# Patient Record
Sex: Female | Born: 1955
Health system: Southern US, Community
[De-identification: ages and names within clinical notes are randomized; demographics above are authoritative.]

## PROBLEM LIST (undated history)

## (undated) DIAGNOSIS — E039 Hypothyroidism, unspecified: Secondary | ICD-10-CM

## (undated) DIAGNOSIS — Z923 Personal history of irradiation: Secondary | ICD-10-CM

## (undated) DIAGNOSIS — M1990 Unspecified osteoarthritis, unspecified site: Secondary | ICD-10-CM

## (undated) DIAGNOSIS — G709 Myoneural disorder, unspecified: Secondary | ICD-10-CM

## (undated) DIAGNOSIS — T451X5A Adverse effect of antineoplastic and immunosuppressive drugs, initial encounter: Secondary | ICD-10-CM

## (undated) DIAGNOSIS — E876 Hypokalemia: Secondary | ICD-10-CM

## (undated) DIAGNOSIS — E119 Type 2 diabetes mellitus without complications: Secondary | ICD-10-CM

## (undated) DIAGNOSIS — D6481 Anemia due to antineoplastic chemotherapy: Secondary | ICD-10-CM

## (undated) DIAGNOSIS — G629 Polyneuropathy, unspecified: Secondary | ICD-10-CM

## (undated) DIAGNOSIS — T7840XA Allergy, unspecified, initial encounter: Secondary | ICD-10-CM

## (undated) DIAGNOSIS — F419 Anxiety disorder, unspecified: Secondary | ICD-10-CM

## (undated) DIAGNOSIS — Z800 Family history of malignant neoplasm of digestive organs: Secondary | ICD-10-CM

## (undated) DIAGNOSIS — Z9221 Personal history of antineoplastic chemotherapy: Secondary | ICD-10-CM

## (undated) DIAGNOSIS — C50919 Malignant neoplasm of unspecified site of unspecified female breast: Secondary | ICD-10-CM

## (undated) DIAGNOSIS — Z9289 Personal history of other medical treatment: Secondary | ICD-10-CM

## (undated) HISTORY — PX: DILATION AND CURETTAGE OF UTERUS: SHX78

## (undated) HISTORY — DX: Adverse effect of antineoplastic and immunosuppressive drugs, initial encounter: D64.81

## (undated) HISTORY — DX: Allergy, unspecified, initial encounter: T78.40XA

## (undated) HISTORY — DX: Hypokalemia: E87.6

## (undated) HISTORY — DX: Myoneural disorder, unspecified: G70.9

## (undated) HISTORY — PX: WISDOM TOOTH EXTRACTION: SHX21

## (undated) HISTORY — PX: BREAST LUMPECTOMY: SHX2

## (undated) HISTORY — DX: Anemia due to antineoplastic chemotherapy: T45.1X5A

## (undated) HISTORY — DX: Personal history of irradiation: Z92.3

## (undated) HISTORY — DX: Family history of malignant neoplasm of digestive organs: Z80.0

## (undated) HISTORY — PX: DIAGNOSTIC LAPAROSCOPY: SUR761

---
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
## 1998-03-18 ENCOUNTER — Ambulatory Visit (HOSPITAL_COMMUNITY): Admission: RE | Admit: 1998-03-18 | Discharge: 1998-03-18 | Payer: Self-pay | Admitting: Gynecology

## 1998-03-25 ENCOUNTER — Ambulatory Visit (HOSPITAL_COMMUNITY): Admission: RE | Admit: 1998-03-25 | Discharge: 1998-03-25 | Payer: Self-pay | Admitting: Gynecology

## 1998-05-23 ENCOUNTER — Ambulatory Visit (HOSPITAL_COMMUNITY): Admission: RE | Admit: 1998-05-23 | Discharge: 1998-05-23 | Payer: Self-pay | Admitting: Gynecology

## 1998-07-01 ENCOUNTER — Ambulatory Visit (HOSPITAL_COMMUNITY): Admission: RE | Admit: 1998-07-01 | Discharge: 1998-07-01 | Payer: Self-pay | Admitting: Gynecology

## 1998-11-21 ENCOUNTER — Other Ambulatory Visit: Admission: RE | Admit: 1998-11-21 | Discharge: 1998-11-21 | Payer: Self-pay | Admitting: Obstetrics and Gynecology

## 1999-03-30 ENCOUNTER — Ambulatory Visit (HOSPITAL_COMMUNITY): Admission: RE | Admit: 1999-03-30 | Discharge: 1999-03-30 | Payer: Self-pay | Admitting: Obstetrics and Gynecology

## 1999-03-30 ENCOUNTER — Encounter: Payer: Self-pay | Admitting: Obstetrics and Gynecology

## 1999-04-06 ENCOUNTER — Inpatient Hospital Stay (HOSPITAL_COMMUNITY): Admission: AD | Admit: 1999-04-06 | Discharge: 1999-04-06 | Payer: Self-pay | Admitting: Obstetrics and Gynecology

## 1999-04-07 ENCOUNTER — Ambulatory Visit (HOSPITAL_COMMUNITY): Admission: RE | Admit: 1999-04-07 | Discharge: 1999-04-07 | Payer: Self-pay | Admitting: Obstetrics and Gynecology

## 1999-04-07 ENCOUNTER — Encounter: Payer: Self-pay | Admitting: Obstetrics and Gynecology

## 1999-05-04 ENCOUNTER — Inpatient Hospital Stay (HOSPITAL_COMMUNITY): Admission: AD | Admit: 1999-05-04 | Discharge: 1999-05-08 | Payer: Self-pay | Admitting: *Deleted

## 1999-05-11 ENCOUNTER — Encounter: Admission: RE | Admit: 1999-05-11 | Discharge: 1999-08-09 | Payer: Self-pay | Admitting: Obstetrics and Gynecology

## 1999-06-19 ENCOUNTER — Other Ambulatory Visit: Admission: RE | Admit: 1999-06-19 | Discharge: 1999-06-19 | Payer: Self-pay | Admitting: Obstetrics and Gynecology

## 1999-08-16 ENCOUNTER — Encounter: Admission: RE | Admit: 1999-08-16 | Discharge: 1999-11-14 | Payer: Self-pay | Admitting: Obstetrics and Gynecology

## 2000-01-24 ENCOUNTER — Encounter: Admission: RE | Admit: 2000-01-24 | Discharge: 2000-01-24 | Payer: Self-pay | Admitting: Gynecology

## 2000-01-24 ENCOUNTER — Encounter: Payer: Self-pay | Admitting: Gynecology

## 2001-02-25 ENCOUNTER — Other Ambulatory Visit: Admission: RE | Admit: 2001-02-25 | Discharge: 2001-02-25 | Payer: Self-pay | Admitting: Obstetrics and Gynecology

## 2001-03-09 ENCOUNTER — Other Ambulatory Visit: Admission: RE | Admit: 2001-03-09 | Discharge: 2001-03-09 | Payer: Self-pay | Admitting: Obstetrics and Gynecology

## 2002-04-21 ENCOUNTER — Other Ambulatory Visit: Admission: RE | Admit: 2002-04-21 | Discharge: 2002-04-21 | Payer: Self-pay | Admitting: Obstetrics and Gynecology

## 2005-01-15 ENCOUNTER — Other Ambulatory Visit: Admission: RE | Admit: 2005-01-15 | Discharge: 2005-01-15 | Payer: Self-pay | Admitting: Obstetrics and Gynecology

## 2010-06-17 ENCOUNTER — Encounter: Payer: Self-pay | Admitting: Obstetrics and Gynecology

## 2011-05-28 HISTORY — PX: COLONOSCOPY: SHX174

## 2012-05-27 HISTORY — PX: BREAST BIOPSY: SHX20

## 2013-04-13 DIAGNOSIS — Z124 Encounter for screening for malignant neoplasm of cervix: Secondary | ICD-10-CM | POA: Insufficient documentation

## 2013-04-19 ENCOUNTER — Other Ambulatory Visit: Payer: Self-pay | Admitting: Obstetrics and Gynecology

## 2013-04-19 DIAGNOSIS — R928 Other abnormal and inconclusive findings on diagnostic imaging of breast: Secondary | ICD-10-CM

## 2013-04-26 ENCOUNTER — Ambulatory Visit
Admission: RE | Admit: 2013-04-26 | Discharge: 2013-04-26 | Disposition: A | Discharge status: Home / Self Care | Payer: BC Managed Care – PPO | Source: Ambulatory Visit | Attending: Obstetrics and Gynecology | Admitting: Obstetrics and Gynecology

## 2013-04-26 ENCOUNTER — Other Ambulatory Visit: Payer: Self-pay | Admitting: Obstetrics and Gynecology

## 2013-04-26 DIAGNOSIS — R928 Other abnormal and inconclusive findings on diagnostic imaging of breast: Secondary | ICD-10-CM

## 2013-06-03 ENCOUNTER — Ambulatory Visit
Admission: RE | Admit: 2013-06-03 | Discharge: 2013-06-03 | Disposition: A | Discharge status: Home / Self Care | Payer: BC Managed Care – PPO | Source: Ambulatory Visit | Attending: Obstetrics and Gynecology | Admitting: Obstetrics and Gynecology

## 2013-06-03 ENCOUNTER — Other Ambulatory Visit: Payer: Self-pay | Admitting: Obstetrics and Gynecology

## 2013-06-03 DIAGNOSIS — R928 Other abnormal and inconclusive findings on diagnostic imaging of breast: Secondary | ICD-10-CM

## 2013-06-03 DIAGNOSIS — C50919 Malignant neoplasm of unspecified site of unspecified female breast: Secondary | ICD-10-CM | POA: Insufficient documentation

## 2013-06-03 HISTORY — DX: Malignant neoplasm of unspecified site of unspecified female breast: C50.919

## 2013-06-04 ENCOUNTER — Other Ambulatory Visit: Payer: Self-pay | Admitting: Obstetrics and Gynecology

## 2013-06-04 DIAGNOSIS — C801 Malignant (primary) neoplasm, unspecified: Secondary | ICD-10-CM

## 2013-06-08 ENCOUNTER — Ambulatory Visit
Admission: RE | Admit: 2013-06-08 | Discharge: 2013-06-08 | Disposition: A | Discharge status: Home / Self Care | Payer: BC Managed Care – PPO | Source: Ambulatory Visit | Attending: Obstetrics and Gynecology | Admitting: Obstetrics and Gynecology

## 2013-06-08 DIAGNOSIS — C801 Malignant (primary) neoplasm, unspecified: Secondary | ICD-10-CM

## 2013-06-08 MED ORDER — GADOBENATE DIMEGLUMINE 529 MG/ML IV SOLN
20.0000 mL | Freq: Once | INTRAVENOUS | Status: AC | PRN
  Administered 2013-06-08: 20 mL via INTRAVENOUS

## 2013-06-11 ENCOUNTER — Encounter (INDEPENDENT_AMBULATORY_CARE_PROVIDER_SITE_OTHER): Payer: Self-pay | Admitting: Surgery

## 2013-06-11 ENCOUNTER — Other Ambulatory Visit (INDEPENDENT_AMBULATORY_CARE_PROVIDER_SITE_OTHER): Payer: Self-pay

## 2013-06-11 ENCOUNTER — Ambulatory Visit (INDEPENDENT_AMBULATORY_CARE_PROVIDER_SITE_OTHER): Payer: BC Managed Care – PPO | Admitting: Surgery

## 2013-06-11 ENCOUNTER — Encounter (INDEPENDENT_AMBULATORY_CARE_PROVIDER_SITE_OTHER): Payer: Self-pay

## 2013-06-11 VITALS — BP 142/88 | HR 107 | Temp 98.0°F | Resp 18 | Ht 68.0 in | Wt 218.0 lb

## 2013-06-11 DIAGNOSIS — D0590 Unspecified type of carcinoma in situ of unspecified breast: Secondary | ICD-10-CM

## 2013-06-11 DIAGNOSIS — C50919 Malignant neoplasm of unspecified site of unspecified female breast: Secondary | ICD-10-CM

## 2013-06-11 DIAGNOSIS — C50912 Malignant neoplasm of unspecified site of left female breast: Secondary | ICD-10-CM | POA: Insufficient documentation

## 2013-06-11 NOTE — Progress Notes (Addendum)
Re:   Lori Vincent DOB:   1956/03/12 MRN:   [id number]  ASSESSMENT AND PLAN: 1.  Left breast cancer, 6 o'clock (T2, N0)  Invasive ductal carcinoma on biopsy, ER - 97%, PR - 75%, Ki67 - 44%, Her2Neu - Positive  MRI - 06/08/2013 - 2.9 x 2.1 x1.9 cm  I discussed the options for breast cancer treatment with the patient.  I discussed a multidisciplinary approach to the treatment of breast cancer, which includes medical oncology and radiation oncology.  I discussed the surgical options of lumpectomy vs. mastectomy.  If mastectomy, there is the possibility of reconstruction.   I discussed the options of lymph node biopsy.  The treatment plan depends on the pathologic staging of the tumor and the patient's personal wishes.  The risks of surgery include, but are not limited to, bleeding, infection, the need for further surgery, and nerve injury.  The patient has been given literature on the treatment of breast cancer.  Since she is Her2Neu positive - I want her to see medical oncology first.  The patient has requested Dr. Jana Hakim.  Will also have her see radiation oncology, so she has a complete picture of her planned treatment.  There are a lot of overlying issues/questions since her husband died of metastatic sarcoma.  Plan: 1) Medical oncology consult to confirm planned chemotherapy, 2) Lumpectomy (for wire loc-requested Dr. Joneen Caraway if possible), left axillary sentinel lymph node biopsy, power port placement, 3) Chemotherapy (this could be done as neoadjuvant if everyone thought that is in her best interest), 4) Radiation therapy. [Seen by Dr. Glendora Score. [name] - 06/24/2013  DN] Lori Vincent is going to  [place] with a friend on 2/11 - so we have moved her surgery to 07/26/2013.  Discussed with patient.  DN 07/01/2013]  1A.  Probable chemotherapy - I discussed power port placement at the same time as the lumpectomy.  The risks include bleeding, infection, pneumothorax, and thrombosis.  2.  On thyroid replacement 3.   History of depression Lori Vincent has some moles below her right breast that she wants removed - I can do that at the same time as her breast surgery. DN  06/23/2013]  Chief Complaint  Patient presents with  . New Evaluation    left br ca   REFERRING PHYSICIAN: [name], NP  HISTORY OF PRESENT ILLNESS: Lori Vincent is a 58 y.o. (DOB: [birth year])  white  female whose primary care physician is CURTIS,CAROL G, NP and comes to me today for left breast cancer. She comes by herself.  [name] went for her annual mammogram on 04/26/2013.  She also had an [place] on 04/26/2013.  The report showed "two adjacent suspicious masses in the 6 o'clock position of the left breast."  For financial reasons, the patient waited until 06/04/2013 to have a biopsy of her left breast.  The path report of the left breast biopsy (Accession: [id number]) - 06/03/2013 - Invasive ductal carcinoma with DCIS.  She is not on hormone.  Her last period was about 1 year ago.  She has no family history of breast cancer.  She could not feel this mass, even after it was found on mammogram/US.   MRI - 06/08/2013 - shows Irregular enhancing mass within the left breast compatible with  recently biopsy proven invasive ductal carcinoma. Extending along the posterior medial margin of this mass are 2 adjacent irregular masses when measured in total extending 2.9 cm. As these masses are  immediately adjacent to each other, they  likely can be included in single surgical specimen.   No past medical history on file.   Past Surgical History  Procedure Laterality Date  . Tonsillectomy  1972  . Cesarean section  04/1999     Current Outpatient Prescriptions  Medication Sig Dispense Refill  . citalopram (CELEXA) 10 MG tablet       . etodolac (LODINE) 400 MG tablet Take 400 mg by mouth 2 (two) times daily.      Marland Kitchen SYNTHROID 125 MCG tablet        No current facility-administered medications for this visit.     Allergies  Allergen Reactions  .  Aloe    REVIEW OF SYSTEMS: Skin:  No history of rash.  No history of abnormal moles. Infection:  No history of hepatitis or HIV.  No history of MRSA. Neurologic:  No history of stroke.  No history of seizure.  No history of headaches. Cardiac:  No history of hypertension. No history of heart disease.  No history of prior cardiac catheterization.  No history of seeing a cardiologist. Pulmonary:  Does not smoke cigarettes.  No asthma or bronchitis.  No OSA/CPAP.  Endocrine:  No diabetes. Hypothyroid - has seen Dr. Kristeen Mans recently to adjust her thyroid doses. Gastrointestinal:  No history of stomach disease.  No history of liver disease.  No history of gall bladder disease.  No history of pancreas disease.  No history of colon disease. Urologic:  No history of kidney stones.  No history of bladder infections. Musculoskeletal:  No history of joint or back disease. Hematologic:  No bleeding disorder.  No history of anemia.  Not anticoagulated. Psycho-social:  The patient is oriented.   The patient has no obvious psychologic or social impairment to understanding our conversation and plan.  SOCIAL and FAMILY HISTORY: Husband died July 23, 2009 secondary to leg sarcoma.  He was taken care over at [facility]. She has one daughter age 56.  Has two step children at college. 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    PHYSICAL EXAM: BP 142/88  Pulse 107  Temp(Src) 98 F (36.7 C)  Resp 18  Ht _0  (1.727 m)  Wt 218 lb (98.884 kg)  BMI 33.15 kg/m2  General: WN WF  who is alert and generally healthy appearing.  HEENT: Normal. Pupils equal. Neck: Supple. No mass.  No thyroid mass. Lymph Nodes:  No supraclavicular, cervical, or axillary adenopathy. Breasts:  Right - no mass  Left - bruise at 6 o'clock.  No mass. Lungs: Clear to auscultation and symmetric breath sounds. Heart:  RRR. No murmur or rub. Abdomen: Soft. No mass. No tenderness. No hernia. Normal bowel sounds.  No abdominal  scars. Rectal: Not done. Extremities:  Good strength and ROM  in upper and lower extremities. Neurologic:  Grossly intact to motor and sensory function. Psychiatric: Has normal mood and affect. Behavior is normal.   DATA REVIEWED: Notes in Epic.  [name], MD,  [facility], [address]  [place], [place]    [place] Phone:  [phone] FAX:  [phone]

## 2013-06-14 ENCOUNTER — Telehealth: Payer: Self-pay | Admitting: *Deleted

## 2013-06-14 ENCOUNTER — Encounter: Payer: Self-pay | Admitting: *Deleted

## 2013-06-14 NOTE — Telephone Encounter (Signed)
Pt returned my call and I confirmed 06/15/13 appt w/ her.  Could not schedule Dr. Virgie Dad appt - emailed Audie Clear to enter it.  Emailed Glenda at [facility] to make her aware.  Made chart.

## 2013-06-14 NOTE — Telephone Encounter (Signed)
Received an appt from Dr. Jana Hakim.  Called and left message for pt to return my call so I can get her scheduled.

## 2013-06-14 NOTE — Progress Notes (Signed)
Completed chart, added to spreadsheet and placed in Dr. Virgie Dad box.

## 2013-06-15 ENCOUNTER — Ambulatory Visit: Payer: BC Managed Care – PPO

## 2013-06-15 ENCOUNTER — Ambulatory Visit (HOSPITAL_BASED_OUTPATIENT_CLINIC_OR_DEPARTMENT_OTHER): Payer: BC Managed Care – PPO | Admitting: Oncology

## 2013-06-15 ENCOUNTER — Encounter: Payer: Self-pay | Admitting: Oncology

## 2013-06-15 VITALS — BP 168/89 | HR 97 | Temp 98.4°F | Resp 20 | Ht 68.0 in | Wt 218.2 lb

## 2013-06-15 DIAGNOSIS — Z170 Estrogen receptor positive status [ER+]: Secondary | ICD-10-CM

## 2013-06-15 DIAGNOSIS — C50512 Malignant neoplasm of lower-outer quadrant of left female breast: Secondary | ICD-10-CM

## 2013-06-15 DIAGNOSIS — C50919 Malignant neoplasm of unspecified site of unspecified female breast: Secondary | ICD-10-CM

## 2013-06-15 NOTE — Progress Notes (Signed)
Checked in new patient with no financial issues. She has appt card. °

## 2013-06-18 ENCOUNTER — Encounter: Payer: Self-pay | Admitting: Radiation Oncology

## 2013-06-19 DIAGNOSIS — C50512 Malignant neoplasm of lower-outer quadrant of left female breast: Secondary | ICD-10-CM | POA: Insufficient documentation

## 2013-06-19 DIAGNOSIS — Z170 Estrogen receptor positive status [ER+]: Secondary | ICD-10-CM

## 2013-06-19 NOTE — Progress Notes (Signed)
[place]  Telephone:(336) [phone] Fax:(336) [phone]     ID: Lori Vincent OB: Apr 26, 1956  MR#: [id number]  QJJ#:[id number]  PCP: [name], NP GYN:  [name] SU: Lori Vincent OTHER MD: Arloa Koh  CHIEF COMPLAINT: "They found I had breast cancer".  HISTORY OF PRESENT ILLNESS: L[facility] Vincent [facility] showing a possible mass in the left breast. Diagnostic left mammography and ultrasonography at the breast center 04/26/2013 confirmed an oval mass with microcalcifications in the 6:00 position of the left breast. There was an adjacent partially obscured 1.2 cm mass. On physical exam there was no palpable mass in the left breast. By ultrasonography, there was an oval mass at the 6:00 position measuring 1.4 cm, with a second mass measuring 1.1 cm. There were no abnormal lymph nodes noted in the left axilla.  Biopsy of the 6:00 mass in the left breast January 2015 showed ([id number]) and invasive ductal carcinoma, grade 3, estrogen receptor 97% positive, progesterone receptor 75% positive, with strong staining intensity, and MIB-144%. HER-2 was amplified, with the signals ratio 4.18 and in number per cell of 9.40 .  On 06/08/2013 the patient underwent bilateral breast MRI which showed a dumbbell-shaped area in the central left breast consisting of a 2 cm mass and 2 extensions or an adjacent masses, measuring a total of 2.9 cm. There were no abnormal appearing lymph nodes for any other areas of concern.  The patient's subsequent history is as detailed below.   INTERVAL HISTORY: Lori Vincent was evaluated in the breast clinic 06/15/2013. Her case was also discussed in the multidisciplinary breast cancer conference 06/09/2013  REVIEW OF SYSTEMS: There were no specific symptoms leading to the original mammogram, which was routinely scheduled. The patient denies unusual headaches, visual changes, nausea, vomiting, stiff  neck, dizziness, or gait imbalance. There has been no cough, phlegm production, or pleurisy, no chest pain or pressure, and no change in bowel or bladder habits. The patient denies fever, rash, bleeding, unexplained fatigue or unexplained weight loss. She does complain of mild insomnia, mild hot flashes, and mild arthritis symptoms. No these problems are more intense or frequent than before. A detailed review of systems was otherwise entirely negative.  PAST MEDICAL HISTORY: Past Medical History  Diagnosis Date  . Breast cancer 06/03/13    left    PAST SURGICAL HISTORY: Past Surgical History  Procedure Laterality Date  . Tonsillectomy  1972  . Cesarean section  04/1999    FAMILY HISTORY Family History  Problem Relation Age of Onset  . Stroke Mother   . Diabetes Father   . Cancer Maternal Grandmother     colon   the patient's father died at the age of 26 in a car accident. The patient's mother died at the age of 7 from a stroke. The patient has one adopted brother. She has one biological sister. The patient's mother's mother was diagnosed with colon cancer at the age of 14. There is no history of breast oropharynx cancer in the family to the patient's knowledge.  GYNECOLOGIC HISTORY:  Menarche age 55. First live birth age 61. The patient is GX P1. She took birth control pills for approximately 12 years remotely. She had a period in February of 2014 and then in December of 2014. She had been started on hormone replacement in December. That has since been discontinued.  SOCIAL HISTORY:  Lori Vincent used to do well a. 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 Her husband died at [place] 3 years ago with soft tissue sarcoma. The patient significant other is the past. He works as a Geophysicist/field seismologist. At home it is just the patient and her daughter 77, 46 years old. The patient has 2 stepchildren from her earlier marriage. 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  ADVANCED DIRECTIVES: Not in place   HEALTH MAINTENANCE: History  Substance Use Topics  . Smoking status: Never Smoker   . Smokeless tobacco: Not on file  . Alcohol Use: Not on file     Colonoscopy: July 2014  PAP: November 2014  Bone density: November 2014  Lipid panel:  Allergies  Allergen Reactions  . Aloe   . No Known Drug Allergy     Current Outpatient Prescriptions  Medication Sig Dispense Refill  . citalopram (CELEXA) 10 MG tablet       . etodolac (LODINE) 400 MG tablet Take 400 mg by mouth 2 (two) times daily.      . Multiple Vitamins-Minerals (CENTRUM SILVER PO) Take 1 tablet by mouth daily.      Marland Kitchen SYNTHROID 125 MCG tablet        No current facility-administered medications for this visit.    OBJECTIVE: Middle-aged white woman in no acute distress Filed Vitals:   06/15/13 1650  BP: 168/89  Pulse: 97  Temp: 98.4 F (36.9 C)  Resp: 20     Body mass index is 33.18 kg/(m^2).    ECOG FS:0 - Asymptomatic  Ocular: Sclerae unicteric, pupils equal, round and reactive to light Ear-nose-throat: Oropharynx clear, no thrush or other lesions Lymphatic: No cervical or supraclavicular adenopathy Lungs no rales or rhonchi, good excursion bilaterally Heart regular rate and rhythm, no murmur appreciated Abd soft, nontender, positive bowel sounds MSK no focal spinal tenderness, no joint edema Neuro: non-focal, well-oriented, appropriate affect Breasts: The right breast is unremarkable. The left breast is status post recent biopsy. There is a mild ecchymosis. I do not palpate a well-defined mass. The left axilla is benign.   LAB RESULTS:  CMP  No results found for this basename: na, k, cl, co2, glucose, bun, creatinine, calcium, prot, albumin, ast, alt, alkphos, bilitot, gfrnonaa, gfraa    I No results found for this basename: SPEP, UPEP,  kappa and lambda light chains    No results found for  this basename: WBC, NEUTROABS, HGB, HCT, MCV, PLT      Chemistry   No results found for this basename: NA, K, CL, CO2, BUN, CREATININE, GLU   No results found for this basename: CALCIUM, ALKPHOS, AST, ALT, BILITOT       No results found for this basename: LABCA2    No components found with this basename: LABCA125    No results found for this basename: INR,  in the last 168 hours  Urinalysis No results found for this basename: colorurine, appearanceur, labspec, phurine, glucoseu, hgbur, bilirubinur, ketonesur, proteinur, urobilinogen, nitrite, leukocytesur    STUDIES: Mr Breast Bilateral W Wo Contrast  06/08/2013   CLINICAL DATA:  Patient with recent diagnosis of left breast invasive ductal carcinoma and DCIS.  EXAM: BILATERAL BREAST MRI WITH AND WITHOUT CONTRAST  LABS:  BUN and creatinine were obtained on site at [facility] at  [address]  Results:  BUN 16 mg/dL,  Creatinine 0.9 mg/dL.  TECHNIQUE: Multiplanar, multisequence MR images of both breasts were obtained prior to and  following the intravenous administration of 88m of MultiHance.  THREE-DIMENSIONAL MR IMAGE RENDERING ON INDEPENDENT WORKSTATION:  Three-dimensional MR images were rendered by post-processing of the original MR data on an independent workstation. The three-dimensional MR images were interpreted, and findings are reported in the following complete MRI report for this study. Three dimensional images were evaluated at the independent DynaCad workstation  COMPARISON:  Previous exams  FINDINGS: Breast composition: a. Almost entirely fat  Background parenchymal enhancement: Mild  Right breast: No mass or abnormal enhancement.  Left breast: Within the central left breast there is a 1.2 x 1.2 x 2.0 cm enhancing mass containing a biopsy marking clip compatible with recently biopsied invasive ductal carcinoma/DCIS. Extending along the posterior medial margin of this biopsied mass are 2 adjacent masses. When these  are measured in total, the mass conglomerate measures 2.9 x 2.1 x 1.9 cm. No additional areas of concerning enhancement identified within the left breast.  Lymph nodes: No abnormal appearing lymph nodes.  Ancillary findings:  None.  IMPRESSION: Irregular enhancing mass within the left breast compatible with recently biopsy proven invasive ductal carcinoma. Extending along the posterior medial margin of this mass are 2 adjacent irregular masses when measured in total extending 2.9 cm. As these masses are immediately adjacent to each other, they likely can be included in single surgical specimen.  RECOMMENDATION: Treatment plan for known left breast malignancy.  BI-RADS CATEGORY  6: Known biopsy-proven malignancy - appropriate action should be taken.   Electronically Signed   By: DLovey NewcomerM.D.   On: 06/08/2013 12:19   Mm Digital Diagnostic Unilat L  06/03/2013   CLINICAL DATA:  Status post ultrasound-guided core needle biopsy of a 1.8 cm mass in the 6 o'clock position of the left breast.  EXAM: DIAGNOSTIC LEFT MAMMOGRAM POST ULTRASOUND BIOPSY  COMPARISON:  Previous exams  FINDINGS: Mammographic images were obtained following ultrasound guided biopsy of the 1.8 cm mass seen in the 6 o'clock position of the left breast at recent mammography and ultrasound. These demonstrate a top hat shaped biopsy marker clip within the biopsied mass.  IMPRESSION: Appropriate clip deployment following left breast ultrasound-guided core needle biopsy.  Final Assessment: Post Procedure Mammograms for Marker Placement   Electronically Signed   By: SEnrique SackM.D.   On: 06/03/2013 11:25   UKoreaLt Breast Bx W Loc Dev 1st Lesion Img Bx Spec UKoreaGuide  06/04/2013   ADDENDUM REPORT: 06/04/2013 13:09  ADDENDUM: I spoke with the patient on June 04, 2013 to relay pathology results of recent core needle biopsy. Pathology results demonstrated invasive ductal carcinoma within the biopsied larger left breast mass. Patient reports there is mild  bruising at the biopsy site. I also called the results to the referring physician's office, Dr. HMatthew Saras  Recommendations:  1. Surgical consultation for recently diagnosed left breast carcinoma. Patient has a surgical appointment with Dr. NLucia Gaskinson 06/11/2013 at 1415 hr. 2. Patient is scheduled for breast MRI 06/08/2013 at 0930 hr. 3. Note the adjacent smaller hypoechoic suspicious mass has not been biopsied however is immediately adjacent to the recently biopsied small breast cancer. Recommend attention to diagnostic MRI and this mass may be able to be excised at time of lumpectomy. Otherwise, consider ultrasound-guided core needle biopsy of this mass if treatment plan deems necessary.   Electronically Signed   By: DLovey NewcomerM.D.   On: 06/04/2013 13:09   06/04/2013   CLINICAL DATA:  Two adjacent suspicious masses in the 6 o'clock position  of the left breast at recent mammography and ultrasound.  EXAM: ULTRASOUND GUIDED LEFT BREAST CORE NEEDLE BIOPSY WITH VACUUM ASSIST  COMPARISON:  Previous exams.  PROCEDURE: I met with the patient and we discussed the procedure of ultrasound-guided biopsy, including benefits and alternatives. We discussed the high likelihood of a successful procedure. We discussed the risks of the procedure including infection, bleeding, tissue injury, clip migration, and inadequate sampling. Informed written consent was given. The usual time-out protocol was performed immediately prior to the procedure.  Preliminary ultrasound examination of the left breast demonstrated a bilobed hypoechoic mass with a thin connecting waist in the 6 o'clock position of the left breast, 1-2 cm from the nipple. This mass measured 1.8 cm in maximum diameter. There was a suggestion of a poorly defined hypoechoic mass 4 to 7 mm more inferiorly and posteriorly. This smaller, poorly defined hypoechoic area measured 7 x 6 x 5 mm in maximum dimensions. Together, the 1.8 cm mass and 7 mm mass like area span an area  measuring 2.6 cm in maximum diameter.  Using sterile technique and 2% Lidocaine as local anesthetic, under direct ultrasound visualization, a 12 gauge vacuum-assisteddevice was used to perform biopsy of the 1.8 cm mass in the 6 o'clock position of the left breast using an inferolateral approach. At the conclusion of the procedure, a top hat shaped tissue marker clip was deployed into the biopsy cavity. Follow-up 2-view mammogram was performed and dictated separately.  Following the biopsy of the 1.8 cm mass, the adjacent 7 mm poorly defined mass like area could not be visualized. Therefore, this was not biopsied.  IMPRESSION: Ultrasound-guided biopsy of a 1.8 cm mass in the 6 o'clock position of the left breast. No apparent complications.  Electronically Signed: By: Enrique Sack M.D. On: 06/03/2013 11:24    ASSESSMENT: 58 y.o. [place] woman, status post left breast biopsy 06/03/2013 for a clinical T2 N0, stage IIA invasive ductal carcinoma, grade 3, estrogen and progesterone receptor positive, with an MIB-1 of 44%, and HER-2 amplified.  PLAN: We spent the better part of today's hour-long appointment discussing the biology of breast cancer in general, and the specifics of the patient's tumor in particular. Sharlie understands there is no survival advantage to mastectomy as opposed to lumpectomy with radiation. She appears to be a good breast conservation candidate, and accordingly I see no obvious indication for neoadjuvant chemotherapy.  She is "triple positive" and she understands this is very favorable. It means that in addition to the one third risk reduction from chemotherapy she will halve her residual risk with anti-HER-2 treatment and halve iit again with antiestrogen pills. The Vincent plan, then will be surgery with port placement, to be followed by chemotherapy, to be followed by radiation, to be followed by anti-estrogens. She understands the anti-HER-2 treatment will overlap with chemotherapy  and continue for one year.  I believe Shiree would be a good candidate for our [place] study and I have alerted our research nurses regarding that possibility.   the patient has a good understanding of the Vincent plan, and is in agreement with it. She knows a goal of treatment in her cases cure. She will call with any problems that may develop before her next visit here.  Chauncey Cruel, MD   06/19/2013 3:06 PM

## 2013-06-21 ENCOUNTER — Encounter: Payer: Self-pay | Admitting: Radiation Oncology

## 2013-06-21 ENCOUNTER — Telehealth (INDEPENDENT_AMBULATORY_CARE_PROVIDER_SITE_OTHER): Payer: Self-pay | Admitting: *Deleted

## 2013-06-21 ENCOUNTER — Telehealth: Payer: Self-pay | Admitting: Oncology

## 2013-06-21 NOTE — Telephone Encounter (Signed)
Patient called to report that she has seen Dr. Jana Hakim so wants to know what the plan is moving forward.  She states she will have to work around her work schedule so just wondering what this will look like. I explained that I will send something to Dr. Lucia Gaskins to ask him then we will let her know as soon as we hear back from him.

## 2013-06-21 NOTE — Progress Notes (Addendum)
Location of Breast Cancer: left 6:00 o'clock  Histology per Pathology Report:  06/04/13 Diagnosis Breast, left, needle core biopsy, mass, 6 o'clock - INVASIVE DUCTAL CARCINOMA, SEE COMMENT. - DUCTAL CARCINOMA IN SITU. - CALCIFICATIONS PRESENT.  Receptor Status: ER(97%), PR (75%), Her2-neu (+)  Did patient present with symptoms (if so, please note symptoms) or was this found on screening mammography?: screening mammogram  Past/Anticipated interventions by surgeon, if any: biopsy  Past/Anticipated interventions by medical oncology, if any: Chemotherapy - Dr Jana Hakim- [place] study, The overall plan, then will be surgery with port placement, to be followed by chemotherapy, to be followed by radiation, to be followed by anti-estrogens. She understands the anti-HER-2 treatment will overlap with chemotherapy and continue for one year.  Lymphedema issues, if any:  No  Pain issues, if any:  None  SAFETY ISSUES:  Prior radiation? no  Pacemaker/ICD? no  Possible current pregnancy? no  Is the patient on methotrexate? 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

## 2013-06-21 NOTE — Telephone Encounter (Signed)
lmonvm for pt re appt for 3/6 and mailed schedule.

## 2013-06-22 ENCOUNTER — Telehealth: Payer: Self-pay | Admitting: *Deleted

## 2013-06-22 NOTE — Telephone Encounter (Signed)
Pt called stating that she has been trying to get in touch with Dr. Pollie Friar nurse since last week to get her surgical surgery scheduled.  I see the note from [facility] yesterday in the system.  Told pt that I would give this information to [facility] [employer] - the navigator and have her to f/u with Dr. Lucia Gaskins and his nurse to see if we can get this moved along for her.

## 2013-06-24 ENCOUNTER — Telehealth: Payer: Self-pay | Admitting: Radiation Oncology

## 2013-06-24 ENCOUNTER — Encounter: Payer: Self-pay | Admitting: Radiation Oncology

## 2013-06-24 ENCOUNTER — Ambulatory Visit
Admission: RE | Admit: 2013-06-24 | Discharge: 2013-06-24 | Disposition: A | Discharge status: Home / Self Care | Payer: BC Managed Care – PPO | Source: Ambulatory Visit | Attending: Radiation Oncology | Admitting: Radiation Oncology

## 2013-06-24 ENCOUNTER — Other Ambulatory Visit (INDEPENDENT_AMBULATORY_CARE_PROVIDER_SITE_OTHER): Payer: Self-pay | Admitting: Surgery

## 2013-06-24 ENCOUNTER — Telehealth (INDEPENDENT_AMBULATORY_CARE_PROVIDER_SITE_OTHER): Payer: Self-pay | Admitting: Surgery

## 2013-06-24 VITALS — BP 166/90 | HR 83 | Temp 98.0°F | Resp 16 | Ht 68.0 in | Wt 218.6 lb

## 2013-06-24 DIAGNOSIS — Z124 Encounter for screening for malignant neoplasm of cervix: Secondary | ICD-10-CM

## 2013-06-24 DIAGNOSIS — C50912 Malignant neoplasm of unspecified site of left female breast: Secondary | ICD-10-CM

## 2013-06-24 DIAGNOSIS — E079 Disorder of thyroid, unspecified: Secondary | ICD-10-CM | POA: Insufficient documentation

## 2013-06-24 DIAGNOSIS — C50512 Malignant neoplasm of lower-outer quadrant of left female breast: Secondary | ICD-10-CM

## 2013-06-24 DIAGNOSIS — C50919 Malignant neoplasm of unspecified site of unspecified female breast: Secondary | ICD-10-CM | POA: Insufficient documentation

## 2013-06-24 DIAGNOSIS — Z79899 Other long term (current) drug therapy: Secondary | ICD-10-CM | POA: Insufficient documentation

## 2013-06-24 DIAGNOSIS — Z170 Estrogen receptor positive status [ER+]: Secondary | ICD-10-CM | POA: Insufficient documentation

## 2013-06-24 HISTORY — DX: Malignant neoplasm of unspecified site of unspecified female breast: C50.919

## 2013-06-24 NOTE — Addendum Note (Signed)
Encounter addended by: Heywood Footman, RN on: 06/24/2013  9:21 AM<BR>     Documentation filed: Problem List

## 2013-06-24 NOTE — Telephone Encounter (Signed)
Per nuc med no orders for nuc med injection no orders for needle localization in [facility]  Surgery scheduled for 07/05/13

## 2013-06-24 NOTE — Telephone Encounter (Signed)
Patient has 0730 appointment. Patient did not show to be arrived. Phoned patient and learned she was sitting in the lobby. Retrieved patient and began nurse eval.

## 2013-06-24 NOTE — Progress Notes (Signed)
See progress note under physician encounter. 

## 2013-06-24 NOTE — Progress Notes (Signed)
[facility] NEW PATIENT EVALUATION  Name: Lori Vincent MRN: [id number]  Date:   06/24/2013           DOB: [birth year]  Status: outpatient   CC: [name], NP  Shann Medal, MD    REFERRING PHYSICIAN: Shann Medal, MD   DIAGNOSIS:  Stage IIA (T2 N0 M0) invasive ductal/DCIS of the left breast  HISTORY OF PRESENT ILLNESS:  Lori Vincent is a 58 y.o. female who is seen today through the courtesy of Dr. Lucia Gaskins for evaluation of her T2 N0 invasive ductal/DCIS of the left breast. A screening mammogram on 04/13/2013 was suspicious for a new left breast mass. Spot compression views on 04/26/2013 showed an oval mass with microcalcifications in the 6:00 position. There was also an adjacent 1.2 cm mass. Ultrasound showed a mass with calcifications at 6:00, 1 cm from the left nipple measuring 1.3 x 0.9 x 1.4 cm with an adjacent hypoechoic mass measuring 1.1 x 1.0 x 0.6 cm. There were no abnormal lymph nodes. Biopsy on 06/03/2013 was diagnostic for invasive ductal carcinoma and DCIS. Invasive tumor was felt to be grade 3 and the DCIS was felt to be grade 2. The tumor was strongly ER positive at 97% and PR positive at 75% with an elevated proliferation Ki-67 of 44%. HER-2/neu was amplified.  Breast MR on 06/08/2013 showed an irregular enhancing mass with biopsy changes. One mass measured 1.2 x 1.2 x 2.0 cm and posterior and medial to this mass were 2 adjacent masses altogether measuring 2.9 x 2.1 x 1.9 cm. She was seen in consultation by Dr. [name] and also Dr. Tressa Busman. She remains quite anxious. She lost her husband 3 years ago to metastatic extremity sarcoma, she is concerned about her daughter's well being with respect to her recent diagnosis of breast cancer.  PREVIOUS RADIATION THERAPY: No   PAST MEDICAL HISTORY:  has a past medical history of Breast cancer (06/03/13) and Thyroid disease.     PAST SURGICAL HISTORY:  Past Surgical History   Procedure Laterality Date  . Tonsillectomy  1972  . Cesarean section  04/1999  . Breast biopsy       FAMILY HISTORY: family history includes Cancer (age of onset: 54) in her maternal grandmother; Diabetes in her father; Stroke in her mother. Her father died following a motor vehicle accident at 48. Her mother died following a stroke at 74. No family history of breast cancer.   SOCIAL HISTORY:  reports that she has never smoked. She has never used smokeless tobacco. She reports that she drinks alcohol. She reports that she does not use illicit drugs. Widowed, one daughter age 20 and 2 stepchildren. She works as a Environmental health practitioner.   ALLERGIES: Aloe and No known drug allergy   MEDICATIONS:  Current Outpatient Prescriptions  Medication Sig Dispense Refill  . citalopram (CELEXA) 10 MG tablet       . etodolac (LODINE) 400 MG tablet Take 400 mg by mouth 2 (two) times daily.      . Multiple Vitamins-Minerals (CENTRUM SILVER PO) Take 1 tablet by mouth daily.      Marland Kitchen SYNTHROID 125 MCG tablet        No current facility-administered medications for this encounter.     REVIEW OF SYSTEMS:  Pertinent items are noted in HPI.    PHYSICAL EXAM:  height is _0  (1.727 m) and weight is 218 lb 9.6 oz (99.156 kg). Her oral temperature  is 98 F (36.7 C). Her blood pressure is 166/90 and her pulse is 83. Her respiration is 16 and oxygen saturation is 100%.   Alert and oriented. Head and neck examination: Grossly unremarkable. Nodes: Without palpable cervical, supraclavicular, or axillary lymphadenopathy. Chest: Lungs clear. Back: Without spinal or CVA tenderness. Heart: Regular rate rhythm. Breasts: There is a punctate biopsy wound at 6:00 along left breast with some bruising at 5:00. No dominant masses are appreciated. Right breast without masses or lesions. Abdomen: Without hepatomegaly. Extremities: Without edema.   LABORATORY DATA:  No results found for this basename: WBC, HGB, HCT, MCV, PLT    No results found for this basename: NA, K, CL, CO2   No results found for this basename: ALT, AST, GGT, ALKPHOS, BILITOT      IMPRESSION: Clinical stage IIA (T2 N0 M0) invasive ductal/DCIS of the left breast. I explained to the patient that her local treatment options include mastectomy versus partial mastectomy followed by radiation therapy. She appears to be a candidate for breast preservation. She is scheduled for a left partial mastectomy and sentinel lymph node biopsy by Dr. Lucia Gaskins. She will also benefit from Herceptin based systemic therapy under the direction of Dr. Jana Hakim. She is considering protocol therapy as well. I explained to her that radiation therapy would follow systemic therapy and the treatment fields will depend on the surgical findings. She may benefit from deeper inspiration/breath-hold technology to avoid cardiac irradiation. I discussed the potential acute and late toxicities of radiation therapy. Based on her breast size, she may not be a candidate for hypo-fractionated radiation therapy. We may consider obtaining a preradiation therapy mammogram to confirm removal of all suspicious microcalcifications which almost certainly represents the DCIS component of her disease. I also offered her and her daughter counseling. I also gave her the name of Sigmund Hazel RN, our head breast navigator.   PLAN: As discussed above. I would like to see her for a followup visit near completion of chemotherapy.  I spent 40 minutes minutes face to face with the patient and more than 50% of that time was spent in counseling and/or coordination of care.

## 2013-06-25 ENCOUNTER — Telehealth (INDEPENDENT_AMBULATORY_CARE_PROVIDER_SITE_OTHER): Payer: Self-pay

## 2013-06-25 NOTE — Telephone Encounter (Signed)
Pt calling wanting to know if she will be able to fly to [facility] 2 days after her lumpectomy on 07-05-13. I advised her that we will have to forward this request to Dr Lucia Gaskins for review. Pt states she will not be traveling alone. Pt can be reached at [phone].

## 2013-06-29 ENCOUNTER — Telehealth (INDEPENDENT_AMBULATORY_CARE_PROVIDER_SITE_OTHER): Payer: Self-pay

## 2013-06-29 ENCOUNTER — Other Ambulatory Visit (INDEPENDENT_AMBULATORY_CARE_PROVIDER_SITE_OTHER): Payer: Self-pay | Admitting: Surgery

## 2013-06-29 DIAGNOSIS — C50919 Malignant neoplasm of unspecified site of unspecified female breast: Secondary | ICD-10-CM

## 2013-06-29 NOTE — Telephone Encounter (Signed)
Patient was transferred into triage regarding surgery date change.  Patient unable to keep surgery date on 07/05/13 due to work.  Patient spoke to [facility] in scheduling and was given a surgery date of 07/26/13.  Patient concerned that this date may be too far out and wants to make sure that it's okay to wait until march or if there's any way possible to work her surgery in during the week of February 17th.  Patient advised that I will forward message to Dr. Lucia Gaskins and Holley Raring and we will contact accordingly.  Patient verbalized understanding.  Patient can be contacted at [phone].

## 2013-07-05 ENCOUNTER — Ambulatory Visit (HOSPITAL_COMMUNITY): Payer: BC Managed Care – PPO

## 2013-07-20 ENCOUNTER — Encounter (HOSPITAL_BASED_OUTPATIENT_CLINIC_OR_DEPARTMENT_OTHER): Payer: Self-pay | Admitting: *Deleted

## 2013-07-20 NOTE — Progress Notes (Signed)
No labs needed per anesthesia 

## 2013-07-21 ENCOUNTER — Encounter (INDEPENDENT_AMBULATORY_CARE_PROVIDER_SITE_OTHER): Payer: BC Managed Care – PPO | Admitting: Surgery

## 2013-07-26 ENCOUNTER — Encounter (HOSPITAL_BASED_OUTPATIENT_CLINIC_OR_DEPARTMENT_OTHER): Payer: Self-pay | Admitting: Anesthesiology

## 2013-07-26 ENCOUNTER — Encounter (HOSPITAL_BASED_OUTPATIENT_CLINIC_OR_DEPARTMENT_OTHER): Payer: BC Managed Care – PPO | Admitting: Anesthesiology

## 2013-07-26 ENCOUNTER — Ambulatory Visit (HOSPITAL_COMMUNITY): Payer: BC Managed Care – PPO

## 2013-07-26 ENCOUNTER — Ambulatory Visit
Admission: RE | Admit: 2013-07-26 | Discharge: 2013-07-26 | Disposition: A | Discharge status: Home / Self Care | Payer: BC Managed Care – PPO | Source: Ambulatory Visit | Attending: Surgery | Admitting: Surgery

## 2013-07-26 ENCOUNTER — Other Ambulatory Visit (INDEPENDENT_AMBULATORY_CARE_PROVIDER_SITE_OTHER): Payer: Self-pay | Admitting: Surgery

## 2013-07-26 ENCOUNTER — Ambulatory Visit (HOSPITAL_BASED_OUTPATIENT_CLINIC_OR_DEPARTMENT_OTHER): Payer: BC Managed Care – PPO | Admitting: Anesthesiology

## 2013-07-26 ENCOUNTER — Encounter (HOSPITAL_BASED_OUTPATIENT_CLINIC_OR_DEPARTMENT_OTHER)
Admission: RE | Disposition: A | Discharge status: Home / Self Care | Payer: Self-pay | Source: Ambulatory Visit | Attending: Surgery

## 2013-07-26 ENCOUNTER — Ambulatory Visit (HOSPITAL_BASED_OUTPATIENT_CLINIC_OR_DEPARTMENT_OTHER)
Admission: RE | Admit: 2013-07-26 | Discharge: 2013-07-26 | Disposition: A | Discharge status: Home / Self Care | Payer: BC Managed Care – PPO | Source: Ambulatory Visit | Attending: Surgery | Admitting: Surgery

## 2013-07-26 ENCOUNTER — Ambulatory Visit (HOSPITAL_COMMUNITY)
Admission: RE | Admit: 2013-07-26 | Discharge: 2013-07-26 | Disposition: A | Discharge status: Home / Self Care | Payer: BC Managed Care – PPO | Source: Ambulatory Visit | Attending: Surgery | Admitting: Surgery

## 2013-07-26 DIAGNOSIS — C50919 Malignant neoplasm of unspecified site of unspecified female breast: Secondary | ICD-10-CM

## 2013-07-26 DIAGNOSIS — C50519 Malignant neoplasm of lower-outer quadrant of unspecified female breast: Secondary | ICD-10-CM | POA: Insufficient documentation

## 2013-07-26 DIAGNOSIS — C50912 Malignant neoplasm of unspecified site of left female breast: Secondary | ICD-10-CM

## 2013-07-26 DIAGNOSIS — L988 Other specified disorders of the skin and subcutaneous tissue: Secondary | ICD-10-CM | POA: Insufficient documentation

## 2013-07-26 DIAGNOSIS — D0590 Unspecified type of carcinoma in situ of unspecified breast: Secondary | ICD-10-CM | POA: Insufficient documentation

## 2013-07-26 DIAGNOSIS — D239 Other benign neoplasm of skin, unspecified: Secondary | ICD-10-CM | POA: Insufficient documentation

## 2013-07-26 DIAGNOSIS — C50512 Malignant neoplasm of lower-outer quadrant of left female breast: Secondary | ICD-10-CM

## 2013-07-26 DIAGNOSIS — F3289 Other specified depressive episodes: Secondary | ICD-10-CM | POA: Insufficient documentation

## 2013-07-26 DIAGNOSIS — F329 Major depressive disorder, single episode, unspecified: Secondary | ICD-10-CM | POA: Insufficient documentation

## 2013-07-26 HISTORY — DX: Hypothyroidism, unspecified: E03.9

## 2013-07-26 HISTORY — PX: BREAST LUMPECTOMY WITH NEEDLE LOCALIZATION AND AXILLARY SENTINEL LYMPH NODE BX: SHX5760

## 2013-07-26 HISTORY — PX: MOLE REMOVAL: SHX2046

## 2013-07-26 HISTORY — PX: PORTACATH PLACEMENT: SHX2246

## 2013-07-26 SURGERY — BREAST LUMPECTOMY WITH NEEDLE LOCALIZATION AND AXILLARY SENTINEL LYMPH NODE BX
Anesthesia: Regional | Site: Chest | Laterality: Right

## 2013-07-26 MED ORDER — BUPIVACAINE HCL (PF) 0.25 % IJ SOLN
INTRAMUSCULAR | Status: AC
  Filled 2013-07-26: qty 30

## 2013-07-26 MED ORDER — MIDAZOLAM HCL 2 MG/2ML IJ SOLN
INTRAMUSCULAR | Status: AC
Start: 2013-07-26 — End: 2013-07-26
  Filled 2013-07-26: qty 2

## 2013-07-26 MED ORDER — POVIDONE-IODINE 10 % EX OINT
TOPICAL_OINTMENT | CUTANEOUS | Status: AC
Start: 2013-07-26 — End: 2013-07-26
  Filled 2013-07-26: qty 28.35

## 2013-07-26 MED ORDER — HYDROMORPHONE HCL PF 1 MG/ML IJ SOLN
0.2500 mg | INTRAMUSCULAR | Status: DC | PRN
  Administered 2013-07-26: 0.5 mg via INTRAVENOUS

## 2013-07-26 MED ORDER — CEFAZOLIN SODIUM-DEXTROSE 2-3 GM-% IV SOLR
INTRAVENOUS | Status: AC
  Filled 2013-07-26: qty 50

## 2013-07-26 MED ORDER — PROPOFOL 10 MG/ML IV BOLUS
INTRAVENOUS | Status: DC | PRN
  Administered 2013-07-26: 200 mg via INTRAVENOUS

## 2013-07-26 MED ORDER — OXYCODONE HCL 5 MG PO TABS
5.0000 mg | ORAL_TABLET | Freq: Once | ORAL | Status: AC | PRN
  Administered 2013-07-26: 5 mg via ORAL

## 2013-07-26 MED ORDER — CHLORHEXIDINE GLUCONATE 4 % EX LIQD: 1.0000 "application " | Freq: Once | CUTANEOUS | Status: DC

## 2013-07-26 MED ORDER — MIDAZOLAM HCL 2 MG/2ML IJ SOLN
1.0000 mg | INTRAMUSCULAR | Status: DC | PRN
  Administered 2013-07-26: 2 mg via INTRAVENOUS

## 2013-07-26 MED ORDER — ONDANSETRON HCL 4 MG/2ML IJ SOLN
INTRAMUSCULAR | Status: DC | PRN
  Administered 2013-07-26: 4 mg via INTRAVENOUS

## 2013-07-26 MED ORDER — MIDAZOLAM HCL 5 MG/5ML IJ SOLN
INTRAMUSCULAR | Status: DC | PRN
  Administered 2013-07-26: 2 mg via INTRAVENOUS

## 2013-07-26 MED ORDER — HYDROMORPHONE HCL PF 1 MG/ML IJ SOLN
INTRAMUSCULAR | Status: AC
  Filled 2013-07-26: qty 1

## 2013-07-26 MED ORDER — METHYLENE BLUE 1 % INJ SOLN
INTRAMUSCULAR | Status: AC
  Filled 2013-07-26: qty 10

## 2013-07-26 MED ORDER — TECHNETIUM TC 99M SULFUR COLLOID FILTERED
1.0000 | Freq: Once | INTRAVENOUS | Status: AC | PRN
  Administered 2013-07-26: 1 via INTRADERMAL

## 2013-07-26 MED ORDER — FENTANYL CITRATE 0.05 MG/ML IJ SOLN
INTRAMUSCULAR | Status: AC
  Filled 2013-07-26: qty 2

## 2013-07-26 MED ORDER — LIDOCAINE HCL (CARDIAC) 20 MG/ML IV SOLN
INTRAVENOUS | Status: DC | PRN
  Administered 2013-07-26: 40 mg via INTRAVENOUS

## 2013-07-26 MED ORDER — DEXAMETHASONE SODIUM PHOSPHATE 4 MG/ML IJ SOLN
INTRAMUSCULAR | Status: DC | PRN
  Administered 2013-07-26: 10 mg via INTRAVENOUS

## 2013-07-26 MED ORDER — CEFAZOLIN SODIUM-DEXTROSE 2-3 GM-% IV SOLR
INTRAVENOUS | Status: DC | PRN
  Administered 2013-07-26: 2 g via INTRAVENOUS

## 2013-07-26 MED ORDER — FENTANYL CITRATE 0.05 MG/ML IJ SOLN
INTRAMUSCULAR | Status: AC
  Filled 2013-07-26: qty 6

## 2013-07-26 MED ORDER — HEPARIN SOD (PORK) LOCK FLUSH 100 UNIT/ML IV SOLN
INTRAVENOUS | Status: DC | PRN
  Administered 2013-07-26: 500 [IU] via INTRAVENOUS

## 2013-07-26 MED ORDER — FENTANYL CITRATE 0.05 MG/ML IJ SOLN
50.0000 ug | INTRAMUSCULAR | Status: DC | PRN
  Administered 2013-07-26 (×2): 100 ug via INTRAVENOUS

## 2013-07-26 MED ORDER — EPHEDRINE SULFATE 50 MG/ML IJ SOLN
INTRAMUSCULAR | Status: DC | PRN
  Administered 2013-07-26 (×2): 10 mg via INTRAVENOUS

## 2013-07-26 MED ORDER — KETOROLAC TROMETHAMINE 30 MG/ML IJ SOLN
INTRAMUSCULAR | Status: DC | PRN
  Administered 2013-07-26: 30 mg via INTRAVENOUS

## 2013-07-26 MED ORDER — MIDAZOLAM HCL 2 MG/2ML IJ SOLN
INTRAMUSCULAR | Status: AC
  Filled 2013-07-26: qty 2

## 2013-07-26 MED ORDER — FENTANYL CITRATE 0.05 MG/ML IJ SOLN
INTRAMUSCULAR | Status: DC | PRN
  Administered 2013-07-26 (×4): 25 ug via INTRAVENOUS

## 2013-07-26 MED ORDER — OXYCODONE HCL 5 MG/5ML PO SOLN: 5.0000 mg | Freq: Once | ORAL | Status: AC | PRN

## 2013-07-26 MED ORDER — HYDROCODONE-ACETAMINOPHEN 5-325 MG PO TABS: 1.0000 | ORAL_TABLET | Freq: Four times a day (QID) | ORAL | Status: DC | PRN

## 2013-07-26 MED ORDER — SODIUM CHLORIDE 0.9 % IJ SOLN
INTRAMUSCULAR | Status: AC
  Filled 2013-07-26: qty 10

## 2013-07-26 MED ORDER — LIDOCAINE HCL (PF) 1 % IJ SOLN
INTRAMUSCULAR | Status: AC
  Filled 2013-07-26: qty 30

## 2013-07-26 MED ORDER — HEPARIN (PORCINE) IN NACL 2-0.9 UNIT/ML-% IJ SOLN
INTRAMUSCULAR | Status: AC
  Filled 2013-07-26: qty 500

## 2013-07-26 MED ORDER — HEPARIN SOD (PORK) LOCK FLUSH 100 UNIT/ML IV SOLN
INTRAVENOUS | Status: AC
  Filled 2013-07-26: qty 5

## 2013-07-26 MED ORDER — BUPIVACAINE HCL (PF) 0.25 % IJ SOLN
INTRAMUSCULAR | Status: DC | PRN
  Administered 2013-07-26: 20 mL

## 2013-07-26 MED ORDER — HEPARIN (PORCINE) IN NACL 2-0.9 UNIT/ML-% IJ SOLN
INTRAMUSCULAR | Status: DC | PRN
  Administered 2013-07-26: 500 mL via INTRAVENOUS

## 2013-07-26 MED ORDER — OXYCODONE HCL 5 MG PO TABS
ORAL_TABLET | ORAL | Status: AC
  Filled 2013-07-26: qty 1

## 2013-07-26 MED ORDER — BUPIVACAINE-EPINEPHRINE PF 0.5-1:200000 % IJ SOLN
INTRAMUSCULAR | Status: DC | PRN
  Administered 2013-07-26: 30 mL

## 2013-07-26 MED ORDER — CEFAZOLIN SODIUM-DEXTROSE 2-3 GM-% IV SOLR: 2.0000 g | INTRAVENOUS | Status: DC

## 2013-07-26 MED ORDER — LACTATED RINGERS IV SOLN
INTRAVENOUS | Status: DC
  Administered 2013-07-26 (×3): via INTRAVENOUS

## 2013-07-26 SURGICAL SUPPLY — 78 items
APPLIER CLIP 11 MED OPEN (CLIP)
BAG DECANTER FOR FLEXI CONT (MISCELLANEOUS) ×4 IMPLANT
BANDAGE ELASTIC 6 VELCRO ST LF (GAUZE/BANDAGES/DRESSINGS) IMPLANT
BENZOIN TINCTURE PRP APPL 2/3 (GAUZE/BANDAGES/DRESSINGS) ×4 IMPLANT
BINDER BREAST LRG (GAUZE/BANDAGES/DRESSINGS) IMPLANT
BINDER BREAST MEDIUM (GAUZE/BANDAGES/DRESSINGS) IMPLANT
BINDER BREAST XLRG (GAUZE/BANDAGES/DRESSINGS) IMPLANT
BINDER BREAST XXLRG (GAUZE/BANDAGES/DRESSINGS) IMPLANT
BLADE SURG 10 STRL SS (BLADE) ×4 IMPLANT
BLADE SURG 15 STRL LF DISP TIS (BLADE) ×9 IMPLANT
BLADE SURG 15 STRL SS (BLADE) ×3
CANISTER SUCT 1200ML W/VALVE (MISCELLANEOUS) ×4 IMPLANT
CHLORAPREP W/TINT 26ML (MISCELLANEOUS) ×8 IMPLANT
CLEANER CAUTERY TIP 5X5 PAD (MISCELLANEOUS) ×3 IMPLANT
CLIP APPLIE 11 MED OPEN (CLIP) IMPLANT
CLIP TI WIDE RED SMALL 6 (CLIP) IMPLANT
COVER MAYO STAND STRL (DRAPES) ×4 IMPLANT
COVER PROBE 5X48 (MISCELLANEOUS) ×1
COVER PROBE W GEL 5X96 (DRAPES) ×4 IMPLANT
COVER TABLE BACK 60X90 (DRAPES) ×4 IMPLANT
DECANTER SPIKE VIAL GLASS SM (MISCELLANEOUS) IMPLANT
DERMABOND ADVANCED (GAUZE/BANDAGES/DRESSINGS) ×1
DERMABOND ADVANCED .7 DNX12 (GAUZE/BANDAGES/DRESSINGS) ×3 IMPLANT
DEVICE DUBIN W/COMP PLATE 8390 (MISCELLANEOUS) ×4 IMPLANT
DRAIN CHANNEL 19F RND (DRAIN) IMPLANT
DRAIN HEMOVAC 1/8 X 5 (WOUND CARE) IMPLANT
DRAPE C-ARM 42X72 X-RAY (DRAPES) ×4 IMPLANT
DRAPE LAPAROSCOPIC ABDOMINAL (DRAPES) ×4 IMPLANT
DRAPE LAPAROTOMY T 102X78X121 (DRAPES) ×4 IMPLANT
DRAPE UTILITY XL STRL (DRAPES) ×8 IMPLANT
ELECT COATED BLADE 2.86 ST (ELECTRODE) ×4 IMPLANT
ELECT REM PT RETURN 9FT ADLT (ELECTROSURGICAL) ×4
ELECTRODE REM PT RTRN 9FT ADLT (ELECTROSURGICAL) ×3 IMPLANT
EVACUATOR SILICONE 100CC (DRAIN) IMPLANT
GAUZE SPONGE 4X4 16PLY XRAY LF (GAUZE/BANDAGES/DRESSINGS) IMPLANT
GLOVE SURG SIGNA 7.5 PF LTX (GLOVE) ×12 IMPLANT
GLOVE SURG SS PI 7.0 STRL IVOR (GLOVE) ×8 IMPLANT
GOWN STRL REUS W/ TWL LRG LVL3 (GOWN DISPOSABLE) ×6 IMPLANT
GOWN STRL REUS W/ TWL XL LVL3 (GOWN DISPOSABLE) ×6 IMPLANT
GOWN STRL REUS W/TWL LRG LVL3 (GOWN DISPOSABLE) ×2
GOWN STRL REUS W/TWL XL LVL3 (GOWN DISPOSABLE) ×2
IV CATH AUTO 14GX1.75 SAFE ORG (IV SOLUTION) IMPLANT
IV CATH PLACEMENT UNIT 16 GA (IV SOLUTION) IMPLANT
IV KIT MINILOC 20X1 SAFETY (NEEDLE) IMPLANT
KIT CVR 48X5XPRB PLUP LF (MISCELLANEOUS) ×3 IMPLANT
KIT MARKER MARGIN INK (KITS) ×4 IMPLANT
KIT PORT POWER 8FR ISP CVUE (Catheter) ×4 IMPLANT
NDL SAFETY ECLIPSE 18X1.5 (NEEDLE) ×3 IMPLANT
NEEDLE HYPO 18GX1.5 SHARP (NEEDLE) ×1
NEEDLE HYPO 22GX1.5 SAFETY (NEEDLE) ×4 IMPLANT
NEEDLE HYPO 25X1 1.5 SAFETY (NEEDLE) ×8 IMPLANT
NS IRRIG 1000ML POUR BTL (IV SOLUTION) ×4 IMPLANT
PACK BASIN DAY SURGERY FS (CUSTOM PROCEDURE TRAY) ×8 IMPLANT
PAD ALCOHOL SWAB (MISCELLANEOUS) IMPLANT
PAD CLEANER CAUTERY TIP 5X5 (MISCELLANEOUS) ×1
PENCIL BUTTON HOLSTER BLD 10FT (ELECTRODE) ×8 IMPLANT
PIN SAFETY STERILE (MISCELLANEOUS) IMPLANT
SET SHEATH INTRODUCER 10FR (MISCELLANEOUS) IMPLANT
SHEATH COOK PEEL AWAY SET 9F (SHEATH) IMPLANT
SHEET MEDIUM DRAPE 40X70 STRL (DRAPES) ×4 IMPLANT
SLEEVE SCD COMPRESS KNEE MED (MISCELLANEOUS) ×4 IMPLANT
SPONGE GAUZE 4X4 12PLY (GAUZE/BANDAGES/DRESSINGS) ×4 IMPLANT
SPONGE GAUZE 4X4 12PLY STER LF (GAUZE/BANDAGES/DRESSINGS) ×8 IMPLANT
SPONGE LAP 18X18 X RAY DECT (DISPOSABLE) ×4 IMPLANT
STRIP CLOSURE SKIN 1/4X4 (GAUZE/BANDAGES/DRESSINGS) ×4 IMPLANT
SUT ETHILON 2 0 FS 18 (SUTURE) IMPLANT
SUT ETHILON 3 0 PS 1 (SUTURE) IMPLANT
SUT MON AB 5-0 PS2 18 (SUTURE) ×8 IMPLANT
SUT SILK 3 0 TIES 17X18 (SUTURE)
SUT SILK 3-0 18XBRD TIE BLK (SUTURE) IMPLANT
SUT VIC AB 5-0 PS2 18 (SUTURE) IMPLANT
SUT VICRYL 3-0 CR8 SH (SUTURE) ×12 IMPLANT
SYR 5ML LUER SLIP (SYRINGE) ×4 IMPLANT
SYR CONTROL 10ML LL (SYRINGE) ×8 IMPLANT
TOWEL OR 17X24 6PK STRL BLUE (TOWEL DISPOSABLE) ×8 IMPLANT
TOWEL OR NON WOVEN STRL DISP B (DISPOSABLE) ×4 IMPLANT
TUBE CONNECTING 20X1/4 (TUBING) ×4 IMPLANT
YANKAUER SUCT BULB TIP NO VENT (SUCTIONS) ×4 IMPLANT

## 2013-07-26 NOTE — Transfer of Care (Signed)
Immediate Anesthesia Transfer of Care Note  Patient: Lori Vincent  Procedure(s) Performed: Procedure(s): BREAST LUMPECTOMY WITH NEEDLE LOCALIZATION AND AXILLARY SENTINEL LYMPH NODE BX (Left) MOLE REMOVAL UNDER RIGHT BREAST (Right) INSERTION PORT-A-CATH (Right)  Patient Location: PACU  Anesthesia Type:General and GA combined with regional for post-op pain  Level of Consciousness: awake and alert   Airway & Oxygen Therapy: Patient Spontanous Breathing and Patient connected to face mask oxygen  Post-op Assessment: Report given to PACU RN and Post -op Vital signs reviewed and stable  Post vital signs: Reviewed and stable  Complications: No apparent anesthesia complications

## 2013-07-26 NOTE — Anesthesia Preprocedure Evaluation (Addendum)
Anesthesia Evaluation  Patient identified by MRN, date of birth, ID band Patient awake    Reviewed: Allergy & Precautions, H&P , NPO status , Patient's Chart, lab work & pertinent test results  Airway Mallampati: I TM Distance: >3 FB Neck ROM: Full    Dental no notable dental hx. (+) Teeth Intact, Dental Advisory Given   Pulmonary neg pulmonary ROS,  breath sounds clear to auscultation  Pulmonary exam normal       Cardiovascular negative cardio ROS  Rhythm:Regular Rate:Normal     Neuro/Psych Depression negative neurological ROS     GI/Hepatic negative GI ROS, Neg liver ROS,   Endo/Other  negative endocrine ROSHypothyroidism   Renal/GU negative Renal ROS  negative genitourinary   Musculoskeletal   Abdominal   Peds  Hematology negative hematology ROS (+)   Anesthesia Other Findings   Reproductive/Obstetrics negative OB ROS                          Anesthesia Physical Anesthesia Plan  ASA: II  Anesthesia Plan: General and Regional   Post-op Pain Management:    Induction: Intravenous  Airway Management Planned: LMA  Additional Equipment:   Intra-op Plan:   Post-operative Plan: Extubation in OR  Informed Consent: I have reviewed the patients History and Physical, chart, labs and discussed the procedure including the risks, benefits and alternatives for the proposed anesthesia with the patient or authorized representative who has indicated his/her understanding and acceptance.   Dental advisory given  Plan Discussed with: CRNA  Anesthesia Plan Comments:         Anesthesia Quick Evaluation

## 2013-07-26 NOTE — Op Note (Addendum)
07/26/2013  2:22 PM  PATIENT:  Lori Vincent DOB: [birth year] MRN: [id number]  PREOP DIAGNOSIS:  LEFT BREAST CANCER   POSTOP DIAGNOSIS:   Left breast cancer, 6 o'clock position (T2, N0), anticipate chemotherapy  PROCEDURE:   Procedure(s):   Left BREAST LUMPECTOMY WITH NEEDLE LOCALIZATION (x2) AND Left AXILLARY SENTINEL LYMPH NODE BX, 3 MOLEs (papillomas) REMOVAL UNDER RIGHT BREAST, INSERTION PORT-A-CATH using fluroscopy  SURGEON:   [name], M.D.  ANESTHESIA:   general  Anesthesiologist: Arabella Merles, MD CRNA: April W Carter, CRNA; Toula Moos, CRNA  General  Dr Williemae Area did a left pectoral block at the beginning of the case.  EBL:  minimal  ml  DRAINS: none   LOCAL MEDICATIONS USED:   20 cc 1/4% marcaine (Dr. Ola Spurr used about 20 cc in the pectoral block  SPECIMEN:   Left breast lumpectomy, left axillary sentinel lymph node biopsy, moles under right breast x 3  COUNTS CORRECT:  YES  INDICATIONS FOR PROCEDURE:  Lori Vincent is a 58 y.o. (DOB: 1955/08/26) white  female whose primary care physician is CURTIS,CAROL G, NP and comes for left breast lumpectomy and left axillary sentinel lymph node biopsy.   Options for breast cancer treatment were discussed with the patient. She elected to proceed with lumpectomy and axillary sentinel lymph node.     The indications and potential complications of surgery were explained to the patient. Potential complications include, but are not limited to, bleeding, infection, the need for further surgery, and nerve injury.     The patient had 2 needle loc wires placed at the 4 o'clock position - aiming towards the 6 o'clock area of the left breast at [facility] by Dr. Bedelia Person.  In the holding area, her left areola was injected with 1 millicurie of Technitium Sulfur Colloid.  OPERATIVE NOTE:   The patient was taken to room # 5 at [facility] where she underwent a general anesthesia  supervised by Anesthesiologist:  Arabella Merles, MD CRNA: April W Carter, CRNA; Toula Moos, CRNA. Her left breast and axilla were prepped with  ChloraPrep and sterilely draped.    A time-out and the surgical check list was reviewed.    Because of good localization with the Technetium sulfa colloid, I did not inject methylene blue.   I started with the left axillary sentinel lymph node biopsy.  I made an incision in the left axilla.  I found a hot area at the junction of the breast and the pectoralis major muscle. I cut down and  identified a hot node that had counts of 1600 and the background has 30 counts.  I checked her internal mammary nodes and supraclavicular nodes with the neoprobe and found no other hot area. The axillary node was then sent to pathology.    I turned attention to the cancer which was about at the 6 o'clock position of the left breast. The 2 needle loc wires were exiting the breast at the 4 o'clock position.   I cut down around the wire and tried to take an ellipse of breast tissue around the tumor by at least 1 cm.   I excised this block of breast tissue approximately 6 cm by 7 cm  in diameter. I did take the dissection down to the pectoralis major. I painted the lumpectomy specimen with the 6 color paint kit and did a specimen mammogram which confirmed the mass, clip and the wire were all in the right position. The  specimen was sent for gross examination and Dr. Donato Heinz said that I had 1 cm fatty tissue on the cranial margin.    I then irrigated the wound with saline. I infiltrated approximately 30 mL of 1% local between the  Incisions.  I placed 6 clips to mark biopsy cavity, at 12, 3, 6, and 9 o'clock. Two clips were placed on the pectoralis major.   I then closed all the wounds in layers using 3-0 Vicryl sutures for the deep layer. At the skin, I closed the incisions with a 5-0 Monocryl suture. The incisions were then painted with Dermabond.  She had gauze place over the wounds and placed in a breast  binder.   The patient was re-prepped and draped for the power port insertion.  Another time out was held and the surgery checklist reviewed.   She had three 3-4 mm papillomas under her right breast on the right chest wall.  These were removed with scissors and sent to pathology.     The patient was placed in Trendelenburg position.  The right subclavian vein was accessed with a 16 gauge needle and a guide wire threaded through the needle into the vein.  The position of the wire was checked with fluoroscopy.   I then developed a pocket in the upper inner aspect of the right chest for the port reservoir.  I used the [employer] for venous access.  The reservoir was sewn in place with a 3-0 Vicryl suture.  The reservoir had been flushed with dilute (10 units/cc) heparin.   I then passed the silastic tubing from the reservoir incision to the subclavian stick site and used the 8 French introducer to pass it into the vein.  The tip of the silastic catheter was position at the junction of the SVC and the right atrium under fluoroscopy.  The silastic catheter was then attached to the port with the bayonet device.     The entire port and tubing were checked with fluoroscopy and then the port was flushed with concentrated heparin (100 units/cc).   The wounds were then closed with 3-0 vicryl subcutaneous sutures and the skin closed with a 5-0 Vicryl suture.  The skin was painted with tincture of benzoin and steri-stripped.   The patient was transferred to the recovery room in good condition.  The sponge and needle count were correct at the end of the case.  A CXR is ordered for port placement and pending at the time of this note.  [name], MD, [facility] Pager: [phone] Office phone:  [phone]

## 2013-07-26 NOTE — Anesthesia Procedure Notes (Addendum)
Anesthesia Regional Block:  Pectoralis block  Pre-Anesthetic Checklist: ,, timeout performed, Correct Patient, Correct Site, Correct Laterality, Correct Procedure, Correct Position, site marked, Risks and benefits discussed, pre-op evaluation, post-op pain management  Laterality: Left  Prep: Maximum Sterile Barrier Precautions used and chloraprep       Needles:  Injection technique: Single-shot  Needle Type: Echogenic Stimulator Needle     Needle Length: 9cm 9 cm Needle Gauge: 21 and 21 G    Additional Needles:  Procedures: ultrasound guided (picture in chart) Pectoralis block Narrative:  Start time: 07/26/2013 11:15 AM End time: 07/26/2013 11:25 AM Injection made incrementally with aspirations every 5 mL. Anesthesiologist: Fitzgerald,MD  Additional Notes: 2% Lidocaine skin wheel.   Procedure Name: LMA Insertion Date/Time: 07/26/2013 11:57 AM Performed by: Toula Moos Pre-anesthesia Checklist: Patient identified, Emergency Drugs available, Suction available, Patient being monitored and Timeout performed Patient Re-evaluated:Patient Re-evaluated prior to inductionOxygen Delivery Method: Circle System Utilized Preoxygenation: Pre-oxygenation with 100% oxygen Intubation Type: IV induction Ventilation: Mask ventilation without difficulty LMA: LMA inserted LMA Size: 3.0 Number of attempts: 1 Airway Equipment and Method: bite block Placement Confirmation: positive ETCO2 and breath sounds checked- equal and bilateral Tube secured with: Tape Dental Injury: Teeth and Oropharynx as per pre-operative assessment

## 2013-07-26 NOTE — Progress Notes (Signed)
Assisted Dr. Fitzgerald with left, ultrasound guided, pectoralis block. Side rails up, monitors on throughout procedure. See vital signs in flow sheet. Tolerated Procedure well. 

## 2013-07-26 NOTE — H&P (Signed)
Re: Lori Vincent  DOB: Dec 04, 1955  MRN: [id number]   ASSESSMENT AND PLAN:  1. Left breast cancer, 6 o'clock (T2, N0)   Invasive ductal carcinoma on biopsy, ER - 97%, PR - 75%, Ki67 - 44%, Her2Neu - Positive  MRI - 06/08/2013 - 2.9 x 2.1 x1.9 cm   I discussed the options for breast cancer treatment with the patient. I discussed a multidisciplinary approach to the treatment of breast cancer, which includes medical oncology and radiation oncology. I discussed the surgical options of lumpectomy vs. mastectomy. If mastectomy, there is the possibility of reconstruction. I discussed the options of lymph node biopsy. The treatment plan depends on the pathologic staging of the tumor and the patient's personal wishes.  The risks of surgery include, but are not limited to, bleeding, infection, the need for further surgery, and nerve injury.   The patient has been given literature on the treatment of breast cancer.   Since she is Her2Neu positive - Seen by Dr. Glendora Vincent. Murray  There are a lot of overlying issues/questions since her husband died of metastatic sarcoma.    Plan: 1) Medical oncology consult to confirm planned chemotherapy, 2) Lumpectomy (for wire loc-requested Dr. Joneen Vincent if possible), left axillary sentinel lymph node biopsy, power port placement, 3) Chemotherapy (this could be done as neoadjuvant if everyone thought that is in her best interest), 4) Radiation therapy.  Lori Vincent is going to [place] with a friend on 2/11 - so we have moved her surgery to 07/26/2013. Discussed with patient. DN 07/01/2013]   Lori Vincent, with patient today.  1A. Probable chemotherapy - I discussed power port placement at the same time as the lumpectomy.  The risks include bleeding, infection, pneumothorax, and thrombosis.  2. On thyroid replacement  3. History of depression  4.  she has some moles below her right breast that she wants removed  3 skin papillomas below right breast  Chief Complaint   Patient  presents with   .  New Evaluation     left br ca    REFERRING PHYSICIAN: [name], NP   HISTORY OF PRESENT ILLNESS:  Lori Vincent is a 58 y.o. (DOB: 01/09/1956) white female whose primary care physician is Lori G, NP and comes to me today for left breast cancer.  She comes by herself.  [name] on 04/26/2013. She also had an [place] on 04/26/2013. The report showed "two adjacent suspicious masses in the 6 o'clock position of the left breast." For financial reasons, the patient waited until 06/04/2013 to have a biopsy of her left breast. The path report of the left breast biopsy (Accession: [id number]) - 06/03/2013 - Invasive ductal carcinoma with DCIS.  She is not on hormone. Her last period was about 1 year ago. She has no family history of breast cancer. She could not feel this mass, even after it was found on mammogram/US.   MRI - 06/08/2013 - shows Irregular enhancing mass within the left breast compatible with recently biopsy proven invasive ductal carcinoma. Extending along the posterior medial margin of this mass are 2 adjacent irregular masses when measured in total extending 2.9 cm. As these masses are immediately adjacent to each other, they likely can be included in single surgical specimen.  No past medical history on file.   Past Surgical History   Procedure  Laterality  Date   .  Tonsillectomy   1972   .  Cesarean section   04/1999  Current Outpatient Prescriptions   Medication  Sig  Dispense  Refill   .  citalopram (CELEXA) 10 MG tablet      .  etodolac (LODINE) 400 MG tablet  Take 400 mg by mouth 2 (two) times daily.     Marland Kitchen  SYNTHROID 125 MCG tablet       No current facility-administered medications for this visit.    Allergies   Allergen  Reactions   .  Aloe     REVIEW OF SYSTEMS:  Skin: No history of rash. No history of abnormal moles.  Infection: No history of hepatitis or HIV. No history of MRSA.  Neurologic: No history of  stroke. No history of seizure. No history of headaches.  Cardiac: No history of hypertension. No history of heart disease. No history of prior cardiac catheterization. No history of seeing a cardiologist.  Pulmonary: Does not smoke cigarettes. No asthma or bronchitis. No OSA/CPAP.  Endocrine: No diabetes. Hypothyroid - has seen Dr. Kristeen Vincent recently to adjust her thyroid doses.  Gastrointestinal: No history of stomach disease. No history of liver disease. No history of gall bladder disease. No history of pancreas disease. No history of colon disease.  Urologic: No history of kidney stones. No history of bladder infections.  Musculoskeletal: No history of joint or back disease.  Hematologic: No bleeding disorder. No history of anemia. Not anticoagulated.  Psycho-social: The patient is oriented. The patient has no obvious psychologic or social impairment to understanding our conversation and plan.   SOCIAL and FAMILY HISTORY:  Husband died Aug 17, 2009 secondary to leg sarcoma. He was taken care over at [facility].  She has one daughter age 73. Has two step children at college.  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   PHYSICAL EXAM:  BP 171/82  Pulse 102  Temp(Src) 97.9 F (36.6 C) (Oral)  Resp 18  Ht '5\' 8"'  (1.727 m)  Wt 220 lb (99.791 kg)  BMI 33.46 kg/m2  SpO2 98%  General: WN WF who is alert and generally healthy appearing.  HEENT: Normal. Pupils equal.  Neck: Supple. No mass. No thyroid mass.  Lymph Nodes: No supraclavicular, cervical, or axillary adenopathy.  Breasts: Right - no mass  Left - bruise at 6 o'clock. No mass.  Lungs: Clear to auscultation and symmetric breath sounds.  Heart: RRR. No murmur or rub.  Abdomen: Soft. No mass. No tenderness. No hernia. Normal bowel sounds. No abdominal scars.  Rectal: Not done.  Extremities: Good strength and ROM in upper and lower extremities.  Neurologic: Grossly intact to motor and sensory function.  Psychiatric: Has normal  mood and affect. Behavior is normal.   DATA REVIEWED:  Notes in Epic.  Lori Overall, MD, [facility], [address] [place], [place]  Phone: [phone] FAX: [phone]

## 2013-07-26 NOTE — Anesthesia Postprocedure Evaluation (Signed)
  Anesthesia Post-op Note  Patient: Lori Vincent  Procedure(s) Performed: Procedure(s): BREAST LUMPECTOMY WITH NEEDLE LOCALIZATION AND AXILLARY SENTINEL LYMPH NODE BX (Left) MOLE REMOVAL UNDER RIGHT BREAST (Right) INSERTION PORT-A-CATH (Right)  Patient Location: PACU  Anesthesia Type:General and PECS block  Level of Consciousness: awake and alert   Airway and Oxygen Therapy: Patient Spontanous Breathing  Post-op Pain: mild   Post-op Assessment: Post-op Vital signs reviewed, Patient's Cardiovascular Status Stable and Respiratory Function Stable  Post-op Vital Signs: Reviewed  Filed Vitals:   07/26/13 1500  BP: 134/67  Pulse: 84  Temp:   Resp: 16    Complications: No apparent anesthesia complications

## 2013-07-26 NOTE — Discharge Instructions (Signed)
[facility] - DISCHARGE INSTRUCTIONS TO PATIENT  Activity:  Driving - May drive in one or two days, depending on how you are doing.   Lifting - No lifting greater than 15 pounds for 1 week, then no limit  Wound Care:   Leave dressings for 2 days, then may remove and shower.  Diet:  As tolerated  Follow up appointment:  Call Dr. Pollie Friar office [facility]) at [phone] for an appointment in 2 to 3 weeks.  Medications and dosages:  Resume your home medications.  You have a prescription for:  Vicodin.  Call Dr. Lucia Gaskins or his office  [phone]) if you have:  Temperature greater than 100.4,  Persistent nausea and vomiting,  Severe uncontrolled pain,  Redness, tenderness, or signs of infection (pain, swelling, redness, odor or green/yellow discharge around the site),  Difficulty breathing, headache or visual disturbances,  Any other questions or concerns you may have after discharge.  In an emergency, call 911 or go to an Emergency Department at a nearby hospital.   Post Anesthesia Home Care Instructions  Activity: Get plenty of rest for the remainder of the day. A responsible adult should stay with you for 24 hours following the procedure.  For the next 24 hours, DO NOT: -Drive a car -Paediatric nurse -Drink alcoholic beverages -Take any medication unless instructed by your physician -Make any legal decisions or sign important papers.  Meals: Start with liquid foods such as gelatin or soup. Progress to regular foods as tolerated. Avoid greasy, spicy, heavy foods. If nausea and/or vomiting occur, drink only clear liquids until the nausea and/or vomiting subsides. Call your physician if vomiting continues.  Special Instructions/Symptoms: Your throat may feel dry or sore from the anesthesia or the breathing tube placed in your throat during surgery. If this causes discomfort, gargle with warm salt water. The discomfort should disappear within 24  hours.

## 2013-07-28 NOTE — Addendum Note (Signed)
Addendum created 07/28/13 0802 by Tawni Millers, CRNA   Modules edited: Charges VN

## 2013-07-29 ENCOUNTER — Encounter (HOSPITAL_BASED_OUTPATIENT_CLINIC_OR_DEPARTMENT_OTHER): Payer: Self-pay | Admitting: Surgery

## 2013-07-30 ENCOUNTER — Telehealth: Payer: Self-pay | Admitting: *Deleted

## 2013-07-30 ENCOUNTER — Telehealth: Payer: Self-pay | Admitting: Oncology

## 2013-07-30 ENCOUNTER — Ambulatory Visit (HOSPITAL_BASED_OUTPATIENT_CLINIC_OR_DEPARTMENT_OTHER): Payer: BC Managed Care – PPO | Admitting: Oncology

## 2013-07-30 ENCOUNTER — Telehealth (INDEPENDENT_AMBULATORY_CARE_PROVIDER_SITE_OTHER): Payer: Self-pay | Admitting: General Surgery

## 2013-07-30 VITALS — BP 166/96 | HR 75 | Temp 97.9°F | Resp 18 | Ht 68.0 in | Wt 222.8 lb

## 2013-07-30 DIAGNOSIS — Z170 Estrogen receptor positive status [ER+]: Secondary | ICD-10-CM

## 2013-07-30 DIAGNOSIS — C50919 Malignant neoplasm of unspecified site of unspecified female breast: Secondary | ICD-10-CM

## 2013-07-30 NOTE — Telephone Encounter (Signed)
Pt called to ask about return to gym and pain control.  Recommended she wait a minimum of 2 full weeks from surgery to return to the gym, regardless of her activity there.  Then she should cautiously approach her work out and back off if she encounters pain at the site.  She understands this.  Regarding her pain, she is still taking 1-2 Norco for lumpectomy 4 days ago.  Advised her to augment her narcotics with ibuprofen 600 mg Q6H or 800 mg Q8H, saving the narcotic for HS.  She will try this to stretch out the use of the narcotics.

## 2013-07-30 NOTE — Telephone Encounter (Signed)
, °

## 2013-07-30 NOTE — Telephone Encounter (Signed)
I have adjusted appts 

## 2013-07-30 NOTE — Telephone Encounter (Signed)
Per staff message and POF I have scheduled appts.  JMW  

## 2013-07-30 NOTE — Progress Notes (Signed)
Lori Vincent  [phone] Fax:(336) [phone]     ID: Lori Vincent OB: 1956-01-29  MR#: [id number]  QQP#:[id number]  PCP: Lori Fusi, NP GYN:  Lori Vincent SU: Lori Vincent OTHER MD: Lori Vincent  CHIEF COMPLAINT: "I have my final surgery"  HISTORY OF PRESENT ILLNESS: Lori Vincent [facility] showing a possible mass in the left breast. Diagnostic left mammography and ultrasonography at the breast center 04/26/2013 confirmed an oval mass with microcalcifications in the 6:00 position of the left breast. There was an adjacent partially obscured 1.2 cm mass. On physical exam there was no palpable mass in the left breast. By ultrasonography, there was an oval mass at the 6:00 position measuring 1.4 cm, with a second mass measuring 1.1 cm. There were no abnormal lymph nodes noted in the left axilla.  Biopsy of the 6:00 mass in the left breast January 2015 showed ([id number]) and invasive ductal carcinoma, grade 3, estrogen receptor 97% positive, progesterone receptor 75% positive, with strong staining intensity, and MIB-144%. HER-2 was amplified, with the signals ratio 4.18 and in number per cell of 9.40 .  On 06/08/2013 the patient underwent bilateral breast MRI which showed a dumbbell-shaped area in the central left breast consisting of a 2 cm mass and 2 extensions or an adjacent masses, measuring a total of 2.9 cm. There were no abnormal appearing lymph nodes for any other areas of concern.  The patient's subsequent history is as detailed below.   INTERVAL HISTORY: Lori Vincent returns today for followup of her breast cancer. Since her last visit here she underwent left lumpectomy and sentinel lymph node sampling (07/26/2013, [id number]) showing a 3 cm invasive ductal carcinoma, grade 3, with both sentinel lymph nodes negative. Margins were ample. The prognostic panel which was triple positive was not repeated. She  is here today to discuss adjuvant treatment.  REVIEW OF SYSTEMS: She did well with the surgery, without unusual pain, bleeding, fever, or other acute complications. She does have some soreness at the port site more than in the breast itself. She is concerned because the left nipple is now inverted and" it looks terrible". She is interested in considering plastic surgery for this. Otherwise a detailed review of systems today is noncontributory  PAST MEDICAL HISTORY: Past Medical History  Diagnosis Date  . Breast cancer 06/03/13    left, 6:00 o'clock  . Thyroid disease     hypothyroidism  . Hypothyroidism   . Depression     PAST SURGICAL HISTORY: Past Surgical History  Procedure Laterality Date  . Tonsillectomy  1972  . Cesarean section  04/1999  . Breast biopsy  1/14    lt  . Dilation and curettage of uterus    . Colonoscopy  2013  . Breast lumpectomy with needle localization and axillary sentinel lymph node bx Left 07/26/2013    Procedure: BREAST LUMPECTOMY WITH NEEDLE LOCALIZATION AND AXILLARY SENTINEL LYMPH NODE BX;  Surgeon: Lori Medal, MD;  Location: [place];  Service: General;  Laterality: Left;  Marland Kitchen Mole removal Right 07/26/2013    Procedure: MOLE REMOVAL UNDER RIGHT BREAST;  Surgeon: Lori Medal, MD;  Location: [place];  Service: General;  Laterality: Right;  . Portacath placement Right 07/26/2013    Procedure: INSERTION PORT-A-CATH;  Surgeon: Lori Medal, MD;  Location: [place];  Service: General;  Laterality: Right;    FAMILY HISTORY Family History  Problem Relation Age  of Onset  . Stroke Mother   . Diabetes Father   . Cancer Maternal Grandmother 31    colon   the patient's father died at the age of 47 in a car accident. The patient's mother died at the age of 72 from a stroke. The patient has one adopted brother. She has one biological sister. The patient's mother's mother was diagnosed with colon cancer at the  age of 29. There is no history of breast oropharynx cancer in the family to the patient's knowledge.  GYNECOLOGIC HISTORY:  Menarche age 59. First live birth age 54. The patient is GX P1. She took birth control pills for approximately 12 years remotely. She had a period in February of 2014 and then in December of 2014. She had been started on hormone replacement in December. That has since been discontinued.  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 Her husband died at [place] 3 years ago with soft tissue sarcoma. The patient's significant other also works as a Geophysicist/field seismologist. At home it is just the patient and her daughter 45, 65 years old. The patient has 2 stepchildren from her earlier marriage. 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  ADVANCED DIRECTIVES: Not in place   HEALTH MAINTENANCE: History  Substance Use Topics  . Smoking status: Never Smoker   . Smokeless tobacco: Never Used  . Alcohol Use: Yes     Comment: 5-7 glasses per week of red wine     Colonoscopy: July 2014  PAP: November 2014  Bone density: November 2014  Lipid panel:  Allergies  Allergen Reactions  . No Known Drug Allergy     Current Outpatient Prescriptions  Medication Sig Dispense Refill  . citalopram (CELEXA) 10 MG tablet Take 10 mg by mouth daily.       Marland Kitchen etodolac (LODINE) 400 MG tablet Take 400 mg by mouth 2 (two) times daily.      Marland Kitchen HYDROcodone-acetaminophen (NORCO/VICODIN) 5-325 MG per tablet Take 1-2 tablets by mouth every 6 (six) hours as needed.  30 tablet  0  . levothyroxine (SYNTHROID, LEVOTHROID) 112 MCG tablet Take 112 mcg by mouth daily before breakfast. Take sat and sunday      . Multiple Vitamins-Minerals (CENTRUM SILVER PO) Take 1 tablet by mouth daily.      Marland Kitchen SYNTHROID 125 MCG tablet Take 125 mcg by mouth every morning. Monday-Friday-5 days a week       No current  facility-administered medications for this visit.    OBJECTIVE: Middle-aged white woman who appears younger than stated age 28 Vitals:   07/30/13 1016  BP: 166/96  Pulse: 75  Temp:   Resp:      Body mass index is 33.88 kg/(m^2).    ECOG FS:1 - Symptomatic but completely ambulatory  Sclerae unicteric, pupils equal and reactive Oropharynx clear and moist No cervical or supraclavicular adenopathy Lungs no rales or rhonchi Heart regular rate and rhythm Abd soft, nontender, positive bowel sounds MSK no focal spinal tenderness, no upper extremity lymphedema Neuro: nonfocal, well oriented, appropriate affect Breasts: The right breast is unremarkable. The left breast is status post recent biopsy. The incisions are healing nicely, without evidence of dehiscence, inflammation, or erythema. The left nipple is now inverted. The left axilla is benign   LAB RESULTS:  CMP  No results found for this basename: na,  k,  cl,  co2,  glucose,  bun,  creatinine,  calcium,  prot,  albumin,  ast,  alt,  alkphos,  bilitot,  gfrnonaa,  gfraa    I No results found for this basename: SPEP,  UPEP,   kappa and lambda light chains    No results found for this basename: WBC,  NEUTROABS,  HGB,  HCT,  MCV,  PLT      Chemistry   No results found for this basename: NA,  K,  CL,  CO2,  BUN,  CREATININE,  GLU   No results found for this basename: CALCIUM,  ALKPHOS,  AST,  ALT,  BILITOT       No results found for this basename: LABCA2    No components found with this basename: [id number]    No results found for this basename: INR,  in the last 168 hours  Urinalysis No results found for this basename: colorurine,  appearanceur,  labspec,  phurine,  glucoseu,  hgbur,  bilirubinur,  ketonesur,  proteinur,  urobilinogen,  nitrite,  leukocytesur    STUDIES: Nm Sentinel Node Inj-no Rpt (breast)  07/26/2013   CLINICAL DATA: left breast cancer   Sulfur colloid was injected intradermally by the nuclear  medicine  technologist for breast cancer sentinel node localization.    Dg Chest Port 1 View  07/26/2013   CLINICAL DATA:  Port-A-Cath placement.  EXAM: PORTABLE CHEST - 1 VIEW  COMPARISON:  None.  FINDINGS: Right subclavian Port-A-Cath is present. Catheter tip in the lower SVC region. Evidence for subcutaneous gas along the left hemithorax. There is no evidence for a pneumothorax. Low lung volumes. Prominent lung markings are probably related to the low lung volumes rather than edema. Heart size is within normal limits.  IMPRESSION: Port-A-Cath tip in the lower SVC.  No evidence for a pneumothorax.  Prominent lung markings probably related to technique and low lung volumes. Mild edema cannot be excluded.   Electronically Signed   By: Markus Daft M.D.   On: 07/26/2013 15:36   Dg Fluoro Guide Cv Line-no Report  07/26/2013   CLINICAL DATA: PORT PLACEMENT   FLOURO GUIDE CV LINE  Fluoroscopy was utilized by the requesting physician.  No radiographic  interpretation.    [place] Lt Plc Breast Loc Dev   1st Lesion  Inc US Guide  07/26/2013   CLINICAL DATA:  Left breast invasive ductal carcinoma and ductal carcinoma in situ.  EXAM: ULTRASOUND NEEDLE LOCALIZATION OF THE LEFT BREAST  COMPARISON:  Previous exams.  FINDINGS: Patient presents for needle localization prior to lumpectomy. I met with the patient and we discussed the procedure of needle localization including benefits and alternatives. We discussed the high likelihood of a successful procedure. We discussed the risks of the procedure, including infection, bleeding, tissue injury, and further surgery. Informed, written consent was given.  The usual time-out protocol was performed immediately prior to the procedure.  Using ultrasound guidance, sterile technique, 2% lidocaine, and two 7 cm Inrad Ultrawire needles, the biopsied mass and adjacent satellite nodule werelocalized using a lateromedial approach. Films were labeled and sent with the patient to surgery. She  tolerated the procedure well.  Specimen radiograph is performed at [facility] and confirms the clip, and 2 wires to be present in the tissue sample. The specimen is marked for pathology.  IMPRESSION: Needle localization left breast.  No apparent complications.   Electronically Signed   By: Ulyess Blossom M.D.   On: 07/26/2013 13:03   ASSESSMENT: 58 y.o. [place] woman, status  post left breast biopsy 06/03/2013 for a clinical T2 N0, stage IIA invasive ductal carcinoma, grade 3, estrogen and progesterone receptor positive, with an MIB-1 of 44%, and HER-2 amplified.  (1) status post left lumpectomy and sentinel lymph node sampling 07-2013 for a pT2 pN0, stage IIA invasive ductal carcinoma, grade 3, with negative margins.  (2) to start carboplatin (AUC 5), docetaxel, trastuzumab and pertuzumab 09/02/2013, to be repeated every 21 days x6 with Neulasta support on day 2  (3) trastuzumab and pertuzumab to continue to total one year  (4) adjuvant radiation to follow chemotherapy  (5) adjuvant anti-estrogens to follow radiation   PLAN: Jordayn will not qualify for the Kaitlin  Trial. Today we spent approximately 1 hour discussing first of all her pathology, and she understands it is very favorable better lymph nodes are negative. The adjuvant! Program would quote her a risk of relapse of 44%, which could decrease by 29% with optimal antiestrogen therapy and chemotherapy. That would bring it down to 15%. However anti-HER-2 treatment should cut that down by more than half, so her final risk of recurrence will be in the 7% range. Her risk of mortality will be in the 4% range.  Since she does not qualify for this study and she wants to continue to work through chemotherapy, it would be better to treat her with the carboplatin, docetaxel, trastuzumab/pertuzumab protocol and the plan would be for 6 cycles given every 3 weeks with Neulasta on day 2. Her daughter has a graduation the week of  June 5, so we are going to start her treatments on April 9, which will best fit that planned visit.  Today we discussed side effects, toxicities and complications of chemotherapy including concerns regarding the immune system, weakening of the heart muscle, and peripheral neuropathy. She will have an echocardiogram before we start. I have also set her up for "chemotherapy school". She will meet with my 14 assistant 08/25/2013 to receive her prescriptions and instructions regarding supportive meds and to schedule her followup appointments.  Araina will be interested in nipple revision on the left I am referring her to Dr. Harlow Mares to start that discussion.  Lyzette has a good understanding of the Vincent plan. She agrees with it. She knows the goal of treatment is cure. She will call with any problems that may develop before the next visit here.  Chauncey Cruel, MD   07/30/2013 10:31 AM

## 2013-08-02 ENCOUNTER — Other Ambulatory Visit (INDEPENDENT_AMBULATORY_CARE_PROVIDER_SITE_OTHER): Payer: Self-pay

## 2013-08-02 ENCOUNTER — Other Ambulatory Visit (INDEPENDENT_AMBULATORY_CARE_PROVIDER_SITE_OTHER): Payer: Self-pay | Admitting: Surgery

## 2013-08-02 ENCOUNTER — Other Ambulatory Visit: Payer: Self-pay | Admitting: Oncology

## 2013-08-02 ENCOUNTER — Telehealth: Payer: Self-pay | Admitting: *Deleted

## 2013-08-02 ENCOUNTER — Encounter: Payer: Self-pay | Admitting: Oncology

## 2013-08-02 ENCOUNTER — Telehealth (INDEPENDENT_AMBULATORY_CARE_PROVIDER_SITE_OTHER): Payer: Self-pay

## 2013-08-02 DIAGNOSIS — C50919 Malignant neoplasm of unspecified site of unspecified female breast: Secondary | ICD-10-CM

## 2013-08-02 MED ORDER — HYDROCODONE-ACETAMINOPHEN 5-325 MG PO TABS: 1.0000 | ORAL_TABLET | Freq: Four times a day (QID) | ORAL | Status: DC | PRN

## 2013-08-02 NOTE — Telephone Encounter (Signed)
Per staff message I have adjusted 4/9 appt

## 2013-08-02 NOTE — Telephone Encounter (Signed)
Norco 5-325mg  1-2 po Q6hrs prn Pain # 30 reordered per DR. Newman/DR Dalbert Batman sign RX Patient will P/U 08-03-13 at front desk

## 2013-08-03 ENCOUNTER — Telehealth: Payer: Self-pay | Admitting: Oncology

## 2013-08-03 NOTE — Telephone Encounter (Signed)
, °

## 2013-08-11 ENCOUNTER — Encounter (HOSPITAL_COMMUNITY): Payer: Self-pay

## 2013-08-11 ENCOUNTER — Ambulatory Visit (HOSPITAL_COMMUNITY)
Admission: RE | Admit: 2013-08-11 | Discharge: 2013-08-11 | Disposition: A | Discharge status: Home / Self Care | Payer: BC Managed Care – PPO | Source: Ambulatory Visit | Attending: Oncology | Admitting: Oncology

## 2013-08-11 DIAGNOSIS — C50919 Malignant neoplasm of unspecified site of unspecified female breast: Secondary | ICD-10-CM | POA: Insufficient documentation

## 2013-08-11 DIAGNOSIS — Z8249 Family history of ischemic heart disease and other diseases of the circulatory system: Secondary | ICD-10-CM | POA: Insufficient documentation

## 2013-08-11 DIAGNOSIS — Z9010 Acquired absence of unspecified breast and nipple: Secondary | ICD-10-CM | POA: Insufficient documentation

## 2013-08-11 MED ORDER — SODIUM CHLORIDE 0.9 % IV SOLN
INTRAVENOUS | Status: DC
  Administered 2013-08-11: 250 mL via INTRAVENOUS
  Filled 2013-08-11: qty 1000

## 2013-08-11 MED ORDER — PERFLUTREN LIPID MICROSPHERE
1.0000 mL | INTRAVENOUS | Status: AC | PRN
  Administered 2013-08-11: 2 mL via INTRAVENOUS
  Filled 2013-08-11: qty 10

## 2013-08-11 NOTE — Progress Notes (Signed)
  Echocardiogram 2D Echocardiogram has been performed.  Carla Rashad 08/11/2013, 1:04 PM

## 2013-08-13 ENCOUNTER — Encounter (INDEPENDENT_AMBULATORY_CARE_PROVIDER_SITE_OTHER): Payer: Self-pay | Admitting: Surgery

## 2013-08-13 ENCOUNTER — Encounter (INDEPENDENT_AMBULATORY_CARE_PROVIDER_SITE_OTHER): Payer: BC Managed Care – PPO | Admitting: Surgery

## 2013-08-18 ENCOUNTER — Encounter (INDEPENDENT_AMBULATORY_CARE_PROVIDER_SITE_OTHER): Payer: Self-pay | Admitting: Surgery

## 2013-08-18 ENCOUNTER — Telehealth: Payer: Self-pay | Admitting: Physician Assistant

## 2013-08-18 ENCOUNTER — Ambulatory Visit (INDEPENDENT_AMBULATORY_CARE_PROVIDER_SITE_OTHER): Payer: BC Managed Care – PPO | Admitting: Surgery

## 2013-08-18 VITALS — BP 134/84 | HR 72 | Temp 98.6°F | Resp 18 | Ht 68.0 in | Wt 218.2 lb

## 2013-08-18 DIAGNOSIS — C50512 Malignant neoplasm of lower-outer quadrant of left female breast: Secondary | ICD-10-CM

## 2013-08-18 DIAGNOSIS — C50519 Malignant neoplasm of lower-outer quadrant of unspecified female breast: Secondary | ICD-10-CM

## 2013-08-18 NOTE — Progress Notes (Signed)
Re:   Lori Vincent DOB:   Jun 04, 1955 MRN:   [id number]  ASSESSMENT AND PLAN: 1.  Left breast cancer, 6 o'clock (T2, N0)  Invasive ductal carcinoma on biopsy, ER - 97%, PR - 75%, Ki67 - 44%, Her2Neu - Positive  MRI - 06/08/2013 - 2.9 x 2.1 x1.9 cm  Oncology - Dr. Glendora Score. Murray   Left breast lumpecotmy and left axillary SNLBx - 07/26/2013  Final path Invasive ductal ca, Grade 3, 3.0 cm, 0/2 nodes.  She has done well with lumpectomy.  Her chemotherapy starts 08/31/2013. She will see me back in 6 month.s  1A.  Power port placed, right subclavian - 07/26/2013 - D. Beryle Bagsby  2.  On thyroid replacement 3.  History of depression 4.  Benign moles x 3 removed from below right breast - 07/26/2013  Chief Complaint  Patient presents with  . Routine Post Op    lumpectomy   REFERRING PHYSICIAN: [name], NP  HISTORY OF PRESENT ILLNESS: Lori Vincent is a 58 y.o. (DOB: 1955/10/02)  white  female whose primary care physician is CURTIS,CAROL G, NP and comes to me today for left breast cancer. She comes by herself. She is doing well.  She has some numbness of her nipple and some burning of her left axilla.  But she said that she is tolerating it well.  I think she is to start chemo therapy on 09/04/2013.  She will be on the Herceptin for one year.  History of breast cancer (06/2013): Lori Vincent went for her annual mammogram on 04/26/2013.  She also had an [place] on 04/26/2013.  The report showed "two adjacent suspicious masses in the 6 o'clock position of the left breast."  For financial reasons, the patient waited until 06/04/2013 to have a biopsy of her left breast.  The path report of the left breast biopsy (Accession: [id number]) - 06/03/2013 - Invasive ductal carcinoma with DCIS.  She is not on hormone.  Her last period was about 1 year ago.  She has no family history of breast cancer.  She could not feel this mass, even after it was found on mammogram/US.   MRI - 06/08/2013 - shows Irregular enhancing  mass within the left breast compatible with  recently biopsy proven invasive ductal carcinoma. Extending along the posterior medial margin of this mass are 2 adjacent irregular masses when measured in total extending 2.9 cm. As these masses are  immediately adjacent to each other, they likely can be included in single surgical specimen.    Past Medical History  Diagnosis Date  . Breast cancer 06/03/13    left, 6:00 o'clock  . Thyroid disease     hypothyroidism  . Hypothyroidism   . Depression      Past Surgical History  Procedure Laterality Date  . Tonsillectomy  1972  . Cesarean section  04/1999  . Breast biopsy  1/14    lt  . Dilation and curettage of uterus    . Colonoscopy  2013  . Breast lumpectomy with needle localization and axillary sentinel lymph node bx Left 07/26/2013    Procedure: BREAST LUMPECTOMY WITH NEEDLE LOCALIZATION AND AXILLARY SENTINEL LYMPH NODE BX;  Surgeon: Shann Medal, MD;  Location: [facility];  Service: General;  Laterality: Left;  Marland Kitchen Mole removal Right 07/26/2013    Procedure: MOLE REMOVAL UNDER RIGHT BREAST;  Surgeon: Shann Medal, MD;  Location: [facility];  Service: General;  Laterality: Right;  . Portacath placement  Right 07/26/2013    Procedure: INSERTION PORT-A-CATH;  Surgeon: Shann Medal, MD;  Location: [facility];  Service: General;  Laterality: Right;     Current Outpatient Prescriptions  Medication Sig Dispense Refill  . citalopram (CELEXA) 10 MG tablet Take 10 mg by mouth daily.       Marland Kitchen HYDROcodone-acetaminophen (NORCO/VICODIN) 5-325 MG per tablet Take 1-2 tablets by mouth every 6 (six) hours as needed.  30 tablet  0  . levothyroxine (SYNTHROID, LEVOTHROID) 112 MCG tablet Take 112 mcg by mouth daily before breakfast. Take sat and sunday      . Multiple Vitamins-Minerals (CENTRUM SILVER PO) Take 1 tablet by mouth daily.      Marland Kitchen SYNTHROID 125 MCG tablet Take 125 mcg by mouth every morning.  Monday-Friday-5 days a week      . etodolac (LODINE) 400 MG tablet Take 400 mg by mouth 2 (two) times daily.       No current facility-administered medications for this visit.     Allergies  Allergen Reactions  . No Known Drug Allergy    REVIEW OF SYSTEMS: Endocrine:  No diabetes. Hypothyroid - has seen Dr. Kristeen Mans recently to adjust her thyroid doses.  SOCIAL and FAMILY HISTORY: Husband died 2009-07-05 secondary to leg sarcoma.  He was taken care over at [facility]. She has one daughter age 31.  Has two step children at college. 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   She has a friend that she went down to [facility] in 2012/07/05, right before her breast surgery.  She even delayed her surgery briefly for this.  PHYSICAL EXAM: BP 134/84  Pulse 72  Temp(Src) 98.6 F (37 C)  Resp 18  Ht _0  (1.727 m)  Wt 218 lb 3.2 oz (98.975 kg)  BMI 33.18 kg/m2  General: WN WF  who is alert and generally healthy appearing.  HEENT: Normal. Pupils equal. Neck: Supple. No mass.  No thyroid mass. Lymph Nodes:  No supraclavicular, cervical, or axillary adenopathy.  Left axillary incision okay. Breasts:  Right - no mass.  Port in upper right breast.  Incision from moles taken off look good.  Left - Scar at 6 o'clock and left axillary incsion, both look good. She as some mild redness in the left axilla, but I think this is okay.   DATA REVIEWED: Notes in Epic.  [name], MD,  [facility], [address]  [place], [place]    [place] Phone:  [phone] FAX:  [phone]

## 2013-08-18 NOTE — Telephone Encounter (Signed)
, °

## 2013-08-20 ENCOUNTER — Other Ambulatory Visit: Payer: BC Managed Care – PPO

## 2013-08-23 ENCOUNTER — Encounter: Payer: Self-pay | Admitting: Physician Assistant

## 2013-08-23 ENCOUNTER — Other Ambulatory Visit (HOSPITAL_BASED_OUTPATIENT_CLINIC_OR_DEPARTMENT_OTHER): Payer: BC Managed Care – PPO

## 2013-08-23 ENCOUNTER — Encounter (INDEPENDENT_AMBULATORY_CARE_PROVIDER_SITE_OTHER): Payer: Self-pay | Admitting: Surgery

## 2013-08-23 ENCOUNTER — Ambulatory Visit (INDEPENDENT_AMBULATORY_CARE_PROVIDER_SITE_OTHER): Payer: BC Managed Care – PPO | Admitting: Surgery

## 2013-08-23 ENCOUNTER — Encounter (INDEPENDENT_AMBULATORY_CARE_PROVIDER_SITE_OTHER): Payer: BC Managed Care – PPO | Admitting: Surgery

## 2013-08-23 ENCOUNTER — Ambulatory Visit (HOSPITAL_BASED_OUTPATIENT_CLINIC_OR_DEPARTMENT_OTHER): Payer: BC Managed Care – PPO | Admitting: Physician Assistant

## 2013-08-23 ENCOUNTER — Encounter: Payer: Self-pay | Admitting: Oncology

## 2013-08-23 ENCOUNTER — Telehealth: Payer: Self-pay | Admitting: Physician Assistant

## 2013-08-23 VITALS — BP 158/88 | HR 75 | Temp 98.3°F | Resp 18 | Ht 68.0 in | Wt 218.7 lb

## 2013-08-23 VITALS — BP 142/90 | Temp 98.2°F | Wt 218.0 lb

## 2013-08-23 DIAGNOSIS — F329 Major depressive disorder, single episode, unspecified: Secondary | ICD-10-CM

## 2013-08-23 DIAGNOSIS — C50512 Malignant neoplasm of lower-outer quadrant of left female breast: Secondary | ICD-10-CM

## 2013-08-23 DIAGNOSIS — E039 Hypothyroidism, unspecified: Secondary | ICD-10-CM

## 2013-08-23 DIAGNOSIS — F419 Anxiety disorder, unspecified: Secondary | ICD-10-CM

## 2013-08-23 DIAGNOSIS — C50919 Malignant neoplasm of unspecified site of unspecified female breast: Secondary | ICD-10-CM

## 2013-08-23 DIAGNOSIS — N61 Mastitis without abscess: Secondary | ICD-10-CM

## 2013-08-23 DIAGNOSIS — C50519 Malignant neoplasm of lower-outer quadrant of unspecified female breast: Secondary | ICD-10-CM

## 2013-08-23 DIAGNOSIS — F411 Generalized anxiety disorder: Secondary | ICD-10-CM

## 2013-08-23 DIAGNOSIS — F32A Depression, unspecified: Secondary | ICD-10-CM

## 2013-08-23 DIAGNOSIS — L0390 Cellulitis, unspecified: Secondary | ICD-10-CM

## 2013-08-23 DIAGNOSIS — K219 Gastro-esophageal reflux disease without esophagitis: Secondary | ICD-10-CM

## 2013-08-23 DIAGNOSIS — F3289 Other specified depressive episodes: Secondary | ICD-10-CM

## 2013-08-23 LAB — COMPREHENSIVE METABOLIC PANEL (CC13)
ALT: 14 U/L (ref 0–55)
AST: 15 U/L (ref 5–34)
Albumin: 3.8 g/dL (ref 3.5–5.0)
Alkaline Phosphatase: 84 U/L (ref 40–150)
Anion Gap: 10 mEq/L (ref 3–11)
BUN: 13.8 mg/dL (ref 7.0–26.0)
CALCIUM: 9.5 mg/dL (ref 8.4–10.4)
CHLORIDE: 106 meq/L (ref 98–109)
CO2: 26 meq/L (ref 22–29)
Creatinine: 0.8 mg/dL (ref 0.6–1.1)
Glucose: 127 mg/dl (ref 70–140)
Potassium: 3.7 mEq/L (ref 3.5–5.1)
Sodium: 142 mEq/L (ref 136–145)
Total Bilirubin: 0.47 mg/dL (ref 0.20–1.20)
Total Protein: 7 g/dL (ref 6.4–8.3)

## 2013-08-23 LAB — CBC WITH DIFFERENTIAL/PLATELET
BASO%: 1 % (ref 0.0–2.0)
BASOS ABS: 0.1 10*3/uL (ref 0.0–0.1)
EOS%: 4.9 % (ref 0.0–7.0)
Eosinophils Absolute: 0.3 10*3/uL (ref 0.0–0.5)
HEMATOCRIT: 37.3 % (ref 34.8–46.6)
HEMOGLOBIN: 12.2 g/dL (ref 11.6–15.9)
LYMPH#: 2 10*3/uL (ref 0.9–3.3)
LYMPH%: 30.7 % (ref 14.0–49.7)
MCH: 27.7 pg (ref 25.1–34.0)
MCHC: 32.7 g/dL (ref 31.5–36.0)
MCV: 84.8 fL (ref 79.5–101.0)
MONO#: 0.5 10*3/uL (ref 0.1–0.9)
MONO%: 8.3 % (ref 0.0–14.0)
NEUT#: 3.6 10*3/uL (ref 1.5–6.5)
NEUT%: 55.1 % (ref 38.4–76.8)
Platelets: 252 10*3/uL (ref 145–400)
RBC: 4.4 10*6/uL (ref 3.70–5.45)
RDW: 13.8 % (ref 11.2–14.5)
WBC: 6.5 10*3/uL (ref 3.9–10.3)

## 2013-08-23 MED ORDER — PROCHLORPERAZINE MALEATE 10 MG PO TABS: ORAL_TABLET | ORAL | Status: DC

## 2013-08-23 MED ORDER — DEXAMETHASONE 4 MG PO TABS: ORAL_TABLET | ORAL | Status: DC

## 2013-08-23 MED ORDER — CEPHALEXIN 500 MG PO CAPS: 500.0000 mg | ORAL_CAPSULE | Freq: Three times a day (TID) | ORAL | Status: DC

## 2013-08-23 MED ORDER — CITALOPRAM HYDROBROMIDE 20 MG PO TABS: 20.0000 mg | ORAL_TABLET | Freq: Every day | ORAL | Status: DC

## 2013-08-23 MED ORDER — LORAZEPAM 0.5 MG PO TABS: 0.5000 mg | ORAL_TABLET | Freq: Every evening | ORAL | Status: DC | PRN

## 2013-08-23 MED ORDER — OMEPRAZOLE 40 MG PO CPDR: 40.0000 mg | DELAYED_RELEASE_CAPSULE | Freq: Every day | ORAL | Status: DC

## 2013-08-23 MED ORDER — ONDANSETRON HCL 8 MG PO TABS: ORAL_TABLET | ORAL | Status: DC

## 2013-08-23 MED ORDER — LIDOCAINE-PRILOCAINE 2.5-2.5 % EX CREA
1.0000 | TOPICAL_CREAM | CUTANEOUS | Status: DC | PRN
Start: 2013-08-23 — End: 2014-11-16

## 2013-08-23 NOTE — Progress Notes (Signed)
Re:   Lori Vincent DOB:   September 12, 1955 MRN:   [id number]  ASSESSMENT AND PLAN: 1.  Left breast cancer, 6 o'clock (T2, N0)  Invasive ductal carcinoma on biopsy, ER - 97%, PR - 75%, Ki67 - 44%, Her2Neu - Positive  MRI - 06/08/2013 - 2.9 x 2.1 x1.9 cm  Oncology - Dr. Glendora Score. Vincent   Left breast lumpecotmy and left axillary SNLBx - 07/26/2013  Final path Invasive ductal ca, Grade 3, 3.0 cm, 0/2 nodes.  She is supposed to start chemotherapy on 08/31/2013.   1A.  Some redness around left axilla, but more pain at 3 o'clock on left breast.  Question cellulitis.  Lori Vincent saw her today and put her on Keflex.  i saw no evidence of seroma on [place].  I agree with plan.  I'll see her back in 7 to 10 days.  1B.  Power port placed, right subclavian - 07/26/2013 - Lori Vincent 2.  On thyroid replacement 3.  History of depression 4.  Benign moles x 3 removed from below right breast - 07/26/2013  Chief Complaint  Patient presents with  . Routine Post Op    left breast red swollen   REFERRING PHYSICIAN: [name], NP  HISTORY OF PRESENT ILLNESS: Lori Vincent is a 58 y.o. (DOB: 05/14/1956)  white  female whose primary care physician is Lori G, NP and comes to me today for left breast cancer. She comes by herself. She has some increasing tenderness of her left breast at the 3 o'clock position.  She saw Lori Vincent at Dr. Virgie Vincent office today who was worried about cellulitis and put her on Keflex x 10 days. She's had no fever and no drainage.  History of breast cancer (06/2013): [name] went for her annual mammogram on 04/26/2013.  She also had an [place] on 04/26/2013.  The report showed "two adjacent suspicious masses in the 6 o'clock position of the left breast."  For financial reasons, the patient waited until 06/04/2013 to have a biopsy of her left breast.  The path report of the left breast biopsy (Accession: [id number]) - 06/03/2013 - Invasive ductal carcinoma with DCIS.  She is not on hormone.  Her last  period was about 1 year ago.  She has no family history of breast cancer.  She could not feel this mass, even after it was found on mammogram/US.   MRI - 06/08/2013 - shows Irregular enhancing mass within the left breast compatible with  recently biopsy proven invasive ductal carcinoma. Extending along the posterior medial margin of this mass are 2 adjacent irregular masses when measured in total extending 2.9 cm. As these masses are  immediately adjacent to each other, they likely can be included in single surgical specimen.    Past Medical History  Diagnosis Date  . Breast cancer 06/03/13    left, 6:00 o'clock  . Thyroid disease     hypothyroidism  . Hypothyroidism   . Depression      Past Surgical History  Procedure Laterality Date  . Tonsillectomy  1972  . Cesarean section  04/1999  . Breast biopsy  1/14    lt  . Dilation and curettage of uterus    . Colonoscopy  2013  . Breast lumpectomy with needle localization and axillary sentinel lymph node bx Left 07/26/2013    Procedure: BREAST LUMPECTOMY WITH NEEDLE LOCALIZATION AND AXILLARY SENTINEL LYMPH NODE BX;  Surgeon: Lori Medal, MD;  Location: [facility];  Service: General;  Laterality: Left;  Marland Kitchen Mole removal Right 07/26/2013    Procedure: MOLE REMOVAL UNDER RIGHT BREAST;  Surgeon: Lori Medal, MD;  Location: [facility];  Service: General;  Laterality: Right;  . Portacath placement Right 07/26/2013    Procedure: INSERTION PORT-A-CATH;  Surgeon: Lori Medal, MD;  Location: [facility];  Service: General;  Laterality: Right;     Current Outpatient Prescriptions  Medication Sig Dispense Refill  . citalopram (CELEXA) 20 MG tablet Take 1 tablet (20 mg total) by mouth daily.  30 tablet  5  . dexamethasone (DECADRON) 4 MG tablet 2 tabs by mouth with food twice daily on day before and 3 days after each chemo dose  30 tablet  2  . etodolac (LODINE) 400 MG tablet Take 400 mg by mouth 2 (two)  times daily.      Marland Kitchen HYDROcodone-acetaminophen (NORCO/VICODIN) 5-325 MG per tablet Take 1-2 tablets by mouth every 6 (six) hours as needed.  30 tablet  0  . levothyroxine (SYNTHROID, LEVOTHROID) 112 MCG tablet Take 112 mcg by mouth daily before breakfast. Take sat and sunday      . lidocaine-prilocaine (EMLA) cream Apply 1 application topically as needed. 1-2 hr before each port access  30 Vincent  5  . LORazepam (ATIVAN) 0.5 MG tablet Take 1 tablet (0.5 mg total) by mouth at bedtime as needed for anxiety or sleep ((or nausea)).  30 tablet  0  . Multiple Vitamins-Minerals (CENTRUM SILVER PO) Take 1 tablet by mouth daily.      Marland Kitchen omeprazole (PRILOSEC) 40 MG capsule Take 1 capsule (40 mg total) by mouth daily.  30 capsule  4  . ondansetron (ZOFRAN) 8 MG tablet 1 tab by mouth twice daily x 3 days after chemo, then 1 tab by mouth every 12 hrs if needed for nausea  30 tablet  2  . prochlorperazine (COMPAZINE) 10 MG tablet 1 tab by mouth with meals and bedtime x 3 days after chemo, then 1 tab by mouth every 6 hrs if needed for nausea  30 tablet  2  . SYNTHROID 125 MCG tablet Take 125 mcg by mouth every morning. Monday-Friday-5 days a week      . cephALEXin (KEFLEX) 500 MG capsule Take 1 capsule (500 mg total) by mouth 3 (three) times daily.  30 capsule  0   No current facility-administered medications for this visit.     Allergies  Allergen Reactions  . No Known Drug Allergy    REVIEW OF SYSTEMS: Endocrine:  No diabetes. Hypothyroid - has seen Lori Vincent recently to adjust her thyroid doses.  SOCIAL and FAMILY HISTORY: Husband died July 15, 2009 secondary to leg sarcoma.  He was taken care over at [facility]. She has one daughter age 37.  Has two step children at college. 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   She has a friend that she went down to [facility] in 07/15/2012, right before her breast surgery.  She even delayed her surgery briefly for this.  PHYSICAL EXAM: BP 142/90  Temp(Src) 98.2 F  (36.8 C) (Oral)  Wt 218 lb (98.884 kg)  General: WN WF  who is alert and generally healthy appearing.  Breasts:  Left - Scar at 6 o'clock and left axillary incsion.  She has some redness around the axillary incision.  But is more tender to palpation at the 3 o'clock position of the left breast, slightly lateral to the scar.  [place] of left  breast:  I did an ultrasound in the office.  She has no obvious fluid or seroma, so there is nothing to aspirate.  DATA REVIEWED: Notes in Epic.  [name], MD,  [facility], [address]  [place], [place]    [place] Phone:  [phone] FAX:  [phone]

## 2013-08-23 NOTE — Progress Notes (Signed)
Lori Vincent  [phone] Fax:([phone]) [phone]     ID: Lori Vincent OB: Jun 04, 1955  MR#: [id number]  GGE#:[id number]  PCP: Lori Fusi, NP GYN:  Lori Vincent SU: Lori Vincent OTHER MD: Lori Vincent  CHIEF COMPLAINT: Left breast cancer/adjuvant chemotherapy   HISTORY OF PRESENT ILLNESS: Lori Vincent [facility] showing a possible mass in the left breast. Diagnostic left mammography and ultrasonography at the breast center 04/26/2013 confirmed an oval mass with microcalcifications in the 6:00 position of the left breast. There was an adjacent partially obscured 1.2 cm mass. On physical exam there was no palpable mass in the left breast. By ultrasonography, there was an oval mass at the 6:00 position measuring 1.4 cm, with a second mass measuring 1.1 cm. There were no abnormal lymph nodes noted in the left axilla.  Biopsy of the 6:00 mass in the left breast January 2015 showed ([id number]) and invasive ductal carcinoma, grade 3, estrogen receptor 97% positive, progesterone receptor 75% positive, with strong staining intensity, and MIB-144%. HER-2 was amplified, with the signals ratio 4.18 and in number per cell of 9.40 .  On 06/08/2013 the patient underwent bilateral breast MRI which showed a dumbbell-shaped area in the central left breast consisting of a 2 cm mass and 2 extensions or an adjacent masses, measuring a total of 2.9 cm. There were no abnormal appearing lymph nodes for any other areas of concern.  The patient's subsequent history is as detailed below.   INTERVAL HISTORY: Lori Vincent returns alone today for followup of her left breast cancer. She is here today to discuss her upcoming adjuvant chemotherapy which will consist of 6 q. three-week doses of docetaxel/carboplatin given along with trastuzumab/pertuzumab, and she is hoping to initiate therapy next week on April 9.  She is extremely anxious about  today's visit, and about her upcoming therapy.  Since her last visit here, Lori Vincent has been doing well. She has attended chemotherapy class. She had an echocardiogram which showed a good ejection fraction of 55-60%. She also had her port placed in early March with no complications.   Physically, her biggest complaints today are continued joint pain and arthritis which is chronic and stable, and pain around the axillary incision on the left which has actually worsened over the past couple of days. She also believes the area is more red and warm her to touch, but she denies any drainage. She's had no fevers or chills.  REVIEW OF SYSTEMS: Lori Vincent denies any rashes or skin changes other than the redness around the incision as noted above. She's had no abnormal bruising or bleeding. Her energy level is fair. She's eating and drinking well no nausea or change in bowel or bladder habits. She's had no cough, increased shortness of breath, orthopnea, peripheral swelling, chest pain, palpitations. She denies any recent headaches or dizziness and has had no change in vision. She does feel anxious and depressed but denies suicidal ideation. She's currently on Celexa at a low dose of 10 mg daily. She has joint pain for which she takes Advil regularly and affectively. She denies any new or unusual myalgias, arthralgias, or bony pain. At baseline, she also denies any problems with peripheral neuropathy in either the upper or lower extremities.  A detailed review of systems is otherwise stable and noncontributory.    PAST MEDICAL HISTORY: Past Medical History  Diagnosis Date  . Breast cancer 06/03/13    left, 6:00 o'clock  . Thyroid disease  hypothyroidism  . Hypothyroidism   . Depression     PAST SURGICAL HISTORY: Past Surgical History  Procedure Laterality Date  . Tonsillectomy  1972  . Cesarean section  04/1999  . Breast biopsy  1/14    lt  . Dilation and curettage of uterus    . Colonoscopy  2013   . Breast lumpectomy with needle localization and axillary sentinel lymph node bx Left 07/26/2013    Procedure: BREAST LUMPECTOMY WITH NEEDLE LOCALIZATION AND AXILLARY SENTINEL LYMPH NODE BX;  Surgeon: Lori Medal, MD;  Location: [facility];  Service: General;  Laterality: Left;  Marland Kitchen Mole removal Right 07/26/2013    Procedure: MOLE REMOVAL UNDER RIGHT BREAST;  Surgeon: Lori Medal, MD;  Location: [facility];  Service: General;  Laterality: Right;  . Portacath placement Right 07/26/2013    Procedure: INSERTION PORT-A-CATH;  Surgeon: Lori Medal, MD;  Location: [facility];  Service: General;  Laterality: Right;    FAMILY HISTORY Family History  Problem Relation Age of Onset  . Stroke Mother   . Diabetes Father   . Cancer Maternal Grandmother 4    colon   the patient's father died at the age of 63 in a car accident. The patient's mother died at the age of 14 from a stroke. The patient has one adopted brother. She has one biological sister. The patient's mother's mother was diagnosed with colon cancer at the age of 40. There is no history of breast oropharynx cancer in the family to the patient's knowledge.  GYNECOLOGIC HISTORY:  Menarche age 89. First live birth age 35. The patient is GX P1. She took birth control pills for approximately 12 years remotely. She had a period in February of 2014 and then in December of 2014. She had been started on hormone replacement in December. That has since been discontinued.  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 Her husband died at [place] 3 years ago with soft tissue sarcoma. The patient's significant other also works as a Geophysicist/field seismologist. At home it is just the patient and her daughter 61, 46 years old. The patient has 2 stepchildren from her earlier marriage. 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  ADVANCED DIRECTIVES: Not in place   HEALTH MAINTENANCE:  (Updated 08/23/2013) History  Substance Use Topics  . Smoking status: Never Smoker   . Smokeless tobacco: Never Used  . Alcohol Use: Yes     Comment: 5-7 glasses per week of red wine     Colonoscopy: July 2014  PAP: November 2014  Bone density: November 2014  Lipid panel: Not on file  Allergies  Allergen Reactions  . No Known Drug Allergy     Current Outpatient Prescriptions  Medication Sig Dispense Refill  . HYDROcodone-acetaminophen (NORCO/VICODIN) 5-325 MG per tablet Take 1-2 tablets by mouth every 6 (six) hours as needed.  30 tablet  0  . levothyroxine (SYNTHROID, LEVOTHROID) 112 MCG tablet Take 112 mcg by mouth daily before breakfast. Take sat and sunday      . Multiple Vitamins-Minerals (CENTRUM SILVER PO) Take 1 tablet by mouth daily.      Marland Kitchen SYNTHROID 125 MCG tablet Take 125 mcg by mouth every morning. Monday-Friday-5 days a week      . cephALEXin (KEFLEX) 500 MG capsule Take 1  capsule (500 mg total) by mouth 3 (three) times daily.  30 capsule  0  . citalopram (CELEXA) 20 MG tablet Take 1 tablet (20 mg total) by mouth daily.  30 tablet  5  . dexamethasone (DECADRON) 4 MG tablet 2 tabs by mouth with food twice daily on day before and 3 days after each chemo dose  30 tablet  2  . etodolac (LODINE) 400 MG tablet Take 400 mg by mouth 2 (two) times daily.      Marland Kitchen lidocaine-prilocaine (EMLA) cream Apply 1 application topically as needed. 1-2 hr before each port access  30 g  5  . LORazepam (ATIVAN) 0.5 MG tablet Take 1 tablet (0.5 mg total) by mouth at bedtime as needed for anxiety or sleep ((or nausea)).  30 tablet  0  . omeprazole (PRILOSEC) 40 MG capsule Take 1 capsule (40 mg total) by mouth daily.  30 capsule  4  . ondansetron (ZOFRAN) 8 MG tablet 1 tab by mouth twice daily x 3 days after chemo, then 1 tab by mouth every 12 hrs if needed for nausea  30  tablet  2  . prochlorperazine (COMPAZINE) 10 MG tablet 1 tab by mouth with meals and bedtime x 3 days after chemo, then 1 tab by mouth every 6 hrs if needed for nausea  30 tablet  2   No current facility-administered medications for this visit.    OBJECTIVE: Middle-aged white woman who appears younger than stated age VITALS: BP: 158/88 Pulse: 75 Temp: 98.3 Resp: 18  Body mass index is 33.26 kg/(m^2).    ECOG FS:1 - Symptomatic but completely ambulatory Filed Weights   08/23/13 0946  Weight: 218 lb 11.2 oz (99.202 kg)   Physical Exam: HEENT:  Sclerae anicteric.  Oropharynx clear and moist. Neck supple, trachea midline.  NODES:  No cervical or supraclavicular lymphadenopathy palpated.  BREAST EXAM: Right breast is unremarkable. Left breast is status post lumpectomy with well-healed incision, the incision showing no excessive sensitivity to palpation, no erythema, no drainage. The lateral portion of the left breast, however, is mildly erythematous, and this erythema worsens approaching the axillary incision. The erythema is the greatest in the lower portion of the axillary incision, is also extremely tender to touch, and warm to touch. There is no evidence of drainage, no bleeding, and no swelling. Right axilla is unremarkable. LUNGS:  Clear to auscultation bilaterally.  No wheezes or rhonchi HEART:  Regular rate and rhythm. No murmur appreciated. ABDOMEN:  Soft, nontender.  Positive bowel sounds.  MSK:  No focal spinal tenderness to palpation. Good range of motion bilaterally in the upper extremities. EXTREMITIES:  No peripheral edema.   SKIN:  Benign with no visible rashes or skin lesions other than the erythema noted in the left lateral breast and left axilla as noted above. No excessive ecchymoses. No petechiae. No pallor. Port is intact in the right upper chest wall with no erythema, edema, or evidence of infection/cellulitis. NEURO:  Nonfocal. Well oriented.  Appropriate  affect.    LAB RESULTS: CBC    Component Value Date/Time   WBC 6.5 08/23/2013 0928   RBC 4.40 08/23/2013 0928   HGB 12.2 08/23/2013 0928   HCT 37.3 08/23/2013 0928   PLT 252 08/23/2013 0928   MCV 84.8 08/23/2013 0928   MCH 27.7 08/23/2013 0928   MCHC 32.7 08/23/2013 0928   RDW 13.8 08/23/2013 0928   LYMPHSABS 2.0 08/23/2013 0928   MONOABS 0.5 08/23/2013 0928   EOSABS 0.3 08/23/2013  0928   BASOSABS 0.1 08/23/2013 0928    CMP     Component Value Date/Time   NA 142 08/23/2013 0929   K 3.7 08/23/2013 0929   CO2 26 08/23/2013 0929   GLUCOSE 127 08/23/2013 0929   BUN 13.8 08/23/2013 0929   CREATININE 0.8 08/23/2013 0929   CALCIUM 9.5 08/23/2013 0929   PROT 7.0 08/23/2013 0929   ALBUMIN 3.8 08/23/2013 0929   AST 15 08/23/2013 0929   ALT 14 08/23/2013 0929   ALKPHOS 84 08/23/2013 0929   BILITOT 0.47 08/23/2013 0929      Lab Results  Component Value Date   WBC 6.5 08/23/2013      Chemistry      Component Value Date/Time   NA 142 08/23/2013 0929      Component Value Date/Time   CALCIUM 9.5 08/23/2013 0929       STUDIES:  Dg Chest Port 1 View 07/26/2013   CLINICAL DATA:  Port-A-Cath placement.  EXAM: PORTABLE CHEST - 1 VIEW  COMPARISON:  None.  FINDINGS: Right subclavian Port-A-Cath is present. Catheter tip in the lower SVC region. Evidence for subcutaneous gas along the left hemithorax. There is no evidence for a pneumothorax. Low lung volumes. Prominent lung markings are probably related to the low lung volumes rather than edema. Heart size is within normal limits.  IMPRESSION: Port-A-Cath tip in the lower SVC.  No evidence for a pneumothorax.  Prominent lung markings probably related to technique and low lung volumes. Mild edema cannot be excluded.   Electronically Signed   By: Markus Daft M.D.   On: 07/26/2013 15:36    ASSESSMENT: 58 y.o. [place] woman, status post left breast biopsy 06/03/2013 for a clinical T2 N0, stage IIA invasive ductal carcinoma, grade 3, estrogen and progesterone  receptor positive, with an MIB-1 of 44%, and HER-2 amplified.  (1) status post left lumpectomy and sentinel lymph node sampling 07-2013 for a pT2 pN0, stage IIA invasive ductal carcinoma, grade 3, with negative margins.  (2) being treated in the adjuvant setting, the plan being to treat with 6 q. three-week doses of docetaxel/carboplatin (AUC 5) given along with trastuzumab/pertuzumab, first dose on 09/02/2013. Neulasta on day 2 for granulocyte support.  (3) trastuzumab and pertuzumab to continue to total one year  (4) adjuvant radiation to follow chemotherapy  (5) adjuvant anti-estrogens to follow radiation  (6)  possible early cellulitis in the left breast/left axilla  (7)  Depression/anxiety   PLAN: The majority of our one-hour appointment today was spent answering the patient's questions, reviewing our treatment plan now that she has not been treated according to protocol, reviewing possible side effects, reviewing her current medications, prescribing necessary antinausea medications, and coordinating care.  The plan is to initiate her first dose of adjuvant chemotherapy next week on April 9. We reviewed all of her antinausea medications which will include the following: Dexamethasone (8 mg by mouth with food twice daily on the day before and 3 days after each chemotherapy cycle); prochlorperazine (10 mg by mouth with meals and bedtime x3 days after chemotherapy, then one tablet by mouth every 6 hours as needed for nausea); lorazepam (0.5 mg at bedtime as needed for anxiety/insomnia/nausea); ondansetron (8 mg by mouth twice a day x3 days after chemotherapy, then up to twice daily as needed for nausea).   we also reviewed the use of Claritin, 10 mg daily, for 5-7 days following the injection of Neulasta on day 2. She can also utilize Tylenol or ibuprofen as  needed.   Since she already utilizes ibuprofen over regular basis, and since she will also be on oral steroid, I have suggested going  ahead and starting on omeprazole prophylactically, 40 mg daily to prevent reflux.    Ebony Hail is already on Celexa at 10 mg daily, and has been on this dose now for approximately 3 months. We're going to increase slightly to 20 mg which should be safe and should not cause any interaction between the Celexa and the omeprazole.  She was given all the above information in writing today and voiced her understanding of how to utilize all of these medications appropriately.  Finally, I am starting Ebony Hail on Keflex, 500 mg by mouth 3 times a day for the next 10 days for what appears to be an early cellulitis in the left breast and left axillary incision. I have recommended that she see Dr. Lucia Gaskins again later this week for reevaluation to make sure the area is healing appropriately with no sign of infection. Certainly she should call us sooner if area worsens, or if she has any fevers or chills.  Otherwise, she will return for treatment on April 9, Neulasta on April 10, and I will see her the following week on April 17 for assessment of chemotoxicity. We'll continue to follow Ebony Hail very closely, and she knows to call with any changes or problems. She does voice understanding and agreement with our plan, and understands that the goal treatment is cure.   Micah Flesher, PA-C   08/23/2013 5:34 PM

## 2013-08-23 NOTE — Telephone Encounter (Signed)
, °

## 2013-08-25 ENCOUNTER — Other Ambulatory Visit: Payer: BC Managed Care – PPO

## 2013-08-25 ENCOUNTER — Other Ambulatory Visit: Payer: Self-pay | Admitting: Oncology

## 2013-08-25 ENCOUNTER — Ambulatory Visit: Payer: BC Managed Care – PPO | Admitting: Physician Assistant

## 2013-08-26 ENCOUNTER — Telehealth: Payer: Self-pay | Admitting: Oncology

## 2013-08-26 ENCOUNTER — Telehealth: Payer: Self-pay | Admitting: *Deleted

## 2013-08-26 NOTE — Telephone Encounter (Signed)
, °

## 2013-08-26 NOTE — Telephone Encounter (Signed)
[facility] staff message I have adjusted 4/9 appt

## 2013-08-27 ENCOUNTER — Telehealth: Payer: Self-pay | Admitting: *Deleted

## 2013-08-27 NOTE — Telephone Encounter (Signed)
Mailed after appt letter to pt. 

## 2013-08-30 ENCOUNTER — Encounter (INDEPENDENT_AMBULATORY_CARE_PROVIDER_SITE_OTHER): Payer: BC Managed Care – PPO | Admitting: Surgery

## 2013-09-02 ENCOUNTER — Other Ambulatory Visit: Payer: BC Managed Care – PPO

## 2013-09-02 ENCOUNTER — Other Ambulatory Visit: Payer: Self-pay | Admitting: Physician Assistant

## 2013-09-02 ENCOUNTER — Other Ambulatory Visit (HOSPITAL_BASED_OUTPATIENT_CLINIC_OR_DEPARTMENT_OTHER): Payer: BC Managed Care – PPO

## 2013-09-02 ENCOUNTER — Ambulatory Visit (HOSPITAL_BASED_OUTPATIENT_CLINIC_OR_DEPARTMENT_OTHER): Payer: BC Managed Care – PPO

## 2013-09-02 ENCOUNTER — Encounter: Payer: Self-pay | Admitting: Physician Assistant

## 2013-09-02 ENCOUNTER — Ambulatory Visit (HOSPITAL_BASED_OUTPATIENT_CLINIC_OR_DEPARTMENT_OTHER): Payer: BC Managed Care – PPO | Admitting: Physician Assistant

## 2013-09-02 ENCOUNTER — Ambulatory Visit: Payer: BC Managed Care – PPO

## 2013-09-02 VITALS — BP 142/70 | HR 69 | Temp 98.0°F | Resp 16

## 2013-09-02 VITALS — BP 157/79 | HR 84 | Temp 98.4°F | Resp 18 | Ht 68.0 in | Wt 218.3 lb

## 2013-09-02 DIAGNOSIS — E039 Hypothyroidism, unspecified: Secondary | ICD-10-CM

## 2013-09-02 DIAGNOSIS — K219 Gastro-esophageal reflux disease without esophagitis: Secondary | ICD-10-CM

## 2013-09-02 DIAGNOSIS — C50512 Malignant neoplasm of lower-outer quadrant of left female breast: Secondary | ICD-10-CM

## 2013-09-02 DIAGNOSIS — F419 Anxiety disorder, unspecified: Secondary | ICD-10-CM

## 2013-09-02 DIAGNOSIS — F411 Generalized anxiety disorder: Secondary | ICD-10-CM

## 2013-09-02 DIAGNOSIS — Z5111 Encounter for antineoplastic chemotherapy: Secondary | ICD-10-CM

## 2013-09-02 DIAGNOSIS — C50919 Malignant neoplasm of unspecified site of unspecified female breast: Secondary | ICD-10-CM

## 2013-09-02 DIAGNOSIS — Z170 Estrogen receptor positive status [ER+]: Secondary | ICD-10-CM

## 2013-09-02 DIAGNOSIS — Z5112 Encounter for antineoplastic immunotherapy: Secondary | ICD-10-CM

## 2013-09-02 DIAGNOSIS — F341 Dysthymic disorder: Secondary | ICD-10-CM

## 2013-09-02 LAB — COMPREHENSIVE METABOLIC PANEL (CC13)
ALBUMIN: 4.2 g/dL (ref 3.5–5.0)
ALK PHOS: 97 U/L (ref 40–150)
ALT: 22 U/L (ref 0–55)
AST: 20 U/L (ref 5–34)
Anion Gap: 13 mEq/L — ABNORMAL HIGH (ref 3–11)
BUN: 16.1 mg/dL (ref 7.0–26.0)
CHLORIDE: 106 meq/L (ref 98–109)
CO2: 22 mEq/L (ref 22–29)
Calcium: 9.8 mg/dL (ref 8.4–10.4)
Creatinine: 0.9 mg/dL (ref 0.6–1.1)
Glucose: 209 mg/dl — ABNORMAL HIGH (ref 70–140)
Potassium: 3.7 mEq/L (ref 3.5–5.1)
SODIUM: 141 meq/L (ref 136–145)
TOTAL PROTEIN: 7.6 g/dL (ref 6.4–8.3)
Total Bilirubin: 0.45 mg/dL (ref 0.20–1.20)

## 2013-09-02 LAB — CBC WITH DIFFERENTIAL/PLATELET
BASO%: 0 % (ref 0.0–2.0)
Basophils Absolute: 0 10*3/uL (ref 0.0–0.1)
EOS%: 0 % (ref 0.0–7.0)
Eosinophils Absolute: 0 10*3/uL (ref 0.0–0.5)
HCT: 38.2 % (ref 34.8–46.6)
HGB: 12.6 g/dL (ref 11.6–15.9)
LYMPH%: 9.1 % — ABNORMAL LOW (ref 14.0–49.7)
MCH: 27.8 pg (ref 25.1–34.0)
MCHC: 33 g/dL (ref 31.5–36.0)
MCV: 84.1 fL (ref 79.5–101.0)
MONO#: 0.2 10*3/uL (ref 0.1–0.9)
MONO%: 1.8 % (ref 0.0–14.0)
NEUT#: 8 10*3/uL — ABNORMAL HIGH (ref 1.5–6.5)
NEUT%: 89.1 % — ABNORMAL HIGH (ref 38.4–76.8)
Platelets: 247 10*3/uL (ref 145–400)
RBC: 4.54 10*6/uL (ref 3.70–5.45)
RDW: 14.1 % (ref 11.2–14.5)
WBC: 9 10*3/uL (ref 3.9–10.3)
lymph#: 0.8 10*3/uL — ABNORMAL LOW (ref 0.9–3.3)

## 2013-09-02 LAB — MAGNESIUM (CC13): Magnesium: 2.3 mg/dl (ref 1.5–2.5)

## 2013-09-02 MED ORDER — ACETAMINOPHEN 325 MG PO TABS
650.0000 mg | ORAL_TABLET | Freq: Once | ORAL | Status: AC
  Administered 2013-09-02: 650 mg via ORAL

## 2013-09-02 MED ORDER — ONDANSETRON 16 MG/50ML IVPB (CHCC)
INTRAVENOUS | Status: AC
  Filled 2013-09-02: qty 16

## 2013-09-02 MED ORDER — DIPHENHYDRAMINE HCL 25 MG PO CAPS
ORAL_CAPSULE | ORAL | Status: AC
  Filled 2013-09-02: qty 2

## 2013-09-02 MED ORDER — ACETAMINOPHEN 325 MG PO TABS
ORAL_TABLET | ORAL | Status: AC
Start: 2013-09-02 — End: 2013-09-02
  Filled 2013-09-02: qty 2

## 2013-09-02 MED ORDER — ONDANSETRON 16 MG/50ML IVPB (CHCC)
16.0000 mg | Freq: Once | INTRAVENOUS | Status: AC
  Administered 2013-09-02: 16 mg via INTRAVENOUS

## 2013-09-02 MED ORDER — DIPHENHYDRAMINE HCL 25 MG PO CAPS
50.0000 mg | ORAL_CAPSULE | Freq: Once | ORAL | Status: AC
  Administered 2013-09-02: 50 mg via ORAL

## 2013-09-02 MED ORDER — SODIUM CHLORIDE 0.9 % IV SOLN
750.0000 mg | Freq: Once | INTRAVENOUS | Status: AC
  Administered 2013-09-02: 750 mg via INTRAVENOUS
  Filled 2013-09-02: qty 75

## 2013-09-02 MED ORDER — DEXAMETHASONE SODIUM PHOSPHATE 20 MG/5ML IJ SOLN
20.0000 mg | Freq: Once | INTRAMUSCULAR | Status: AC
  Administered 2013-09-02: 20 mg via INTRAVENOUS

## 2013-09-02 MED ORDER — DOCETAXEL CHEMO INJECTION 160 MG/16ML
75.0000 mg/m2 | Freq: Once | INTRAVENOUS | Status: AC
  Administered 2013-09-02: 170 mg via INTRAVENOUS
  Filled 2013-09-02: qty 17

## 2013-09-02 MED ORDER — SODIUM CHLORIDE 0.9 % IV SOLN
840.0000 mg | Freq: Once | INTRAVENOUS | Status: AC
  Administered 2013-09-02: 840 mg via INTRAVENOUS
  Filled 2013-09-02: qty 28

## 2013-09-02 MED ORDER — DEXAMETHASONE SODIUM PHOSPHATE 20 MG/5ML IJ SOLN
INTRAMUSCULAR | Status: AC
  Filled 2013-09-02: qty 5

## 2013-09-02 MED ORDER — SODIUM CHLORIDE 0.9 % IV SOLN
Freq: Once | INTRAVENOUS | Status: AC
  Administered 2013-09-02: 11:00:00 via INTRAVENOUS

## 2013-09-02 MED ORDER — SODIUM CHLORIDE 0.9 % IJ SOLN
10.0000 mL | INTRAMUSCULAR | Status: DC | PRN
  Administered 2013-09-02: 10 mL
  Filled 2013-09-02: qty 10

## 2013-09-02 MED ORDER — LORAZEPAM 2 MG/ML IJ SOLN
INTRAMUSCULAR | Status: AC
  Filled 2013-09-02: qty 1

## 2013-09-02 MED ORDER — LORAZEPAM 2 MG/ML IJ SOLN
0.5000 mg | Freq: Once | INTRAMUSCULAR | Status: AC | PRN
  Administered 2013-09-02: 0.5 mg via INTRAVENOUS

## 2013-09-02 MED ORDER — TRASTUZUMAB CHEMO INJECTION 440 MG
8.0000 mg/kg | Freq: Once | INTRAVENOUS | Status: AC
  Administered 2013-09-02: 798 mg via INTRAVENOUS
  Filled 2013-09-02: qty 38

## 2013-09-02 MED ORDER — HEPARIN SOD (PORK) LOCK FLUSH 100 UNIT/ML IV SOLN
500.0000 [IU] | Freq: Once | INTRAVENOUS | Status: AC | PRN
  Administered 2013-09-02: 500 [IU]
  Filled 2013-09-02: qty 5

## 2013-09-02 MED ORDER — LORAZEPAM 2 MG/ML IJ SOLN
0.5000 mg | Freq: Once | INTRAMUSCULAR | Status: AC
Start: 2013-09-02 — End: 2013-09-02
  Administered 2013-09-02: 0.5 mg via INTRAVENOUS

## 2013-09-02 NOTE — Progress Notes (Signed)
Patient completed day one of treatment without any complications. Patient discharged home. Cindi Carbon, RN

## 2013-09-02 NOTE — Progress Notes (Signed)
[facility]  [phone] [phone]     ID: Lori Vincent OB: [birth year]  MR#: [id number]  KJZ#:[id number]  PCP: [name], NP GYN:  [name], MD SU: [name], MD OTHER MD: Arloa Koh, MD  CHIEF COMPLAINT: Left breast cancer/adjuvant chemotherapy   HISTORY OF PRESENT ILLNESS: Lori Vincent [facility] showing a possible mass in the left breast. Diagnostic left mammography and ultrasonography at the breast center 04/26/2013 confirmed an oval mass with microcalcifications in the 6:00 position of the left breast. There was an adjacent partially obscured 1.2 cm mass. On physical exam there was no palpable mass in the left breast. By ultrasonography, there was an oval mass at the 6:00 position measuring 1.4 cm, with a second mass measuring 1.1 cm. There were no abnormal lymph nodes noted in the left axilla.  Biopsy of the 6:00 mass in the left breast January 2015 showed ([id number]) and invasive ductal carcinoma, grade 3, estrogen receptor 97% positive, progesterone receptor 75% positive, with strong staining intensity, and MIB-144%. HER-2 was amplified, with the signals ratio 4.18 and in number per cell of 9.40 .  On 06/08/2013 the patient underwent bilateral breast MRI which showed a dumbbell-shaped area in the central left breast consisting of a 2 cm mass and 2 extensions or an adjacent masses, measuring a total of 2.9 cm. There were no abnormal appearing lymph nodes for any other areas of concern.  The patient's subsequent history is as detailed below.   INTERVAL HISTORY: Lori Vincent returns alone today for followup of her left breast cancer. She is here today in anticipation of receiving her first dose of adjuvant chemotherapy. She is due for day 1 cycle 1 of 6 planned q. three-week doses of docetaxel/carboplatin given along with trastuzumab/pertuzumab given in the adjuvant setting. She  receives Neulasta on day 2 for granulocyte support.  When I saw Lori Vincent here on March 30, she had evidence of an early cellulitis in the surgical area of the left breast. She was started on Keflex, 500 mg by mouth 3 times daily for 10 days, and that area has improved significantly. The redness has resolved, as has the soreness. She's noted no additional skin changes, no additional signs of infection, and specifically has had no fevers or chills.  At her last appointment, we also started her on omeprazole 40 mg daily which has started to help with her reflux symptoms. She also increased her Celexa from 10 mg to 20 mg daily and feels perhaps a little less anxious. She is, however, extremely nervous about today's infusion, and we discussed a small dose of lorazepam IV at the beginning of her infusion today.  REVIEW OF SYSTEMS: Lori Vincent denies any additional rashes or skin changes, and has had no abnormal bruising or bleeding. Her energy level is fairly good today. She does have some difficulty sleeping at times. She continues to have chronic joint pain, especially in the feet, attributed to arthritis. This is stable, and she denies any new or unusual myalgias, arthralgias, or bony pain otherwise. At baseline, she denies any signs of peripheral neuropathy. Her appetite is good, and she denies any nausea or emesis. Her bowels tend to be slightly loose, but she denies actual diarrhea. She's had no recent change in urinary habits, specifically no dysuria or hematuria. She denies any abnormal headaches or dizziness and has had no change in vision. She also denies any increased cough, shortness of breath, orthopnea, peripheral swelling,  chest pain, or palpitations. Although she feels anxious, she denies any depression, and also denies suicidal ideation.  A detailed review of systems is otherwise stable and noncontributory.    PAST MEDICAL HISTORY: Past Medical History  Diagnosis Date  . Breast cancer 06/03/13     left, 6:00 o'clock  . Thyroid disease     hypothyroidism  . Hypothyroidism   . Depression     PAST SURGICAL HISTORY: Past Surgical History  Procedure Laterality Date  . Tonsillectomy  1972  . Cesarean section  04/1999  . Breast biopsy  1/14    lt  . Dilation and curettage of uterus    . Colonoscopy  2013  . Breast lumpectomy with needle localization and axillary sentinel lymph node bx Left 07/26/2013    Procedure: BREAST LUMPECTOMY WITH NEEDLE LOCALIZATION AND AXILLARY SENTINEL LYMPH NODE BX;  Surgeon: Shann Medal, MD;  Location: [facility];  Service: General;  Laterality: Left;  Marland Kitchen Mole removal Right 07/26/2013    Procedure: MOLE REMOVAL UNDER RIGHT BREAST;  Surgeon: Shann Medal, MD;  Location: [facility];  Service: General;  Laterality: Right;  . Portacath placement Right 07/26/2013    Procedure: INSERTION PORT-A-CATH;  Surgeon: Shann Medal, MD;  Location: [facility];  Service: General;  Laterality: Right;    FAMILY HISTORY Family History  Problem Relation Age of Onset  . Stroke Mother   . Diabetes Father   . Cancer Maternal Grandmother 50    colon   the patient's father died at the age of 37 in a car accident. The patient's mother died at the age of 67 from a stroke. The patient has one adopted brother. She has one biological sister. The patient's mother's mother was diagnosed with colon cancer at the age of 83. There is no history of breast oropharynx cancer in the family to the patient's knowledge.  GYNECOLOGIC HISTORY:   (Updated 09/02/2013) Menarche age 60. First live birth age 33. The patient is GX P1. She took birth control pills for approximately 12 years remotely. She had a period in February of 2014 and then in December of 2014. She had been started on hormone replacement in December. That has since been discontinued.  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 Her husband died at [place] 3 years ago with soft tissue sarcoma. The patient's significant other also works as a Geophysicist/field seismologist. At home it is just the patient and her daughter 17, 82 years old. The patient has 2 stepchildren from her earlier marriage. 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  ADVANCED DIRECTIVES: Not in place   HEALTH MAINTENANCE:  (Updated 09/02/2013) History  Substance Use Topics  . Smoking status: Never Smoker   . Smokeless tobacco: Never Used  . Alcohol Use: Yes     Comment: 5-7 glasses per week of red wine     Colonoscopy: July 2014  PAP: November 2014  Bone density: November 2014  Lipid panel: Not on file  Allergies  Allergen Reactions  . No Known Drug Allergy     Current Outpatient Prescriptions  Medication Sig Dispense Refill  . citalopram (CELEXA) 20 MG tablet Take 1 tablet (20 mg total) by mouth daily.  30 tablet  5  . dexamethasone (DECADRON) 4 MG tablet 2 tabs by mouth with  food twice daily on day before and 3 days after each chemo dose  30 tablet  2  . levothyroxine (SYNTHROID, LEVOTHROID) 112 MCG tablet Take 112 mcg by mouth daily before breakfast. Take sat and sunday      . lidocaine-prilocaine (EMLA) cream Apply 1 application topically as needed. 1-2 hr before each port access  30 g  5  . omeprazole (PRILOSEC) 40 MG capsule Take 1 capsule (40 mg total) by mouth daily.  30 capsule  4  . SYNTHROID 125 MCG tablet Take 125 mcg by mouth every morning. Monday-Friday-5 days a week      . HYDROcodone-acetaminophen (NORCO/VICODIN) 5-325 MG per tablet Take 1-2 tablets by mouth every 6 (six) hours as needed.  30 tablet  0  . LORazepam (ATIVAN) 0.5 MG tablet Take 1 tablet (0.5 mg total) by mouth at bedtime as needed for anxiety or sleep ((or nausea)).  30 tablet  0  . Multiple Vitamins-Minerals (CENTRUM SILVER PO) Take 1 tablet by mouth daily.       . ondansetron (ZOFRAN) 8 MG tablet 1 tab by mouth twice daily x 3 days after chemo, then 1 tab by mouth every 12 hrs if needed for nausea  30 tablet  2  . prochlorperazine (COMPAZINE) 10 MG tablet 1 tab by mouth with meals and bedtime x 3 days after chemo, then 1 tab by mouth every 6 hrs if needed for nausea  30 tablet  2   No current facility-administered medications for this visit.   Facility-Administered Medications Ordered in Other Visits  Medication Dose Route Frequency Provider Last Rate Last Dose  . CARBOplatin (PARAPLATIN) 750 mg in sodium chloride 0.9 % 250 mL chemo infusion  750 mg Intravenous Once Isauro Skelley G Kennedy Bohanon, PA-C      . heparin lock flush 100 unit/mL  500 Units Intracatheter Once PRN Shaquia Berkley Milda Smart, PA-C      . sodium chloride 0.9 % injection 10 mL  10 mL Intracatheter PRN Cambell Rickenbach Milda Smart, PA-C        OBJECTIVE: Middle-aged white woman who appears anxious but is in no acute distress Filed Vitals:   09/02/13 0943  BP: 157/79  Pulse: 84  Temp: 98.4 F (36.9 C)  Resp: 18   Body mass index is 33.2 kg/(m^2).    ECOG FS: 0 Filed Weights   09/02/13 0943  Weight: 218 lb 4.8 oz (99.02 kg)   Physical Exam: HEENT:  Sclerae anicteric.  Oropharynx clear and moist. Neck supple, trachea midline.  NODES:  No cervical or supraclavicular lymphadenopathy palpated.  BREAST EXAM:  Left breast is status post lumpectomy with well-healed incision. The lateral portion of the left breast and left axilla are no longer erythematous, no longer warm to touch, and is not tender to touch. There is currently no evidence of infection/cellulitis. Axillae are benign bilaterally, with no palpable lymphadenopathy.  LUNGS:  Clear to auscultation bilaterally with good excursion.  No wheezes or rhonchi HEART:  Regular rate and rhythm. No murmur appreciated. ABDOMEN:  Soft, nontender.  Positive bowel sounds. No organomegaly. MSK:  No focal spinal tenderness to palpation. Good range of motion bilaterally in the upper  extremities. EXTREMITIES:  No peripheral edema.  No lymphedema noted in the left upper extremity. SKIN:  Benign with no visible rashes or skin lesions. No excessive ecchymoses. No petechiae. No pallor. Port is intact in the right upper chest wall with no erythema, edema, or evidence of infection/cellulitis. NEURO:  Nonfocal. Well oriented. Anxious affect.  LAB RESULTS: CBC    Component Value Date/Time   WBC 9.0 09/02/2013 0914   RBC 4.54 09/02/2013 0914   HGB 12.6 09/02/2013 0914   HCT 38.2 09/02/2013 0914   PLT 247 09/02/2013 0914   MCV 84.1 09/02/2013 0914   MCH 27.8 09/02/2013 0914   MCHC 33.0 09/02/2013 0914   RDW 14.1 09/02/2013 0914   LYMPHSABS 0.8* 09/02/2013 0914   MONOABS 0.2 09/02/2013 0914   EOSABS 0.0 09/02/2013 0914   BASOSABS 0.0 09/02/2013 0914    CMP     Component Value Date/Time   NA 141 09/02/2013 0915   K 3.7 09/02/2013 0915   CO2 22 09/02/2013 0915   GLUCOSE 209* 09/02/2013 0915   BUN 16.1 09/02/2013 0915   CREATININE 0.9 09/02/2013 0915   CALCIUM 9.8 09/02/2013 0915   PROT 7.6 09/02/2013 0915   ALBUMIN 4.2 09/02/2013 0915   AST 20 09/02/2013 0915   ALT 22 09/02/2013 0915   ALKPHOS 97 09/02/2013 0915   BILITOT 0.45 09/02/2013 0915      STUDIES:  Dg Chest Port 1 View 07/26/2013   CLINICAL DATA:  Port-A-Cath placement.  EXAM: PORTABLE CHEST - 1 VIEW  COMPARISON:  None.  FINDINGS: Right subclavian Port-A-Cath is present. Catheter tip in the lower SVC region. Evidence for subcutaneous gas along the left hemithorax. There is no evidence for a pneumothorax. Low lung volumes. Prominent lung markings are probably related to the low lung volumes rather than edema. Heart size is within normal limits.  IMPRESSION: Port-A-Cath tip in the lower SVC.  No evidence for a pneumothorax.  Prominent lung markings probably related to technique and low lung volumes. Mild edema cannot be excluded.   Electronically Signed   By: Markus Daft M.D.   On: 07/26/2013 15:36    ASSESSMENT: 58 y.o. [place] woman, status post  left breast biopsy 06/03/2013 for a clinical T2 N0, stage IIA invasive ductal carcinoma, grade 3, estrogen and progesterone receptor positive, with an MIB-1 of 44%, and HER-2 amplified.  (1) status post left lumpectomy and sentinel lymph node sampling 07-2013 for a pT2 pN0, stage IIA invasive ductal carcinoma, grade 3, with negative margins.  (2) being treated in the adjuvant setting, the plan being to treat with 6 q. three-week doses of docetaxel/carboplatin (AUC 5) given along with trastuzumab/pertuzumab, first dose on 09/02/2013. Neulasta on day 2 for granulocyte support.  (3) trastuzumab and pertuzumab to continue to total one year  (4) adjuvant radiation to follow chemotherapy  (5) adjuvant anti-estrogens to follow radiation  (6)  possible early cellulitis in the left breast/left axilla, resolved  (7)  Depression/anxiety - on Celexa, 20 mg   PLAN: Lori Vincent will proceed to treatment today as scheduled for her first dose of adjuvant chemotherapy. I have ordered a low dose of lorazepam, 0.5 mg IV, to be given at the beginning of her treatment today, and this can be repeated x1 if necessary. She has all of her antinausea medications on hand at home, and we again reviewed how to utilize all of them appropriately. She'll continue on the omeprazole as well.  Lori Vincent will return tomorrow for her Neulasta injection on day 2, and I will see her next week on April 17 for assessment of chemotoxicity. We reviewed the above plan together, and she voices both her understanding and agreement . She knows to call with any changes or problems prior to her appointment with me next week.  Theotis Burrow, PA-C   09/02/2013 4:51 PM

## 2013-09-02 NOTE — Patient Instructions (Signed)
[facility] Discharge Instructions for Patients Receiving Chemotherapy  Today you received the following chemotherapy agents Herceptin, Perjeta, Taxotere, Carboplatin.  To help prevent nausea and vomiting after your treatment, we encourage you to take your nausea medication as prescribed.   If you develop nausea and vomiting that is not controlled by your nausea medication, call the clinic.   BELOW ARE SYMPTOMS THAT SHOULD BE REPORTED IMMEDIATELY:  *FEVER GREATER THAN 100.5 F  *CHILLS WITH OR WITHOUT FEVER  NAUSEA AND VOMITING THAT IS NOT CONTROLLED WITH YOUR NAUSEA MEDICATION  *UNUSUAL SHORTNESS OF BREATH  *UNUSUAL BRUISING OR BLEEDING  TENDERNESS IN MOUTH AND THROAT WITH OR WITHOUT PRESENCE OF ULCERS  *URINARY PROBLEMS  *BOWEL PROBLEMS  UNUSUAL RASH Items with * indicate a potential emergency and should be followed up as soon as possible.  Feel free to call the clinic you have any questions or concerns. The clinic phone number is [phone].    

## 2013-09-03 ENCOUNTER — Ambulatory Visit (HOSPITAL_BASED_OUTPATIENT_CLINIC_OR_DEPARTMENT_OTHER): Payer: BC Managed Care – PPO

## 2013-09-03 ENCOUNTER — Telehealth: Payer: Self-pay | Admitting: *Deleted

## 2013-09-03 VITALS — BP 147/77 | HR 69 | Temp 97.9°F | Resp 20

## 2013-09-03 DIAGNOSIS — Z5189 Encounter for other specified aftercare: Secondary | ICD-10-CM

## 2013-09-03 DIAGNOSIS — C50919 Malignant neoplasm of unspecified site of unspecified female breast: Secondary | ICD-10-CM

## 2013-09-03 DIAGNOSIS — C50512 Malignant neoplasm of lower-outer quadrant of left female breast: Secondary | ICD-10-CM

## 2013-09-03 MED ORDER — PEGFILGRASTIM INJECTION 6 MG/0.6ML
6.0000 mg | Freq: Once | SUBCUTANEOUS | Status: AC
  Administered 2013-09-03: 6 mg via SUBCUTANEOUS
  Filled 2013-09-03: qty 0.6

## 2013-09-03 NOTE — Telephone Encounter (Signed)
PT. WAS SEEN BY THE INJECTION NURSE, TAMMI HOLLAND,RN. NO PROBLEMS OR QUESTIONS AT THIS TIME. PT. WILL CALL THIS OFFICE IF THE NEED ARISES. 

## 2013-09-03 NOTE — Patient Instructions (Signed)

## 2013-09-09 ENCOUNTER — Encounter: Payer: Self-pay | Admitting: Oncology

## 2013-09-09 NOTE — Progress Notes (Signed)
Per [employer] the patient's insurance will pay 100%.

## 2013-09-10 ENCOUNTER — Ambulatory Visit (HOSPITAL_BASED_OUTPATIENT_CLINIC_OR_DEPARTMENT_OTHER): Payer: BC Managed Care – PPO | Admitting: Physician Assistant

## 2013-09-10 ENCOUNTER — Telehealth: Payer: Self-pay | Admitting: Oncology

## 2013-09-10 ENCOUNTER — Encounter: Payer: Self-pay | Admitting: Physician Assistant

## 2013-09-10 ENCOUNTER — Other Ambulatory Visit (HOSPITAL_BASED_OUTPATIENT_CLINIC_OR_DEPARTMENT_OTHER): Payer: BC Managed Care – PPO

## 2013-09-10 VITALS — BP 151/97 | HR 82 | Temp 98.5°F | Resp 18 | Ht 68.0 in | Wt 216.8 lb

## 2013-09-10 DIAGNOSIS — R5383 Other fatigue: Secondary | ICD-10-CM

## 2013-09-10 DIAGNOSIS — C50919 Malignant neoplasm of unspecified site of unspecified female breast: Secondary | ICD-10-CM

## 2013-09-10 DIAGNOSIS — F411 Generalized anxiety disorder: Secondary | ICD-10-CM

## 2013-09-10 DIAGNOSIS — F329 Major depressive disorder, single episode, unspecified: Secondary | ICD-10-CM

## 2013-09-10 DIAGNOSIS — C50512 Malignant neoplasm of lower-outer quadrant of left female breast: Secondary | ICD-10-CM

## 2013-09-10 DIAGNOSIS — F419 Anxiety disorder, unspecified: Secondary | ICD-10-CM

## 2013-09-10 DIAGNOSIS — F3289 Other specified depressive episodes: Secondary | ICD-10-CM

## 2013-09-10 DIAGNOSIS — Z170 Estrogen receptor positive status [ER+]: Secondary | ICD-10-CM

## 2013-09-10 LAB — COMPREHENSIVE METABOLIC PANEL (CC13)
ALBUMIN: 3.7 g/dL (ref 3.5–5.0)
ALT: 20 U/L (ref 0–55)
AST: 18 U/L (ref 5–34)
Alkaline Phosphatase: 113 U/L (ref 40–150)
Anion Gap: 9 mEq/L (ref 3–11)
BILIRUBIN TOTAL: 0.29 mg/dL (ref 0.20–1.20)
BUN: 14.9 mg/dL (ref 7.0–26.0)
CALCIUM: 9.1 mg/dL (ref 8.4–10.4)
CHLORIDE: 103 meq/L (ref 98–109)
CO2: 25 meq/L (ref 22–29)
Creatinine: 0.8 mg/dL (ref 0.6–1.1)
GLUCOSE: 122 mg/dL (ref 70–140)
POTASSIUM: 3.5 meq/L (ref 3.5–5.1)
SODIUM: 138 meq/L (ref 136–145)
Total Protein: 6.2 g/dL — ABNORMAL LOW (ref 6.4–8.3)

## 2013-09-10 LAB — CBC WITH DIFFERENTIAL/PLATELET
BASO%: 0.3 % (ref 0.0–2.0)
Basophils Absolute: 0 10*3/uL (ref 0.0–0.1)
EOS ABS: 0 10*3/uL (ref 0.0–0.5)
EOS%: 0 % (ref 0.0–7.0)
HCT: 37.1 % (ref 34.8–46.6)
HGB: 12.1 g/dL (ref 11.6–15.9)
LYMPH#: 3.2 10*3/uL (ref 0.9–3.3)
LYMPH%: 23.3 % (ref 14.0–49.7)
MCH: 27.4 pg (ref 25.1–34.0)
MCHC: 32.6 g/dL (ref 31.5–36.0)
MCV: 83.9 fL (ref 79.5–101.0)
MONO#: 1.5 10*3/uL — AB (ref 0.1–0.9)
MONO%: 10.8 % (ref 0.0–14.0)
NEUT%: 65.6 % (ref 38.4–76.8)
NEUTROS ABS: 9 10*3/uL — AB (ref 1.5–6.5)
PLATELETS: 137 10*3/uL — AB (ref 145–400)
RBC: 4.42 10*6/uL (ref 3.70–5.45)
RDW: 14.2 % (ref 11.2–14.5)
WBC: 13.7 10*3/uL — AB (ref 3.9–10.3)

## 2013-09-10 LAB — MAGNESIUM (CC13): MAGNESIUM: 1.8 mg/dL (ref 1.5–2.5)

## 2013-09-10 MED ORDER — METOCLOPRAMIDE HCL 10 MG PO TABS: ORAL_TABLET | ORAL | Status: DC

## 2013-09-10 NOTE — Telephone Encounter (Signed)
, °

## 2013-09-10 NOTE — Progress Notes (Signed)
[place]  [phone] [phone]     ID: Lori Vincent OB: Mar 24, 1956  MR#: [id number]  VCB#:[id number]  PCP: [name], NP GYN:  [name], MD SU: [name], MD OTHER MD: Arloa Koh, MD  CHIEF COMPLAINT: Left breast cancer/adjuvant chemotherapy   HISTORY OF PRESENT ILLNESS: Lori Vincent [facility] showing a possible mass in the left breast. Diagnostic left mammography and ultrasonography at the breast center 04/26/2013 confirmed an oval mass with microcalcifications in the 6:00 position of the left breast. There was an adjacent partially obscured 1.2 cm mass. On physical exam there was no palpable mass in the left breast. By ultrasonography, there was an oval mass at the 6:00 position measuring 1.4 cm, with a second mass measuring 1.1 cm. There were no abnormal lymph nodes noted in the left axilla.  Biopsy of the 6:00 mass in the left breast January 2015 showed ([id number]) and invasive ductal carcinoma, grade 3, estrogen receptor 97% positive, progesterone receptor 75% positive, with strong staining intensity, and MIB-144%. HER-2 was amplified, with the signals ratio 4.18 and in number per cell of 9.40 .  On 06/08/2013 the patient underwent bilateral breast MRI which showed a dumbbell-shaped area in the central left breast consisting of a 2 cm mass and 2 extensions or an adjacent masses, measuring a total of 2.9 cm. There were no abnormal appearing lymph nodes for any other areas of concern.  The patient's subsequent history is as detailed below.   INTERVAL HISTORY: Lori Vincent returns alone today for followup of her left breast cancer. She is currently day 8 cycle 1 of 6 planned q. three-week doses of docetaxel/carboplatin given along with trastuzumab/pertuzumab given in the adjuvant setting. She receives Neulasta on day 2 for granulocyte support.  Lori Vincent is feeling reasonably well  today, but has had a difficult week. She's had extremely difficult time sleeping and has felt restless. She's had severe restless legs and describes it as feeling like she was "swimming in bed" because she had to move her legs constantly. She felt like her body was "vibrating inside", felt restless, but at the same time was extremely fatigued. I will mention that we had already lowered her dexamethasone dose slightly with this first cycle due to immediate elevation in her blood glucose after taking 8 mg twice daily the day before chemotherapy. (Her glucose has returned to normal, 122 today nonfasting.) She was taking all of her antinausea medications as directed, including the prochlorperazine which she is taking 4 times daily. She actually thinks this may have made the restlessness worse.  She tried taking 0.5 mg of lorazepam at night with only minimal relief.  Otherwise, Lori Vincent tolerated the chemotherapy itself reasonably well. There were no problems during the infusion itself. She's only had a couple of brief episodes of diarrhea, treated effectively with only one dose of Imodium. Her mouth has felt a little sensitive, but she's had no actual ulcerations. She denies any problems with numbness or tingling in the upper or lower extremities.  REVIEW OF SYSTEMS: Lori Vincent denies any fevers or chills, and has had no skin changes or rashes. She denies any signs of abnormal bleeding. Her appetite is fair, and she's had no problems with nausea or emesis. She denies any increased cough, shortness of breath, peripheral swelling, orthopnea, chest pain, or palpitations. She's had no abnormal headaches or dizziness and denies change in vision. Currently, she also denies any unusual myalgias, arthralgias, or bony  pain.  A detailed review of systems is otherwise stable and noncontributory.    PAST MEDICAL HISTORY: Past Medical History  Diagnosis Date  . Breast cancer 06/03/13    left, 6:00 o'clock  . Thyroid disease      hypothyroidism  . Hypothyroidism   . Depression     PAST SURGICAL HISTORY: Past Surgical History  Procedure Laterality Date  . Tonsillectomy  1972  . Cesarean section  04/1999  . Breast biopsy  1/14    lt  . Dilation and curettage of uterus    . Colonoscopy  2013  . Breast lumpectomy with needle localization and axillary sentinel lymph node bx Left 07/26/2013    Procedure: BREAST LUMPECTOMY WITH NEEDLE LOCALIZATION AND AXILLARY SENTINEL LYMPH NODE BX;  Surgeon: Shann Medal, MD;  Location: [facility];  Service: General;  Laterality: Left;  Marland Kitchen Mole removal Right 07/26/2013    Procedure: MOLE REMOVAL UNDER RIGHT BREAST;  Surgeon: Shann Medal, MD;  Location: [facility];  Service: General;  Laterality: Right;  . Portacath placement Right 07/26/2013    Procedure: INSERTION PORT-A-CATH;  Surgeon: Shann Medal, MD;  Location: [facility];  Service: General;  Laterality: Right;    FAMILY HISTORY Family History  Problem Relation Age of Onset  . Stroke Mother   . Diabetes Father   . Cancer Maternal Grandmother 25    colon   the patient's father died at the age of 55 in a car accident. The patient's mother died at the age of 69 from a stroke. The patient has one adopted brother. She has one biological sister. The patient's mother's mother was diagnosed with colon cancer at the age of 52. There is no history of breast oropharynx cancer in the family to the patient's knowledge.  GYNECOLOGIC HISTORY:   (Updated 09/10/2013) Menarche age 62. First live birth age 40. The patient is GX P1. She took birth control pills for approximately 12 years remotely. She had a period in February of 2014 and then in December of 2014. She had been started on hormone replacement in December. That has since been discontinued.  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 Her husband died at [place] 3 years ago with soft tissue sarcoma. The patient's significant other also works as a Geophysicist/field seismologist. At home it is just the patient and her daughter 34, 68 years old. The patient has 2 stepchildren from her earlier marriage. 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  ADVANCED DIRECTIVES: Not in place   HEALTH MAINTENANCE:  (Updated 09/10/2013) History  Substance Use Topics  . Smoking status: Never Smoker   . Smokeless tobacco: Never Used  . Alcohol Use: Yes     Comment: 5-7 glasses per week of red wine     Colonoscopy: July 2014  PAP: November 2014  Bone density: November 2014  Lipid panel: Not on file  Allergies  Allergen Reactions  . No Known Drug Allergy     Current Outpatient Prescriptions  Medication Sig Dispense Refill  . citalopram (CELEXA) 20 MG tablet Take 1 tablet (20 mg total) by mouth daily.  30 tablet  5  . levothyroxine (SYNTHROID, LEVOTHROID) 112 MCG tablet Take 112 mcg by mouth daily before breakfast. Take sat and sunday      . lidocaine-prilocaine (EMLA)  cream Apply 1 application topically as needed. 1-2 hr before each port access  30 g  5  . Multiple Vitamins-Minerals (CENTRUM SILVER PO) Take 1 tablet by mouth daily.      Marland Kitchen omeprazole (PRILOSEC) 40 MG capsule Take 1 capsule (40 mg total) by mouth daily.  30 capsule  4  . SYNTHROID 125 MCG tablet Take 125 mcg by mouth every morning. Monday-Friday-5 days a week      . dexamethasone (DECADRON) 4 MG tablet 2 tabs by mouth with food twice daily on day before and 3 days after each chemo dose  30 tablet  2  . HYDROcodone-acetaminophen (NORCO/VICODIN) 5-325 MG per tablet Take 1-2 tablets by mouth every 6 (six) hours as needed.  30 tablet  0  . LORazepam (ATIVAN) 0.5 MG tablet Take 1 tablet (0.5 mg total) by mouth at bedtime as needed for anxiety or sleep ((or nausea)).  30 tablet  0  . ondansetron (ZOFRAN) 8 MG tablet  1 tab by mouth twice daily x 3 days after chemo, then 1 tab by mouth every 12 hrs if needed for nausea  30 tablet  2  . prochlorperazine (COMPAZINE) 10 MG tablet 1 tab by mouth with meals and bedtime x 3 days after chemo, then 1 tab by mouth every 6 hrs if needed for nausea  30 tablet  2   No current facility-administered medications for this visit.    OBJECTIVE: Middle-aged white woman who appears anxious but is in no acute distress Filed Vitals:   09/10/13 0913  BP: 151/97  Pulse: 82  Temp: 98.5 F (36.9 C)  Resp: 18   Body mass index is 32.97 kg/(m^2).    ECOG FS: 0 Filed Weights   09/10/13 0913  Weight: 216 lb 12.8 oz (98.34 kg)   Physical Exam: HEENT:  Sclerae anicteric. Oropharynx clear and moist. No visible ulcerations or mucositis. No evidence of oropharyngeal candidiasis. No pharyngeal erythema. Neck supple, trachea midline.  NODES:  No cervical or supraclavicular lymphadenopathy palpated.  BREAST EXAM: Deferred. Axillae are benign bilaterally, with no palpable lymphadenopathy.  LUNGS:  Clear to auscultation bilaterally with good excursion.  No wheezes or rhonchi HEART:  Regular rate and rhythm. No murmur appreciated. ABDOMEN:  Soft, nontender.  Positive bowel sounds. No organomegaly or masses. MSK:  No focal spinal tenderness to palpation. Good range of motion bilaterally in the upper extremities. EXTREMITIES:  No peripheral edema.  No lymphedema noted in the left upper extremity. SKIN:  Benign with no visible rashes or skin lesions. No nail dyscrasia. No excessive ecchymoses. No petechiae. No pallor. Port is intact in the right upper chest wall with no erythema, edema, or evidence of infection/cellulitis. NEURO:  Nonfocal. Well oriented. Anxious affect.    LAB RESULTS: CBC    Component Value Date/Time   WBC 13.7* 09/10/2013 0903   RBC 4.42 09/10/2013 0903   HGB 12.1 09/10/2013 0903   HCT 37.1 09/10/2013 0903   PLT 137* 09/10/2013 0903   MCV 83.9 09/10/2013 0903   MCH  27.4 09/10/2013 0903   MCHC 32.6 09/10/2013 0903   RDW 14.2 09/10/2013 0903   LYMPHSABS 3.2 09/10/2013 0903   MONOABS 1.5* 09/10/2013 0903   EOSABS 0.0 09/10/2013 0903   BASOSABS 0.0 09/10/2013 0903    CMP     Component Value Date/Time   NA 138 09/10/2013 0904   K 3.5 09/10/2013 0904   CO2 25 09/10/2013 0904   GLUCOSE 122 09/10/2013 0904   BUN 14.9 09/10/2013  0904   CREATININE 0.8 09/10/2013 0904   CALCIUM 9.1 09/10/2013 0904   PROT 6.2* 09/10/2013 0904   ALBUMIN 3.7 09/10/2013 0904   AST 18 09/10/2013 0904   ALT 20 09/10/2013 0904   ALKPHOS 113 09/10/2013 0904   BILITOT 0.29 09/10/2013 0904      STUDIES:  Dg Chest Port 1 View 07/26/2013   CLINICAL DATA:  Port-A-Cath placement.  EXAM: PORTABLE CHEST - 1 VIEW  COMPARISON:  None.  FINDINGS: Right subclavian Port-A-Cath is present. Catheter tip in the lower SVC region. Evidence for subcutaneous gas along the left hemithorax. There is no evidence for a pneumothorax. Low lung volumes. Prominent lung markings are probably related to the low lung volumes rather than edema. Heart size is within normal limits.  IMPRESSION: Port-A-Cath tip in the lower SVC.  No evidence for a pneumothorax.  Prominent lung markings probably related to technique and low lung volumes. Mild edema cannot be excluded.   Electronically Signed   By: Markus Daft M.D.   On: 07/26/2013 15:36    ASSESSMENT: 58 y.o. [place] woman, status post left breast biopsy 06/03/2013 for a clinical T2 N0, stage IIA invasive ductal carcinoma, grade 3, estrogen and progesterone receptor positive, with an MIB-1 of 44%, and HER-2 amplified.  (1) status post left lumpectomy and sentinel lymph node sampling 07-2013 for a pT2 pN0, stage IIA invasive ductal carcinoma, grade 3, with negative margins.  (2) being treated in the adjuvant setting, the plan being to treat with 6 q. three-week doses of docetaxel/carboplatin (AUC 5) given along with trastuzumab/pertuzumab, first dose on 09/02/2013. Neulasta on day  2 for granulocyte support.  (3) trastuzumab and pertuzumab to continue to total one year  (4) adjuvant radiation to follow chemotherapy  (5) adjuvant anti-estrogens to follow radiation  (6)  possible early cellulitis in the left breast/left axilla, resolved  (7)  Depression/anxiety - on Celexa, 20 mg   PLAN: We are going to make some changes in [place] antinausea regimen beginning with cycle 2. She'll keep taking her reduced dose of dexamethasone as previously discussed. I am also going to give her a prescription for metoclopramide, and I have asked her to substitute the metoclopramide in place of the prochlorperazine. It is likely that that was the culprit in her restlessness.  If she does not feel like the metoclopramide is taking care for nausea, she has the option of trying the prochlorperazine again, but I have suggested that she decrease the dose to 5 mg at that point. I've also recommended that she try taking 1 mg of the lorazepam was first few days following treatment, since she is not taking at on a regular basis otherwise.  Lori Vincent was given all of these instructions in writing today.   Her labs looked great today, and we will repeat them again next week. Otherwise, I will see her in 2 weeks on April 30 in anticipation of her second dose of adjuvant chemotherapy. She voices understanding and agreement with our plan as detailed above. She does know to call if she has any changes or problems prior to her next scheduled appointment.   Theotis Burrow, PA-C   09/10/2013 12:09 PM

## 2013-09-16 ENCOUNTER — Telehealth: Payer: Self-pay | Admitting: *Deleted

## 2013-09-16 ENCOUNTER — Other Ambulatory Visit (HOSPITAL_BASED_OUTPATIENT_CLINIC_OR_DEPARTMENT_OTHER): Payer: BC Managed Care – PPO

## 2013-09-16 DIAGNOSIS — C50919 Malignant neoplasm of unspecified site of unspecified female breast: Secondary | ICD-10-CM

## 2013-09-16 DIAGNOSIS — C50512 Malignant neoplasm of lower-outer quadrant of left female breast: Secondary | ICD-10-CM

## 2013-09-16 LAB — COMPREHENSIVE METABOLIC PANEL (CC13)
ALBUMIN: 3.7 g/dL (ref 3.5–5.0)
ALK PHOS: 104 U/L (ref 40–150)
ALT: 21 U/L (ref 0–55)
AST: 19 U/L (ref 5–34)
Anion Gap: 13 mEq/L — ABNORMAL HIGH (ref 3–11)
BUN: 12.6 mg/dL (ref 7.0–26.0)
CHLORIDE: 106 meq/L (ref 98–109)
CO2: 24 mEq/L (ref 22–29)
Calcium: 9.2 mg/dL (ref 8.4–10.4)
Creatinine: 0.8 mg/dL (ref 0.6–1.1)
GLUCOSE: 132 mg/dL (ref 70–140)
POTASSIUM: 3.3 meq/L — AB (ref 3.5–5.1)
Sodium: 143 mEq/L (ref 136–145)
TOTAL PROTEIN: 6.5 g/dL (ref 6.4–8.3)
Total Bilirubin: 0.36 mg/dL (ref 0.20–1.20)

## 2013-09-16 LAB — CBC WITH DIFFERENTIAL/PLATELET
BASO%: 0.8 % (ref 0.0–2.0)
BASOS ABS: 0 10*3/uL (ref 0.0–0.1)
EOS%: 0.1 % (ref 0.0–7.0)
Eosinophils Absolute: 0 10*3/uL (ref 0.0–0.5)
HEMATOCRIT: 36.2 % (ref 34.8–46.6)
HEMOGLOBIN: 11.9 g/dL (ref 11.6–15.9)
LYMPH#: 1.6 10*3/uL (ref 0.9–3.3)
LYMPH%: 26.4 % (ref 14.0–49.7)
MCH: 27.4 pg (ref 25.1–34.0)
MCHC: 32.9 g/dL (ref 31.5–36.0)
MCV: 83.4 fL (ref 79.5–101.0)
MONO#: 0.4 10*3/uL (ref 0.1–0.9)
MONO%: 6 % (ref 0.0–14.0)
NEUT#: 3.9 10*3/uL (ref 1.5–6.5)
NEUT%: 66.7 % (ref 38.4–76.8)
Platelets: 157 10*3/uL (ref 145–400)
RBC: 4.34 10*6/uL (ref 3.70–5.45)
RDW: 13.9 % (ref 11.2–14.5)
WBC: 5.9 10*3/uL (ref 3.9–10.3)

## 2013-09-16 LAB — MAGNESIUM (CC13): Magnesium: 1.9 mg/dl (ref 1.5–2.5)

## 2013-09-16 MED ORDER — POTASSIUM CHLORIDE CRYS ER 20 MEQ PO TBCR: 20.0000 meq | EXTENDED_RELEASE_TABLET | Freq: Every day | ORAL | Status: DC

## 2013-09-16 NOTE — Telephone Encounter (Signed)
Called pt to inform her of Potassium Level(3.3). No answer, but left a detailed message concerning potassium level and told her I would be calling a Rx for Potassium to her pharmacy per Micah Flesher, PA-C. Instructed pt to take 1- 20 mEq tablet once a day. If pt has any questions told her to call me back at [phone]. Message to be forwarded to [employer], PA-C.

## 2013-09-23 ENCOUNTER — Ambulatory Visit (HOSPITAL_BASED_OUTPATIENT_CLINIC_OR_DEPARTMENT_OTHER): Payer: BC Managed Care – PPO

## 2013-09-23 ENCOUNTER — Other Ambulatory Visit: Payer: BC Managed Care – PPO

## 2013-09-23 ENCOUNTER — Other Ambulatory Visit (HOSPITAL_BASED_OUTPATIENT_CLINIC_OR_DEPARTMENT_OTHER): Payer: BC Managed Care – PPO

## 2013-09-23 ENCOUNTER — Telehealth: Payer: Self-pay | Admitting: *Deleted

## 2013-09-23 ENCOUNTER — Telehealth: Payer: Self-pay | Admitting: Physician Assistant

## 2013-09-23 ENCOUNTER — Ambulatory Visit (HOSPITAL_BASED_OUTPATIENT_CLINIC_OR_DEPARTMENT_OTHER): Payer: BC Managed Care – PPO | Admitting: Physician Assistant

## 2013-09-23 ENCOUNTER — Encounter: Payer: Self-pay | Admitting: Physician Assistant

## 2013-09-23 VITALS — BP 157/73 | HR 77 | Temp 98.6°F | Resp 20 | Ht 68.0 in | Wt 221.3 lb

## 2013-09-23 DIAGNOSIS — R197 Diarrhea, unspecified: Secondary | ICD-10-CM

## 2013-09-23 DIAGNOSIS — E039 Hypothyroidism, unspecified: Secondary | ICD-10-CM

## 2013-09-23 DIAGNOSIS — R350 Frequency of micturition: Secondary | ICD-10-CM

## 2013-09-23 DIAGNOSIS — F411 Generalized anxiety disorder: Secondary | ICD-10-CM

## 2013-09-23 DIAGNOSIS — C50512 Malignant neoplasm of lower-outer quadrant of left female breast: Secondary | ICD-10-CM

## 2013-09-23 DIAGNOSIS — Z5112 Encounter for antineoplastic immunotherapy: Secondary | ICD-10-CM

## 2013-09-23 DIAGNOSIS — C50919 Malignant neoplasm of unspecified site of unspecified female breast: Secondary | ICD-10-CM

## 2013-09-23 DIAGNOSIS — N390 Urinary tract infection, site not specified: Secondary | ICD-10-CM

## 2013-09-23 DIAGNOSIS — Z5111 Encounter for antineoplastic chemotherapy: Secondary | ICD-10-CM

## 2013-09-23 LAB — URINALYSIS, MICROSCOPIC - CHCC
BILIRUBIN (URINE): NEGATIVE
GLUCOSE UR CHCC: NEGATIVE mg/dL
Ketones: NEGATIVE mg/dL
Leukocyte Esterase: NEGATIVE
NITRITE: NEGATIVE
PH: 7 (ref 4.6–8.0)
Protein: NEGATIVE mg/dL
RBC / HPF: NEGATIVE (ref 0–2)
SPECIFIC GRAVITY, URINE: 1.015 (ref 1.003–1.035)
UROBILINOGEN UR: 0.2 mg/dL (ref 0.2–1)
WBC, UA: NEGATIVE (ref 0–2)

## 2013-09-23 LAB — CBC WITH DIFFERENTIAL/PLATELET
BASO%: 0.1 % (ref 0.0–2.0)
Basophils Absolute: 0 10*3/uL (ref 0.0–0.1)
EOS%: 0.1 % (ref 0.0–7.0)
Eosinophils Absolute: 0 10*3/uL (ref 0.0–0.5)
HCT: 33.8 % — ABNORMAL LOW (ref 34.8–46.6)
HGB: 11.2 g/dL — ABNORMAL LOW (ref 11.6–15.9)
LYMPH%: 10.1 % — ABNORMAL LOW (ref 14.0–49.7)
MCH: 27.7 pg (ref 25.1–34.0)
MCHC: 33.2 g/dL (ref 31.5–36.0)
MCV: 83.6 fL (ref 79.5–101.0)
MONO#: 0.4 10*3/uL (ref 0.1–0.9)
MONO%: 5.1 % (ref 0.0–14.0)
NEUT#: 7.3 10*3/uL — ABNORMAL HIGH (ref 1.5–6.5)
NEUT%: 84.6 % — ABNORMAL HIGH (ref 38.4–76.8)
Platelets: 247 10*3/uL (ref 145–400)
RBC: 4.05 10*6/uL (ref 3.70–5.45)
RDW: 14.5 % (ref 11.2–14.5)
WBC: 8.6 10*3/uL (ref 3.9–10.3)
lymph#: 0.9 10*3/uL (ref 0.9–3.3)

## 2013-09-23 LAB — COMPREHENSIVE METABOLIC PANEL (CC13)
ALBUMIN: 3.9 g/dL (ref 3.5–5.0)
ALK PHOS: 99 U/L (ref 40–150)
ALT: 23 U/L (ref 0–55)
AST: 17 U/L (ref 5–34)
Anion Gap: 11 mEq/L (ref 3–11)
BUN: 12.7 mg/dL (ref 7.0–26.0)
CO2: 21 mEq/L — ABNORMAL LOW (ref 22–29)
Calcium: 9.4 mg/dL (ref 8.4–10.4)
Chloride: 108 mEq/L (ref 98–109)
Creatinine: 0.8 mg/dL (ref 0.6–1.1)
Glucose: 176 mg/dl — ABNORMAL HIGH (ref 70–140)
Potassium: 3.5 mEq/L (ref 3.5–5.1)
Sodium: 139 mEq/L (ref 136–145)
Total Bilirubin: 0.46 mg/dL (ref 0.20–1.20)
Total Protein: 6.7 g/dL (ref 6.4–8.3)

## 2013-09-23 LAB — TSH CHCC: TSH: 0.489 m(IU)/L (ref 0.308–3.960)

## 2013-09-23 LAB — MAGNESIUM (CC13): Magnesium: 2.2 mg/dl (ref 1.5–2.5)

## 2013-09-23 MED ORDER — DEXAMETHASONE SODIUM PHOSPHATE 20 MG/5ML IJ SOLN
INTRAMUSCULAR | Status: AC
  Filled 2013-09-23: qty 5

## 2013-09-23 MED ORDER — DIPHENHYDRAMINE HCL 25 MG PO CAPS
ORAL_CAPSULE | ORAL | Status: AC
  Filled 2013-09-23: qty 2

## 2013-09-23 MED ORDER — CHOLESTYRAMINE LIGHT 4 G PO PACK: 4.0000 g | PACK | Freq: Two times a day (BID) | ORAL | Status: DC | PRN

## 2013-09-23 MED ORDER — ONDANSETRON 16 MG/50ML IVPB (CHCC)
INTRAVENOUS | Status: AC
  Filled 2013-09-23: qty 16

## 2013-09-23 MED ORDER — SODIUM CHLORIDE 0.9 % IV SOLN
420.0000 mg | Freq: Once | INTRAVENOUS | Status: AC
  Administered 2013-09-23: 420 mg via INTRAVENOUS
  Filled 2013-09-23: qty 14

## 2013-09-23 MED ORDER — SODIUM CHLORIDE 0.9 % IV SOLN
Freq: Once | INTRAVENOUS | Status: AC
  Administered 2013-09-23: 11:00:00 via INTRAVENOUS

## 2013-09-23 MED ORDER — HEPARIN SOD (PORK) LOCK FLUSH 100 UNIT/ML IV SOLN
500.0000 [IU] | Freq: Once | INTRAVENOUS | Status: AC | PRN
  Administered 2013-09-23: 500 [IU]
  Filled 2013-09-23: qty 5

## 2013-09-23 MED ORDER — ACETAMINOPHEN 325 MG PO TABS
650.0000 mg | ORAL_TABLET | Freq: Once | ORAL | Status: AC
  Administered 2013-09-23: 650 mg via ORAL

## 2013-09-23 MED ORDER — TRASTUZUMAB CHEMO INJECTION 440 MG
6.0000 mg/kg | Freq: Once | INTRAVENOUS | Status: AC
  Administered 2013-09-23: 588 mg via INTRAVENOUS
  Filled 2013-09-23: qty 28

## 2013-09-23 MED ORDER — ACETAMINOPHEN 325 MG PO TABS
ORAL_TABLET | ORAL | Status: AC
  Filled 2013-09-23: qty 2

## 2013-09-23 MED ORDER — PROMETHAZINE HCL 25 MG PO TABS: 25.0000 mg | ORAL_TABLET | Freq: Four times a day (QID) | ORAL | Status: DC | PRN

## 2013-09-23 MED ORDER — ONDANSETRON 16 MG/50ML IVPB (CHCC)
16.0000 mg | Freq: Once | INTRAVENOUS | Status: AC
  Administered 2013-09-23: 16 mg via INTRAVENOUS

## 2013-09-23 MED ORDER — DIPHENHYDRAMINE HCL 25 MG PO CAPS
50.0000 mg | ORAL_CAPSULE | Freq: Once | ORAL | Status: AC
  Administered 2013-09-23: 50 mg via ORAL

## 2013-09-23 MED ORDER — DOCETAXEL CHEMO INJECTION 160 MG/16ML
75.0000 mg/m2 | Freq: Once | INTRAVENOUS | Status: AC
  Administered 2013-09-23: 170 mg via INTRAVENOUS
  Filled 2013-09-23: qty 17

## 2013-09-23 MED ORDER — DEXAMETHASONE SODIUM PHOSPHATE 20 MG/5ML IJ SOLN
20.0000 mg | Freq: Once | INTRAMUSCULAR | Status: AC
  Administered 2013-09-23: 20 mg via INTRAVENOUS

## 2013-09-23 MED ORDER — SODIUM CHLORIDE 0.9 % IJ SOLN
10.0000 mL | INTRAMUSCULAR | Status: DC | PRN
  Administered 2013-09-23: 10 mL
  Filled 2013-09-23: qty 10

## 2013-09-23 MED ORDER — SODIUM CHLORIDE 0.9 % IV SOLN
750.0000 mg | Freq: Once | INTRAVENOUS | Status: AC
  Administered 2013-09-23: 750 mg via INTRAVENOUS
  Filled 2013-09-23: qty 75

## 2013-09-23 NOTE — Patient Instructions (Signed)
[facility] Discharge Instructions for Patients Receiving Chemotherapy  Today you received the following chemotherapy agents :  Herceptin, Perjeta, Taxotere, Carboplatin.  To help prevent nausea and vomiting after your treatment, we encourage you to take your nausea medication as instructed by Dr. Jana Hakim and Micah Flesher, [place].   If you develop nausea and vomiting that is not controlled by your nausea medication, call the clinic.   BELOW ARE SYMPTOMS THAT SHOULD BE REPORTED IMMEDIATELY:  *FEVER GREATER THAN 100.5 F  *CHILLS WITH OR WITHOUT FEVER  NAUSEA AND VOMITING THAT IS NOT CONTROLLED WITH YOUR NAUSEA MEDICATION  *UNUSUAL SHORTNESS OF BREATH  *UNUSUAL BRUISING OR BLEEDING  TENDERNESS IN MOUTH AND THROAT WITH OR WITHOUT PRESENCE OF ULCERS  *URINARY PROBLEMS  *BOWEL PROBLEMS  UNUSUAL RASH Items with * indicate a potential emergency and should be followed up as soon as possible.  Feel free to call the clinic you have any questions or concerns. The clinic phone number is [phone].

## 2013-09-23 NOTE — Progress Notes (Signed)
Lori Vincent  [phone] [phone]     ID: [name] OB: [birth year]  MR#: [id number]  XLK#:[id number]  PCP: [name], NP GYN:  [name], MD SU: [name], MD OTHER MD: Arloa Koh, MD  CHIEF COMPLAINT: Left breast cancer/adjuvant chemotherapy   HISTORY OF PRESENT ILLNESS: Lori Vincent [facility] showing a possible mass in Lori left breast. Diagnostic left mammography and [facility] 04[facility]26/2013 confirmed an oval mass with microcalcifications in Lori 6:00 position of Lori left breast. There was an adjacent partially obscured 1.2 cm mass. On physical exam there was no palpable mass in Lori left breast. By ultrasonography, there was an oval mass at Lori 6:00 position measuring 1.4 cm, with a second mass measuring 1.1 cm. There were no abnormal lymph nodes noted in Lori left axilla.  Biopsy of Lori 6:00 mass in Lori left breast January 2015 showed ([id number]) and invasive ductal carcinoma, grade 3, estrogen receptor 97% positive, progesterone receptor 75% positive, with strong staining intensity, and MIB-144%. HER-2 was amplified, with Lori signals ratio 4.18 and in number per cell of 9.40 .  On 06/08/2013 Lori Vincent underwent bilateral breast MRI which showed a dumbbell-shaped area in Lori central left breast consisting of a 2 cm mass and 2 extensions or an adjacent masses, measuring a total of 2.9 cm. There were no abnormal appearing lymph nodes for any other areas of concern.  Lori Vincent's subsequent history is as detailed below.   INTERVAL HISTORY: Lori Vincent returns alone today for followup of her left breast cancer. She is due for day 1 cycle 2 of 6 planned q. three-week doses of docetaxel/carboplatin given along with trastuzumab/pertuzumab given in Lori adjuvant setting. She receives Neulasta on day 2 for granulocyte support.  Lori Vincent has recovered well from her first  cycle of adjuvant chemotherapy. Following her appointment here 2 weeks ago, she did develop some diarrhea which lasted for approximately 5 days. She was able to treat this effectively with Imodium. Lori diarrhea has now resolved. She is trying to keep herself well hydrated. She's also had some increased urinary frequency but denies any dysuria or hematuria.    REVIEW OF SYSTEMS: Lori Vincent denies any fevers, night sweats or chills. She does have some occasional hot flashes. She occasionally has some difficulty sleeping. Her energy level is fairly good. She's eating and drinking well, currently with no nausea. Her bowels are currently normal, with neither diarrhea nor constipation. She denies any increased cough, phlegm production, shortness of breath, chest pain, or palpitations. She's had no orthopnea and denies any peripheral swelling. She's had no abnormal headaches or dizziness. She denies any change in vision. She does have some joint pain associated with arthritis. She denies any additional or unusual myalgias, arthralgias, or bony pain. She also denies any signs of peripheral neuropathy in either Lori upper or lower extremities.  A detailed review of systems is otherwise stable and noncontributory.    PAST MEDICAL HISTORY: Past Medical History  Diagnosis Date  . Breast cancer 06/03/13    left, 6:00 o'clock  . Thyroid disease     hypothyroidism  . Hypothyroidism   . Depression     PAST SURGICAL HISTORY: Past Surgical History  Procedure Laterality Date  . Tonsillectomy  1972  . Cesarean section  04/1999  . Breast biopsy  1/14    lt  . Dilation and curettage of uterus    . Colonoscopy  2013  .  Breast lumpectomy with needle localization and axillary sentinel lymph node bx Left 07/26/2013    Procedure: BREAST LUMPECTOMY WITH NEEDLE LOCALIZATION AND AXILLARY SENTINEL LYMPH NODE BX;  Surgeon: Shann Medal, MD;  Location: [facility];  Service: General;  Laterality: Left;  Marland Kitchen Mole  removal Right 07/26/2013    Procedure: MOLE REMOVAL UNDER RIGHT BREAST;  Surgeon: Shann Medal, MD;  Location: [facility];  Service: General;  Laterality: Right;  . Portacath placement Right 07/26/2013    Procedure: INSERTION PORT-A-CATH;  Surgeon: Shann Medal, MD;  Location: [facility];  Service: General;  Laterality: Right;    FAMILY HISTORY Family History  Problem Relation Age of Onset  . Stroke Mother   . Diabetes Father   . Cancer Maternal Grandmother 9    colon   Lori Vincent's father died at Lori age of 58 in a car accident. Lori Vincent's mother died at Lori age of 90 from a stroke. Lori Vincent has one adopted brother. She has one biological sister. Lori Vincent's mother's mother was diagnosed with colon cancer at Lori age of 79. There is no history of breast oropharynx cancer in Lori family to Lori Vincent's knowledge.  GYNECOLOGIC HISTORY:   (Reviewed 09/23/2013) Menarche age 19. First live birth age 80. Lori Vincent is GX P1. She took birth control pills for approximately 12 years remotely. She had a period in February of 2014 and then in December of 2014. She had been started on hormone replacement in December. That has since been discontinued.  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 Her husband died at [place] 3 years ago with soft tissue sarcoma. Lori Vincent's significant other also works as a Geophysicist/field seismologist. At home it is just Lori Vincent and her daughter 50, 61 years old. Lori Vincent has 2 stepchildren from her earlier marriage. 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  ADVANCED DIRECTIVES: Not in place   HEALTH MAINTENANCE:  (Updated 09/23/2013) History  Substance Use Topics  . Smoking status: Never Smoker   . Smokeless tobacco: Never Used  . Alcohol Use: Yes     Comment:  5-7 glasses per week of red wine     Colonoscopy: July 2014  PAP: November 2014  Bone density: November 2014  Lipid panel: Not on file  Allergies  Allergen Reactions  . No Known Drug Allergy     Current Outpatient Prescriptions  Medication Sig Dispense Refill  . citalopram (CELEXA) 20 MG tablet Take 1 tablet (20 mg total) by mouth daily.  30 tablet  5  . dexamethasone (DECADRON) 4 MG tablet 2 tabs by mouth with food twice daily on day before and 3 days after each chemo dose  30 tablet  2  . levothyroxine (SYNTHROID, LEVOTHROID) 112 MCG tablet Take 112 mcg by mouth daily before breakfast. Take sat and sunday      . lidocaine-prilocaine (EMLA) cream Apply 1 application topically as needed. 1-2 hr before each port access  30 g  5  . LORazepam (ATIVAN) 0.5 MG tablet Take 1 tablet (0.5 mg total) by mouth at bedtime as needed for anxiety or sleep ((or nausea)).  30 tablet  0  . metoCLOPramide (REGLAN) 10 MG tablet 1 tab by mouth with meals and bedtime x 3 days after each chemo, then 1 tab  by mouth every 6 hrs if needed for nausea  30 tablet  2  . Multiple Vitamins-Minerals (CENTRUM SILVER PO) Take 1 tablet by mouth daily.      Marland Kitchen omeprazole (PRILOSEC) 40 MG capsule Take 1 capsule (40 mg total) by mouth daily.  30 capsule  4  . potassium chloride SA (K-DUR,KLOR-CON) 20 MEQ tablet Take 1 tablet (20 mEq total) by mouth daily.  30 tablet  2  . SYNTHROID 125 MCG tablet Take 125 mcg by mouth every morning. Monday-Friday-5 days a week      . cholestyramine light (PREVALITE) 4 G packet Take 1 packet (4 g total) by mouth 2 (two) times daily as needed (diarrhea).  42 packet  1  . HYDROcodone-acetaminophen (NORCO/VICODIN) 5-325 MG per tablet Take 1-2 tablets by mouth every 6 (six) hours as needed.  30 tablet  0  . ondansetron (ZOFRAN) 8 MG tablet 1 tab by mouth twice daily x 3 days after chemo, then 1 tab by mouth every 12 hrs if needed for nausea  30 tablet  2  . prochlorperazine (COMPAZINE) 10 MG tablet  1 tab by mouth with meals and bedtime x 3 days after chemo, then 1 tab by mouth every 6 hrs if needed for nausea  30 tablet  2  . promethazine (PHENERGAN) 25 MG tablet Take 1 tablet (25 mg total) by mouth every 6 (six) hours as needed for nausea or vomiting.  30 tablet  1   No current facility-administered medications for this visit.    OBJECTIVE: Middle-aged white woman  in no acute distress Filed Vitals:   09/23/13 0944  BP: 157/73  Pulse: 77  Temp: 98.6 F (37 C)  Resp: 20   Body mass index is 33.66 kg/(m^2).    ECOG FS: 0 Filed Weights   09/23/13 0944  Weight: 221 lb 4.8 oz (100.381 kg)   Physical Exam: HEENT:  Sclerae anicteric. Oropharynx clear, pink, and moist. Slight angular cheilitis noted bilaterally  No visible ulcerations or mucositis. No evidence of oropharyngeal candidiasis.  Neck supple, trachea midline.  NODES:  No cervical or supraclavicular lymphadenopathy palpated.  BREAST EXAM: Deferred. Axillae are benign bilaterally, with no palpable lymphadenopathy.  LUNGS:  Clear to auscultation bilaterally with good excursion.  No wheezes or rhonchi HEART:  Regular rate and rhythm. No murmur appreciated. ABDOMEN:  Soft, nontender.  Positive,  normoactive bowel sounds. No organomegaly or masses Palpated. MSK:  No focal spinal tenderness to palpation. Good range of motion bilaterally in Lori upper extremities. EXTREMITIES:  No peripheral edema.  No lymphedema noted in Lori left upper extremity. SKIN:  No rashes. No nail dyscrasia. No excessive ecchymoses. No petechiae. No pallor. Port is intact in Lori right upper chest wall with no erythema, edema, or evidence of infection/cellulitis. NEURO:  Nonfocal. Well oriented.  Appropriate  affect.    LAB RESULTS: CBC    Component Value Date/Time   WBC 8.6 09/23/2013 0935   RBC 4.05 09/23/2013 0935   HGB 11.2* 09/23/2013 0935   HCT 33.8* 09/23/2013 0935   PLT 247 09/23/2013 0935   MCV 83.6 09/23/2013 0935   MCH 27.7 09/23/2013 0935    MCHC 33.2 09/23/2013 0935   RDW 14.5 09/23/2013 0935   LYMPHSABS 0.9 09/23/2013 0935   MONOABS 0.4 09/23/2013 0935   EOSABS 0.0 09/23/2013 0935   BASOSABS 0.0 09/23/2013 0935    CMP     Component Value Date/Time   NA 139 09/23/2013 0935   K 3.5 09/23/2013 0935  CO2 21* 09/23/2013 0935   GLUCOSE 176* 09/23/2013 0935   BUN 12.7 09/23/2013 0935   CREATININE 0.8 09/23/2013 0935   CALCIUM 9.4 09/23/2013 0935   PROT 6.7 09/23/2013 0935   ALBUMIN 3.9 09/23/2013 0935   AST 17 09/23/2013 0935   ALT 23 09/23/2013 0935   ALKPHOS 99 09/23/2013 0935   BILITOT 0.46 09/23/2013 0935      STUDIES:  Dg Chest Port 1 View 07/26/2013   CLINICAL DATA:  Port-A-Cath placement.  EXAM: PORTABLE CHEST - 1 VIEW  COMPARISON:  None.  FINDINGS: Right subclavian Port-A-Cath is present. Catheter tip in Lori lower SVC region. Evidence for subcutaneous gas along Lori left hemithorax. There is no evidence for a pneumothorax. Low lung volumes. Prominent lung markings are probably related to Lori low lung volumes rather than edema. Heart size is within normal limits.  IMPRESSION: Port-A-Cath tip in Lori lower SVC.  No evidence for a pneumothorax.  Prominent lung markings probably related to technique and low lung volumes. Mild edema cannot be excluded.   Electronically Signed   By: Markus Daft M.D.   On: 07/26/2013 15:36    ASSESSMENT: 59 y.o. [place] woman, status post left breast biopsy 06/03/2013 for a clinical T2 N0, stage IIA invasive ductal carcinoma, grade 3, estrogen and progesterone receptor positive, with an MIB-1 of 44%, and HER-2 amplified.  (1) status post left lumpectomy and sentinel lymph node sampling 07-2013 for a pT2 pN0, stage IIA invasive ductal carcinoma, grade 3, with negative margins.  (2) being treated in Lori adjuvant setting, Lori plan being to treat with 6 q. three-week doses of docetaxel/carboplatin (AUC 5) given along with trastuzumab/pertuzumab, first dose on 09/02/2013. Neulasta on day 2 for granulocyte  support.  (3) trastuzumab and pertuzumab to continue to total one year  (4) adjuvant radiation to follow chemotherapy  (5) adjuvant anti-estrogens to follow radiation  (6)  Depression/anxiety - on Celexa, 20 mg   PLAN: Shalena will proceed to treatment today as scheduled for her second adjuvant dose of docetaxel/carboplatin given with trastuzumab/pertuzumab.  We are going to obtain a urinalysis and culture today to evaluate for possible urinary tract infection in light of her increased urinary frequency. I have also given her prescriptions today for Phenergan and Questran. She is a little anxious about taking either Lori Compazine or Reglan because of Lori "restlessness" that she experienced with cycle 1. She does feel that this was directly related to Lori antinausea medications, not Lori steroids. She understands that she can alternate between Lori Phenergan, Compazine, or Reglan, depending on which medication works better for her. She does understand not to take these medications together.   Rakia will continue using Imodium as needed should her diarrhea returned. I've also given her prescription for Questran with instructions on how to use that if Lori Imodium does not take care of Lori diarrhea affectively. If necessary, she can maintain regular bowel movements but utilizing Lori Questran once daily.   Cookie will return tomorrow for her Neulasta injection, and will see Dr. Jana Hakim next week for assessment of chemotoxicity. I will see her in 3 weeks on May 21 in anticipation of her third adjuvant cycle of chemotherapy. All of Lori above was reviewed in detail with [name] today. She voices her understanding and agreement with our plan, and will call with any changes or problems.   Theotis Burrow, PA-C   09/23/2013 10:23 AM

## 2013-09-23 NOTE — Telephone Encounter (Signed)
Called pt. No answer, but left a detailed message concerning lab report. Communicated to pt that urinalysis showed no evidence of an UTI, but sending for a culture nevertheless to make sure, also informed pt that thyroid functions are normal. Told pt if she has any questions  to call this nurse @ [phone]. Message to be forwarded to [employer], PA-C.

## 2013-09-23 NOTE — Telephone Encounter (Signed)
, °

## 2013-09-24 ENCOUNTER — Ambulatory Visit (HOSPITAL_BASED_OUTPATIENT_CLINIC_OR_DEPARTMENT_OTHER): Payer: BC Managed Care – PPO

## 2013-09-24 VITALS — BP 153/90 | HR 74 | Temp 98.4°F

## 2013-09-24 DIAGNOSIS — C50512 Malignant neoplasm of lower-outer quadrant of left female breast: Secondary | ICD-10-CM

## 2013-09-24 DIAGNOSIS — Z5189 Encounter for other specified aftercare: Secondary | ICD-10-CM

## 2013-09-24 DIAGNOSIS — C50919 Malignant neoplasm of unspecified site of unspecified female breast: Secondary | ICD-10-CM

## 2013-09-24 LAB — URINE CULTURE

## 2013-09-24 MED ORDER — PEGFILGRASTIM INJECTION 6 MG/0.6ML
6.0000 mg | Freq: Once | SUBCUTANEOUS | Status: AC
  Administered 2013-09-24: 6 mg via SUBCUTANEOUS
  Filled 2013-09-24: qty 0.6

## 2013-09-27 ENCOUNTER — Encounter: Payer: Self-pay | Admitting: Oncology

## 2013-09-30 ENCOUNTER — Other Ambulatory Visit: Payer: BC Managed Care – PPO

## 2013-09-30 ENCOUNTER — Ambulatory Visit: Payer: BC Managed Care – PPO

## 2013-09-30 ENCOUNTER — Other Ambulatory Visit (HOSPITAL_BASED_OUTPATIENT_CLINIC_OR_DEPARTMENT_OTHER): Payer: BC Managed Care – PPO

## 2013-09-30 ENCOUNTER — Ambulatory Visit (HOSPITAL_BASED_OUTPATIENT_CLINIC_OR_DEPARTMENT_OTHER): Payer: BC Managed Care – PPO | Admitting: Oncology

## 2013-09-30 VITALS — BP 129/81 | HR 116 | Temp 98.3°F | Resp 20 | Ht 68.0 in | Wt 214.8 lb

## 2013-09-30 DIAGNOSIS — C50919 Malignant neoplasm of unspecified site of unspecified female breast: Secondary | ICD-10-CM

## 2013-09-30 DIAGNOSIS — C50512 Malignant neoplasm of lower-outer quadrant of left female breast: Secondary | ICD-10-CM

## 2013-09-30 DIAGNOSIS — R5381 Other malaise: Secondary | ICD-10-CM

## 2013-09-30 DIAGNOSIS — K219 Gastro-esophageal reflux disease without esophagitis: Secondary | ICD-10-CM

## 2013-09-30 DIAGNOSIS — R21 Rash and other nonspecific skin eruption: Secondary | ICD-10-CM

## 2013-09-30 DIAGNOSIS — R5383 Other fatigue: Secondary | ICD-10-CM

## 2013-09-30 DIAGNOSIS — Z170 Estrogen receptor positive status [ER+]: Secondary | ICD-10-CM

## 2013-09-30 DIAGNOSIS — F341 Dysthymic disorder: Secondary | ICD-10-CM

## 2013-09-30 DIAGNOSIS — R197 Diarrhea, unspecified: Secondary | ICD-10-CM

## 2013-09-30 LAB — CBC WITH DIFFERENTIAL/PLATELET
BASO%: 0.3 % (ref 0.0–2.0)
Basophils Absolute: 0 10*3/uL (ref 0.0–0.1)
EOS%: 0.1 % (ref 0.0–7.0)
Eosinophils Absolute: 0 10*3/uL (ref 0.0–0.5)
HCT: 38.8 % (ref 34.8–46.6)
HGB: 12.9 g/dL (ref 11.6–15.9)
LYMPH%: 31.1 % (ref 14.0–49.7)
MCH: 27.9 pg (ref 25.1–34.0)
MCHC: 33.2 g/dL (ref 31.5–36.0)
MCV: 83.8 fL (ref 79.5–101.0)
MONO#: 1.5 10*3/uL — ABNORMAL HIGH (ref 0.1–0.9)
MONO%: 15.9 % — ABNORMAL HIGH (ref 0.0–14.0)
NEUT#: 5 10*3/uL (ref 1.5–6.5)
NEUT%: 52.6 % (ref 38.4–76.8)
Platelets: 185 10*3/uL (ref 145–400)
RBC: 4.63 10*6/uL (ref 3.70–5.45)
RDW: 15.3 % — ABNORMAL HIGH (ref 11.2–14.5)
WBC: 9.6 10*3/uL (ref 3.9–10.3)
lymph#: 3 10*3/uL (ref 0.9–3.3)
nRBC: 0 % (ref 0–0)

## 2013-09-30 LAB — MAGNESIUM (CC13): Magnesium: 2.3 mg/dl (ref 1.5–2.5)

## 2013-09-30 LAB — COMPREHENSIVE METABOLIC PANEL (CC13)
ALT: 19 U/L (ref 0–55)
AST: 18 U/L (ref 5–34)
Albumin: 3.8 g/dL (ref 3.5–5.0)
Alkaline Phosphatase: 109 U/L (ref 40–150)
Anion Gap: 12 mEq/L — ABNORMAL HIGH (ref 3–11)
BUN: 12 mg/dL (ref 7.0–26.0)
CO2: 23 mEq/L (ref 22–29)
Calcium: 9.7 mg/dL (ref 8.4–10.4)
Chloride: 103 mEq/L (ref 98–109)
Creatinine: 0.9 mg/dL (ref 0.6–1.1)
Glucose: 133 mg/dl (ref 70–140)
Potassium: 3.5 mEq/L (ref 3.5–5.1)
Sodium: 138 mEq/L (ref 136–145)
Total Bilirubin: 0.44 mg/dL (ref 0.20–1.20)
Total Protein: 6.6 g/dL (ref 6.4–8.3)

## 2013-09-30 NOTE — Progress Notes (Signed)
[place]  [phone] Fax:([phone]) [phone]     ID: Lori Vincent OB: 27-Sep-1955  MR#: [id number]  RCB#:[id number]  PCP: [name], NP GYN:  [name], MD SU: [name], MD OTHER MD: Arloa Koh, MD  CHIEF COMPLAINT: Left breast cancer/adjuvant chemotherapy   HISTORY OF PRESENT ILLNESS: Lori Vincent [facility] showing a possible mass in the left breast. Diagnostic left mammography and ultrasonography at the breast center 04/26/2013 confirmed an oval mass with microcalcifications in the 6:00 position of the left breast. There was an adjacent partially obscured 1.2 cm mass. On physical exam there was no palpable mass in the left breast. By ultrasonography, there was an oval mass at the 6:00 position measuring 1.4 cm, with a second mass measuring 1.1 cm. There were no abnormal lymph nodes noted in the left axilla.  Biopsy of the 6:00 mass in the left breast January 2015 showed ([id number]) and invasive ductal carcinoma, grade 3, estrogen receptor 97% positive, progesterone receptor 75% positive, with strong staining intensity, and MIB-144%. HER-2 was amplified, with the signals ratio 4.18 and in number per cell of 9.40 .  On 06/08/2013 the patient underwent bilateral breast MRI which showed a dumbbell-shaped area in the central left breast consisting of a 2 cm mass and 2 extensions or an adjacent masses, measuring a total of 2.9 cm. There were no abnormal appearing lymph nodes for any other areas of concern.  The patient's subsequent history is as detailed below.   INTERVAL HISTORY: Lori Vincent returns today for followup of her left breast cancer. Today is day 8 cycle 2 of 6 planned q. three-week doses of docetaxel/carboplatin given along with trastuzumab/pertuzumab in the adjuvant setting. She receives Neulasta on day 2 for granulocyte support.  REVIEW OF SYSTEMS: Lori Vincent tells me this second cycle was  better than the first but she still "doesn't like it". The Compazine was making her very jittery and nervous and stopping that made a big difference. She is sleeping better. Her nausea remains well controlled. Currently the biggest problem she is as is when she stops the dexamethasone after day 4, the next several days she feels extremely fatigued. She also has developed a rash over her hands which is not itchy or painful. He tells me she has been using a lot of alcohol to clean her hands all the time. She doesn't have a rash anywhere else except the hands. She's had minimal mouth sores, which are pretty much resolved. Her diarrhea is well controlled on Questran and Imodium. Otherwise a detailed review of systems today was noncontributory   PAST MEDICAL HISTORY: Past Medical History  Diagnosis Date  . Breast cancer 06/03/13    left, 6:00 o'clock  . Thyroid disease     hypothyroidism  . Hypothyroidism   . Depression     PAST SURGICAL HISTORY: Past Surgical History  Procedure Laterality Date  . Tonsillectomy  1972  . Cesarean section  04/1999  . Breast biopsy  1/14    lt  . Dilation and curettage of uterus    . Colonoscopy  2013  . Breast lumpectomy with needle localization and axillary sentinel lymph node bx Left 07/26/2013    Procedure: BREAST LUMPECTOMY WITH NEEDLE LOCALIZATION AND AXILLARY SENTINEL LYMPH NODE BX;  Surgeon: Shann Medal, MD;  Location: [facility];  Service: General;  Laterality: Left;  Marland Kitchen Mole removal Right 07/26/2013    Procedure: MOLE REMOVAL UNDER RIGHT BREAST;  Surgeon: Shann Medal, MD;  Location: [facility];  Service: General;  Laterality: Right;  . Portacath placement Right 07/26/2013    Procedure: INSERTION PORT-A-CATH;  Surgeon: Shann Medal, MD;  Location: [facility];  Service: General;  Laterality: Right;    FAMILY HISTORY Family History  Problem Relation Age of Onset  . Stroke Mother   . Diabetes Father   .  Cancer Maternal Grandmother 46    colon   the patient's father died at the age of 57 in a car accident. The patient's mother died at the age of 26 from a stroke. The patient has one adopted brother. She has one biological sister. The patient's mother's mother was diagnosed with colon cancer at the age of 96. There is no history of breast oropharynx cancer in the family to the patient's knowledge.  GYNECOLOGIC HISTORY:   (Reviewed 09/23/2013) Menarche age 73. First live birth age 42. The patient is GX P1. She took birth control pills for approximately 12 years remotely. She had a period in February of 2014 and then in December of 2014. She had been started on hormone replacement in December. That has since been discontinued.  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 Her husband died at [place] 3 years ago with soft tissue sarcoma. The patient's significant other also works as a Geophysicist/field seismologist. At home it is just the patient and her daughter 66, 35 years old. The patient has 2 stepchildren from her earlier marriage. 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  ADVANCED DIRECTIVES: Not in place   HEALTH MAINTENANCE:  (Updated 09/23/2013) History  Substance Use Topics  . Smoking status: Never Smoker   . Smokeless tobacco: Never Used  . Alcohol Use: Yes     Comment: 5-7 glasses per week of red wine     Colonoscopy: July 2014  PAP: November 2014  Bone density: November 2014  Lipid panel: Not on file  Allergies  Allergen Reactions  . No Known Drug Allergy     Current Outpatient Prescriptions  Medication Sig Dispense Refill  . cholestyramine light (PREVALITE) 4 G packet Take 1 packet (4 g total) by mouth 2 (two) times daily as needed (diarrhea).  42 packet  1  . citalopram (CELEXA) 20 MG tablet Take 1 tablet (20 mg  total) by mouth daily.  30 tablet  5  . dexamethasone (DECADRON) 4 MG tablet 2 tabs by mouth with food twice daily on day before and 3 days after each chemo dose  30 tablet  2  . HYDROcodone-acetaminophen (NORCO/VICODIN) 5-325 MG per tablet Take 1-2 tablets by mouth every 6 (six) hours as needed.  30 tablet  0  . levothyroxine (SYNTHROID, LEVOTHROID) 112 MCG tablet Take 112 mcg by mouth daily before breakfast. Take sat and sunday      . lidocaine-prilocaine (EMLA) cream Apply 1 application topically as needed. 1-2 hr before each port access  30 g  5  . LORazepam (ATIVAN) 0.5 MG tablet Take 1 tablet (0.5 mg total) by mouth at bedtime as needed for anxiety or sleep ((or nausea)).  30 tablet  0  . metoCLOPramide (REGLAN) 10 MG tablet 1 tab by mouth with meals and bedtime x 3 days after each chemo, then 1 tab by mouth every 6 hrs if needed for nausea  30 tablet  2  . Multiple Vitamins-Minerals (CENTRUM SILVER PO) Take 1 tablet by mouth daily.      Marland Kitchen omeprazole (PRILOSEC) 40 MG capsule Take 1 capsule (40 mg total) by mouth daily.  30 capsule  4  . ondansetron (ZOFRAN) 8 MG tablet 1 tab by mouth twice daily x 3 days after chemo, then 1 tab by mouth every 12 hrs if needed for nausea  30 tablet  2  . potassium chloride SA (K-DUR,KLOR-CON) 20 MEQ tablet Take 1 tablet (20 mEq total) by mouth daily.  30 tablet  2  . prochlorperazine (COMPAZINE) 10 MG tablet 1 tab by mouth with meals and bedtime x 3 days after chemo, then 1 tab by mouth every 6 hrs if needed for nausea  30 tablet  2  . promethazine (PHENERGAN) 25 MG tablet Take 1 tablet (25 mg total) by mouth every 6 (six) hours as needed for nausea or vomiting.  30 tablet  1  . SYNTHROID 125 MCG tablet Take 125 mcg by mouth every morning. Monday-Friday-5 days a week       No current facility-administered medications for this visit.    OBJECTIVE: Middle-aged white woman who appears stated age 58 Vitals:   09/30/13 0951  BP: 129/81  Pulse: 116  Temp:  98.3 F (36.8 C)  Resp: 20   Body mass index is 32.67 kg/(m^2).    ECOG FS: 1 Filed Weights   09/30/13 0951  Weight: 214 lb 12.8 oz (97.433 kg)   Sclerae unicteric, pupils equal and reactive Oropharynx clear and moist-- no lesions noted No cervical or supraclavicular adenopathy Lungs no rales or rhonchi Heart regular rate and rhythm Abd soft, nontender, positive bowel sounds MSK no focal spinal tenderness, no upper extremity lymphedema Neuro: nonfocal, well oriented, positive affect Breasts: The right breast is unremarkable; the left breast is status post lumpectomy. There is no evidence of recurrence. The left axilla is benign Skin: The patient has a blotchy nonpalpable rash chiefly over the dorsal PIP joints. It is not on the palms, there is no scaling or cracking of skin, and she doesn't have a rash in her also in her body. A photograph of this rash is separately scanned.    LAB RESULTS: CBC    Component Value Date/Time   WBC 9.6 09/30/2013 0939   RBC 4.63 09/30/2013 0939   HGB 12.9 09/30/2013 0939   HCT 38.8 09/30/2013 0939   PLT 185 09/30/2013 0939   MCV 83.8 09/30/2013 0939   MCH 27.9 09/30/2013 0939   MCHC 33.2 09/30/2013 0939   RDW 15.3* 09/30/2013 0939   LYMPHSABS 3.0 09/30/2013 0939   MONOABS 1.5* 09/30/2013 0939   EOSABS 0.0 09/30/2013 0939   BASOSABS 0.0 09/30/2013 0939    CMP     Component Value Date/Time   NA 139 09/23/2013 0935   K 3.5 09/23/2013 0935   CO2 21* 09/23/2013 0935   GLUCOSE 176* 09/23/2013 0935   BUN 12.7 09/23/2013 0935   CREATININE 0.8 09/23/2013 0935   CALCIUM 9.4 09/23/2013 0935   PROT 6.7 09/23/2013 0935   ALBUMIN 3.9 09/23/2013 0935   AST 17 09/23/2013 0935   ALT 23 09/23/2013 0935   ALKPHOS 99 09/23/2013 0935   BILITOT 0.46 09/23/2013 0935      STUDIES: No results found.  ASSESSMENT: 58 y.o. [place] woman, status post left breast biopsy 06/03/2013 for a clinical T2 N0, stage IIA invasive ductal carcinoma, grade 3, estrogen and progesterone receptor  positive, with  an MIB-1 of 44%, and HER-2 amplified.  (1) status post left lumpectomy and sentinel lymph node sampling 07-2013 for a pT2 pN0, stage IIA invasive ductal carcinoma, grade 3, with negative margins.  (2) being treated in the adjuvant setting, the plan being to treat with 6 q. three-week doses of docetaxel/carboplatin (AUC 5) given along with trastuzumab/pertuzumab, first dose on 09/02/2013. Neulasta on day 2 for granulocyte support.  (3) trastuzumab and pertuzumab to continue to total one year  (4) adjuvant radiation to follow chemotherapy  (5) adjuvant anti-estrogens to follow radiation  (6)  Depression/anxiety - on Celexa, 20 mg   PLAN: I think Lori Vincent is working really hard to get through the chemotherapy. She is now doing better off Compazine. The main issue is that of fatigue, and if we get ahead of that I think she will be able to complete all 6 cycles is planned.  I am going to add a "Decadron tail" to her antinausea regimen, namely when she finishes 38 mg of Decadron twice a day, she will start the following day to 4 mg with breakfast and continue that for an additional 3 days. I am hopeful is very simple change will make it more bearable for her.  The rash in her hands I do not believe is going to be directly related to her chemotherapy because after all the chemotherapy is systemic and this is very focal. I did send a photo to a dermatologist I., but my advice to her is not to use Neosporin and not to use alcohol in her hands, only using mild soap to wash and let's see if it doesn't clear over the next few days.  Lori Vincent is a good understanding of the overall plan. She agrees with it. She knows a goal of treatment in her cases cure. She will return to see [place] in 2 weeks for cycle 3 of her chemotherapy.   Chauncey Cruel, MD   09/30/2013 10:06 AM

## 2013-09-30 NOTE — Addendum Note (Signed)
Addended by: Laureen Abrahams on: 09/30/2013 05:55 PM   Modules accepted: Orders

## 2013-10-05 ENCOUNTER — Other Ambulatory Visit: Payer: Self-pay | Admitting: *Deleted

## 2013-10-05 DIAGNOSIS — L0390 Cellulitis, unspecified: Secondary | ICD-10-CM

## 2013-10-05 DIAGNOSIS — C50512 Malignant neoplasm of lower-outer quadrant of left female breast: Secondary | ICD-10-CM

## 2013-10-05 MED ORDER — LORAZEPAM 0.5 MG PO TABS: 0.5000 mg | ORAL_TABLET | Freq: Every evening | ORAL | Status: DC | PRN

## 2013-10-13 ENCOUNTER — Telehealth: Payer: Self-pay | Admitting: *Deleted

## 2013-10-13 NOTE — Telephone Encounter (Addendum)
Patient called concerned about dexamethasone side effects.  Driving and has taken three dexamethasone tabs at the same time while eating croissant.  "I was instructed to take two this morning and one with dinner because they think this pill is raising my blood sugar.  I don't want to have a heart attack and can't believe I did this."   Instructed her to expect to have a boost of energy, increased appetite and flushed face or redness but will be fine.  Instructed not to take the steroid late in the day or it may result in trouble sleeping.  Will notify providers.       No new orders per Micah Flesher PA-C.

## 2013-10-14 ENCOUNTER — Ambulatory Visit (HOSPITAL_BASED_OUTPATIENT_CLINIC_OR_DEPARTMENT_OTHER): Payer: BC Managed Care – PPO

## 2013-10-14 ENCOUNTER — Encounter: Payer: Self-pay | Admitting: Physician Assistant

## 2013-10-14 ENCOUNTER — Other Ambulatory Visit (HOSPITAL_BASED_OUTPATIENT_CLINIC_OR_DEPARTMENT_OTHER): Payer: BC Managed Care – PPO

## 2013-10-14 ENCOUNTER — Ambulatory Visit (HOSPITAL_BASED_OUTPATIENT_CLINIC_OR_DEPARTMENT_OTHER): Payer: BC Managed Care – PPO | Admitting: Physician Assistant

## 2013-10-14 VITALS — BP 161/94 | HR 76 | Temp 98.6°F | Resp 18 | Ht 68.0 in | Wt 220.1 lb

## 2013-10-14 DIAGNOSIS — C50919 Malignant neoplasm of unspecified site of unspecified female breast: Secondary | ICD-10-CM

## 2013-10-14 DIAGNOSIS — R197 Diarrhea, unspecified: Secondary | ICD-10-CM

## 2013-10-14 DIAGNOSIS — R21 Rash and other nonspecific skin eruption: Secondary | ICD-10-CM

## 2013-10-14 DIAGNOSIS — C50512 Malignant neoplasm of lower-outer quadrant of left female breast: Secondary | ICD-10-CM

## 2013-10-14 DIAGNOSIS — Z170 Estrogen receptor positive status [ER+]: Secondary | ICD-10-CM

## 2013-10-14 DIAGNOSIS — Z5111 Encounter for antineoplastic chemotherapy: Secondary | ICD-10-CM

## 2013-10-14 DIAGNOSIS — R600 Localized edema: Secondary | ICD-10-CM

## 2013-10-14 DIAGNOSIS — F341 Dysthymic disorder: Secondary | ICD-10-CM

## 2013-10-14 DIAGNOSIS — G609 Hereditary and idiopathic neuropathy, unspecified: Secondary | ICD-10-CM

## 2013-10-14 DIAGNOSIS — Z5112 Encounter for antineoplastic immunotherapy: Secondary | ICD-10-CM

## 2013-10-14 LAB — CBC WITH DIFFERENTIAL/PLATELET
BASO%: 0.5 % (ref 0.0–2.0)
Basophils Absolute: 0 10*3/uL (ref 0.0–0.1)
EOS ABS: 0 10*3/uL (ref 0.0–0.5)
EOS%: 0 % (ref 0.0–7.0)
HCT: 31.5 % — ABNORMAL LOW (ref 34.8–46.6)
HGB: 10.3 g/dL — ABNORMAL LOW (ref 11.6–15.9)
LYMPH%: 12.9 % — ABNORMAL LOW (ref 14.0–49.7)
MCH: 28.1 pg (ref 25.1–34.0)
MCHC: 32.7 g/dL (ref 31.5–36.0)
MCV: 86 fL (ref 79.5–101.0)
MONO#: 0.9 10*3/uL (ref 0.1–0.9)
MONO%: 9.6 % (ref 0.0–14.0)
NEUT%: 77 % — ABNORMAL HIGH (ref 38.4–76.8)
NEUTROS ABS: 7.1 10*3/uL — AB (ref 1.5–6.5)
Platelets: 207 10*3/uL (ref 145–400)
RBC: 3.67 10*6/uL — AB (ref 3.70–5.45)
RDW: 16.3 % — AB (ref 11.2–14.5)
WBC: 9.2 10*3/uL (ref 3.9–10.3)
lymph#: 1.2 10*3/uL (ref 0.9–3.3)

## 2013-10-14 LAB — COMPREHENSIVE METABOLIC PANEL (CC13)
ALBUMIN: 3.5 g/dL (ref 3.5–5.0)
ALT: 25 U/L (ref 0–55)
AST: 18 U/L (ref 5–34)
Alkaline Phosphatase: 81 U/L (ref 40–150)
Anion Gap: 10 mEq/L (ref 3–11)
BUN: 11.4 mg/dL (ref 7.0–26.0)
CO2: 24 mEq/L (ref 22–29)
Calcium: 9 mg/dL (ref 8.4–10.4)
Chloride: 109 mEq/L (ref 98–109)
Creatinine: 0.7 mg/dL (ref 0.6–1.1)
GLUCOSE: 135 mg/dL (ref 70–140)
POTASSIUM: 3.4 meq/L — AB (ref 3.5–5.1)
Sodium: 142 mEq/L (ref 136–145)
Total Bilirubin: 0.45 mg/dL (ref 0.20–1.20)
Total Protein: 6 g/dL — ABNORMAL LOW (ref 6.4–8.3)

## 2013-10-14 LAB — MAGNESIUM (CC13): MAGNESIUM: 2.3 mg/dL (ref 1.5–2.5)

## 2013-10-14 MED ORDER — ONDANSETRON 16 MG/50ML IVPB (CHCC)
16.0000 mg | Freq: Once | INTRAVENOUS | Status: AC
  Administered 2013-10-14: 16 mg via INTRAVENOUS

## 2013-10-14 MED ORDER — DOCETAXEL CHEMO INJECTION 160 MG/16ML
75.0000 mg/m2 | Freq: Once | INTRAVENOUS | Status: AC
  Administered 2013-10-14: 170 mg via INTRAVENOUS
  Filled 2013-10-14: qty 17

## 2013-10-14 MED ORDER — SODIUM CHLORIDE 0.9 % IJ SOLN
10.0000 mL | INTRAMUSCULAR | Status: DC | PRN
  Administered 2013-10-14: 10 mL
  Filled 2013-10-14: qty 10

## 2013-10-14 MED ORDER — ONDANSETRON 16 MG/50ML IVPB (CHCC)
INTRAVENOUS | Status: AC
  Filled 2013-10-14: qty 16

## 2013-10-14 MED ORDER — SODIUM CHLORIDE 0.9 % IV SOLN
420.0000 mg | Freq: Once | INTRAVENOUS | Status: AC
  Administered 2013-10-14: 420 mg via INTRAVENOUS
  Filled 2013-10-14: qty 14

## 2013-10-14 MED ORDER — HEPARIN SOD (PORK) LOCK FLUSH 100 UNIT/ML IV SOLN
500.0000 [IU] | Freq: Once | INTRAVENOUS | Status: AC | PRN
  Administered 2013-10-14: 500 [IU]
  Filled 2013-10-14: qty 5

## 2013-10-14 MED ORDER — DEXAMETHASONE SODIUM PHOSPHATE 20 MG/5ML IJ SOLN
20.0000 mg | Freq: Once | INTRAMUSCULAR | Status: AC
  Administered 2013-10-14: 20 mg via INTRAVENOUS

## 2013-10-14 MED ORDER — ACETAMINOPHEN 325 MG PO TABS
ORAL_TABLET | ORAL | Status: AC
  Filled 2013-10-14: qty 2

## 2013-10-14 MED ORDER — SODIUM CHLORIDE 0.9 % IV SOLN
750.0000 mg | Freq: Once | INTRAVENOUS | Status: AC
  Administered 2013-10-14: 750 mg via INTRAVENOUS
  Filled 2013-10-14: qty 75

## 2013-10-14 MED ORDER — DIPHENHYDRAMINE HCL 25 MG PO CAPS
50.0000 mg | ORAL_CAPSULE | Freq: Once | ORAL | Status: AC
  Administered 2013-10-14: 50 mg via ORAL

## 2013-10-14 MED ORDER — DIPHENHYDRAMINE HCL 25 MG PO CAPS
ORAL_CAPSULE | ORAL | Status: AC
  Filled 2013-10-14: qty 2

## 2013-10-14 MED ORDER — SODIUM CHLORIDE 0.9 % IV SOLN
6.0000 mg/kg | Freq: Once | INTRAVENOUS | Status: AC
  Administered 2013-10-14: 588 mg via INTRAVENOUS
  Filled 2013-10-14: qty 28

## 2013-10-14 MED ORDER — ACETAMINOPHEN 325 MG PO TABS
650.0000 mg | ORAL_TABLET | Freq: Once | ORAL | Status: AC
  Administered 2013-10-14: 650 mg via ORAL

## 2013-10-14 MED ORDER — SODIUM CHLORIDE 0.9 % IV SOLN
Freq: Once | INTRAVENOUS | Status: AC
  Administered 2013-10-14: 12:00:00 via INTRAVENOUS

## 2013-10-14 MED ORDER — DEXAMETHASONE SODIUM PHOSPHATE 20 MG/5ML IJ SOLN
INTRAMUSCULAR | Status: AC
  Filled 2013-10-14: qty 5

## 2013-10-14 NOTE — Patient Instructions (Signed)
[facility] Discharge Instructions for Patients Receiving Chemotherapy  Today you received the following chemotherapy agents herceptin, perjeta, taxotere and carboplatin.  To help prevent nausea and vomiting after your treatment, we encourage you to take your nausea medication zofran, phenergan, ativan and reglan.   If you develop nausea and vomiting that is not controlled by your nausea medication, call the clinic.   BELOW ARE SYMPTOMS THAT SHOULD BE REPORTED IMMEDIATELY:  *FEVER GREATER THAN 100.5 F  *CHILLS WITH OR WITHOUT FEVER  NAUSEA AND VOMITING THAT IS NOT CONTROLLED WITH YOUR NAUSEA MEDICATION  *UNUSUAL SHORTNESS OF BREATH  *UNUSUAL BRUISING OR BLEEDING  TENDERNESS IN MOUTH AND THROAT WITH OR WITHOUT PRESENCE OF ULCERS  *URINARY PROBLEMS  *BOWEL PROBLEMS  UNUSUAL RASH Items with * indicate a potential emergency and should be followed up as soon as possible.  Feel free to call the clinic you have any questions or concerns. The clinic phone number is [phone].

## 2013-10-14 NOTE — Progress Notes (Signed)
Lori Vincent  [phone] [phone]     ID: Lori Vincent OB: 19-Jun-1955  MR#: [id number]  NIO#:[id number]  PCP: [name], NP GYN:  [name], MD SU: [name], MD OTHER MD: Arloa Koh, MD  CHIEF COMPLAINT: Left breast cancer/adjuvant chemotherapy   HISTORY OF PRESENT ILLNESS: Lori Vincent [facility] showing a possible mass in the left breast. Diagnostic left mammography and ultrasonography at the breast center 04/26/2013 confirmed an oval mass with microcalcifications in the 6:00 position of the left breast. There was an adjacent partially obscured 1.2 cm mass. On physical exam there was no palpable mass in the left breast. By ultrasonography, there was an oval mass at the 6:00 position measuring 1.4 cm, with a second mass measuring 1.1 cm. There were no abnormal lymph nodes noted in the left axilla.  Biopsy of the 6:00 mass in the left breast January 2015 showed ([id number]) and invasive ductal carcinoma, grade 3, estrogen receptor 97% positive, progesterone receptor 75% positive, with strong staining intensity, and MIB-144%. HER-2 was amplified, with the signals ratio 4.18 and in number per cell of 9.40 .  On 06/08/2013 the patient underwent bilateral breast MRI which showed a dumbbell-shaped area in the central left breast consisting of a 2 cm mass and 2 extensions or an adjacent masses, measuring a total of 2.9 cm. There were no abnormal appearing lymph nodes for any other areas of concern.  The patient's subsequent history is as detailed below.   INTERVAL HISTORY: Lori Vincent returns alone today for followup of her left breast cancer. She is due for day 1 cycle 3 of 6 planned q. three-week doses of docetaxel/carboplatin given along with trastuzumab/pertuzumab in the adjuvant setting. She receives Neulasta on day 2 for granulocyte support.  Lori Vincent had many questions and concerns to  discuss today. For the first time, she experienced some peripheral neuropathy following cycle 2. This affected the feet but not the hands. The left foot was slightly greater than the right, and the numbness was primarily present in the toes. It has improved significantly over the past week, and is currently very mild. It is not affecting her day-to-day activities, and her upper extremities have not been affected.  Lori Vincent continues to have loose bowel movements associated with the pertuzumab.  She tells me she is able to control this reasonably well with the use of both Imodium and Questran, but often has multiple soft bowel movements per day. She is trying hard to keep herself well hydrated, but often finds this difficult to do.  Lori Vincent developed a rash on her hands bilaterally following cycle 2. This was a dark red rash notable on the dorsal surfaces of the hands bilaterally, especially affecting the fingers. It was not particularly painful, but did itch somewhat. She's had no significant cracking or peeling, but the skin is still slightly red and dry. She is also started having some swelling in her ankles bilaterally. This occurs primarily during the day, and decreases at night with elevation. She's had no pain in the lower extremities, and denies any redness.  REVIEW OF SYSTEMS: Lori Vincent has had no fevers, chills, or night sweats. She does have moderate fatigue, but is still working full-time. She's had no additional rashes or skin changes and denies any abnormal bruising or bleeding. She denies any significant problems with nausea and has had no emesis. She's had no change in urinary habits. She's had no increased cough, phlegm production, or  shortness of breath and denies any chest pain or palpitations. She's had no abnormal headaches or dizziness. She does have some blurry vision. She's had no mouth ulcers or oral sensitivity. She has some joint pain associated with arthritis, but denies any new or  unusual myalgias, arthralgias, or bony pain.  A detailed review of systems is otherwise stable and noncontributory.   PAST MEDICAL HISTORY: Past Medical History  Diagnosis Date  . Breast cancer 06/03/13    left, 6:00 o'clock  . Thyroid disease     hypothyroidism  . Hypothyroidism   . Depression     PAST SURGICAL HISTORY: Past Surgical History  Procedure Laterality Date  . Tonsillectomy  1972  . Cesarean section  04/1999  . Breast biopsy  1/14    lt  . Dilation and curettage of uterus    . Colonoscopy  2013  . Breast lumpectomy with needle localization and axillary sentinel lymph node bx Left 07/26/2013    Procedure: BREAST LUMPECTOMY WITH NEEDLE LOCALIZATION AND AXILLARY SENTINEL LYMPH NODE BX;  Surgeon: Shann Medal, MD;  Location: [facility];  Service: General;  Laterality: Left;  Marland Kitchen Mole removal Right 07/26/2013    Procedure: MOLE REMOVAL UNDER RIGHT BREAST;  Surgeon: Shann Medal, MD;  Location: [facility];  Service: General;  Laterality: Right;  . Portacath placement Right 07/26/2013    Procedure: INSERTION PORT-A-CATH;  Surgeon: Shann Medal, MD;  Location: [facility];  Service: General;  Laterality: Right;    FAMILY HISTORY Family History  Problem Relation Age of Onset  . Stroke Mother   . Diabetes Father   . Cancer Maternal Grandmother 33    colon   the patient's father died at the age of 41 in a car accident. The patient's mother died at the age of 31 from a stroke. The patient has one adopted brother. She has one biological sister. The patient's mother's mother was diagnosed with colon cancer at the age of 44. There is no history of breast oropharynx cancer in the family to the patient's knowledge.  GYNECOLOGIC HISTORY:   (Reviewed 10/14/2013) Menarche age 45. First live birth age 67. The patient is GX P1. She took birth control pills for approximately 12 years remotely. She had a period in February of 2014 and then in  December of 2014. She had been started on hormone replacement in December. That has since been discontinued.  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 Her husband died at [place] 3 years ago with soft tissue sarcoma. The patient's significant other also works as a Geophysicist/field seismologist. At home it is just the patient and her daughter 56, 12 years old. The patient has 2 stepchildren from her earlier marriage. 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  ADVANCED DIRECTIVES: Not in place   HEALTH MAINTENANCE:  (Updated 10/14/2013) History  Substance Use Topics  . Smoking status: Never Smoker   . Smokeless tobacco: Never Used  . Alcohol Use: Yes     Comment: 5-7 glasses per week of red wine     Colonoscopy: July 2014  PAP: November 2014  Bone density: November 2014  Lipid panel: Not on file  Allergies  Allergen Reactions  . No Known Drug Allergy     Current Outpatient Prescriptions  Medication Sig Dispense Refill  .  citalopram (CELEXA) 20 MG tablet Take 1 tablet (20 mg total) by mouth daily.  30 tablet  5  . dexamethasone (DECADRON) 4 MG tablet 2 tabs by mouth with food twice daily on day before and 3 days after each chemo dose  30 tablet  2  . levothyroxine (SYNTHROID, LEVOTHROID) 112 MCG tablet Take 112 mcg by mouth daily before breakfast. Take sat and sunday      . lidocaine-prilocaine (EMLA) cream Apply 1 application topically as needed. 1-2 hr before each port access  30 g  5  . LORazepam (ATIVAN) 0.5 MG tablet Take 0.5 mg by mouth 2 (two) times daily.      . metoCLOPramide (REGLAN) 10 MG tablet 1 tab by mouth with meals  x 3 days after each chemo, then 1 tab by mouth every 6 hrs if needed for nausea      . omeprazole (PRILOSEC) 40 MG capsule Take 1 capsule (40 mg total) by mouth daily.  30 capsule  4  . potassium  chloride SA (K-DUR,KLOR-CON) 20 MEQ tablet Take 1 tablet (20 mEq total) by mouth daily.  30 tablet  2  . promethazine (PHENERGAN) 25 MG tablet Take 25 mg by mouth every 6 (six) hours as needed for nausea or vomiting. Pt taking at bedtime.      Marland Kitchen SYNTHROID 125 MCG tablet Take 125 mcg by mouth every morning. Monday-Friday-5 days a week      . cholestyramine light (PREVALITE) 4 G packet Take 1 packet (4 g total) by mouth 2 (two) times daily as needed (diarrhea).  42 packet  1  . HYDROcodone-acetaminophen (NORCO/VICODIN) 5-325 MG per tablet Take 1-2 tablets by mouth every 6 (six) hours as needed.  30 tablet  0  . ondansetron (ZOFRAN) 8 MG tablet 1 tab by mouth twice daily x 3 days after chemo, then 1 tab by mouth every 12 hrs if needed for nausea  30 tablet  2  . prochlorperazine (COMPAZINE) 10 MG tablet        No current facility-administered medications for this visit.   Facility-Administered Medications Ordered in Other Visits  Medication Dose Route Frequency Provider Last Rate Last Dose  . sodium chloride 0.9 % injection 10 mL  10 mL Intracatheter PRN Danille Oppedisano Milda Smart, PA-C   10 mL at 10/14/13 1528    OBJECTIVE: Middle-aged white woman who appears tired but is in no acute distress Filed Vitals:   10/14/13 1025  BP: 161/94  Pulse: 76  Temp: 98.6 F (37 C)  Resp: 18   Body mass index is 33.47 kg/(m^2).    ECOG FS: 1 Filed Weights   10/14/13 1025  Weight: 220 lb 1.6 oz (99.837 kg)   Physical Exam: HEENT:  Sclerae anicteric.  Oropharynx clear and moist. No mucositis. No evidence of oropharyngeal candidiasis. Neck supple, trachea midline.  NODES:  No cervical or supraclavicular lymphadenopathy palpated.  BREAST EXAM:  Breast exam deferred. Axillae are benign bilaterally, with no palpable lymphadenopathy. LUNGS:  Clear to auscultation bilaterally.  No wheezes or rhonchi HEART:  Regular rate and rhythm.  ABDOMEN:  Soft, nontender.  No organomegaly or masses palpated. Positive bowel sounds.   MSK:  No focal spinal tenderness to palpation. Good range of motion bilaterally in the upper extremities. EXTREMITIES:  No peripheral edema.  No lymphedema in the left upper extremity. SKIN:  There is still some mild erythema noted on the dorsal surface of the hands bilaterally, but no cracking or peeling. No vesicles or pustules.  No additional rashes are noted. No excessive ecchymoses. No petechiae. No pallor. NEURO:  Nonfocal. Well oriented. Appropriate affect.   LAB RESULTS: CBC    Component Value Date/Time   WBC 9.2 10/14/2013 0929   RBC 3.67* 10/14/2013 0929   HGB 10.3* 10/14/2013 0929   HCT 31.5* 10/14/2013 0929   PLT 207 10/14/2013 0929   MCV 86.0 10/14/2013 0929   MCH 28.1 10/14/2013 0929   MCHC 32.7 10/14/2013 0929   RDW 16.3* 10/14/2013 0929   LYMPHSABS 1.2 10/14/2013 0929   MONOABS 0.9 10/14/2013 0929   EOSABS 0.0 10/14/2013 0929   BASOSABS 0.0 10/14/2013 0929    CMP     Component Value Date/Time   NA 142 10/14/2013 0929   K 3.4* 10/14/2013 0929   CO2 24 10/14/2013 0929   GLUCOSE 135 10/14/2013 0929   BUN 11.4 10/14/2013 0929   CREATININE 0.7 10/14/2013 0929   CALCIUM 9.0 10/14/2013 0929   PROT 6.0* 10/14/2013 0929   ALBUMIN 3.5 10/14/2013 0929   AST 18 10/14/2013 0929   ALT 25 10/14/2013 0929   ALKPHOS 81 10/14/2013 0929   BILITOT 0.45 10/14/2013 0929      STUDIES: No results found.  ASSESSMENT: 58 y.o. [place] woman, status post left breast biopsy 06/03/2013 for a clinical T2 N0, stage IIA invasive ductal carcinoma, grade 3, estrogen and progesterone receptor positive, with an MIB-1 of 44%, and HER-2 amplified.  (1) status post left lumpectomy and sentinel lymph node sampling 07-2013 for a pT2 pN0, stage IIA invasive ductal carcinoma, grade 3, with negative margins.  (2) being treated in the adjuvant setting, the plan being to treat with 6 q. three-week doses of docetaxel/carboplatin (AUC 5) given along with trastuzumab/pertuzumab, first dose on 09/02/2013. Neulasta on day  2 for granulocyte support.  (3) trastuzumab and pertuzumab to continue to total one year  (4) adjuvant radiation to follow chemotherapy  (5) adjuvant anti-estrogens to follow radiation  (6)  Depression/anxiety - on Celexa, 20 mg   PLAN: The majority of our 30 minute appointment today was spent answering Lori Vincent's questions, addressing her concerns, discussing her side effects, reviewing her treatment plan, and coordinating care.  Lori Vincent will proceed to treatment today as scheduled for her third cycle of adjuvant chemotherapy. As discussed last week with Dr. Jana Hakim, she will use a "steroid tail" and will take an additional 4 mg of dexamethasone with breakfast only for an additional 3 days with this cycle. Hopefully this will decrease her fatigue and the "crash" she experiences after treatment.  We'll continue to follow the rash on her hands very closely, and she will contact us if it occurs again with this cycle.  We'll also follow her very closely for the development of any additional peripheral neuropathy. I encouraged her to elevate her feet as much as possible, drink plenty of water, and limit her sodium intake to decrease the swelling in her feet and ankles. If that worsens, she will let us know.  Cassidie voices her understanding and agreement with the above plan. As always, she knows to call with any changes or problems. Otherwise, I will plan on seeing her again next week for assessment of chemotoxicity on May 29. (I will mention that she may possibly be out of town next week, and if she is, she will call to cancel that appointment. Accordingly, I have also scheduled her a lab appointment for Monday, June 1, primarily to assess for neutropenia.)  Lori Burrow, PA-C   10/14/2013 4:21 PM

## 2013-10-15 ENCOUNTER — Telehealth: Payer: Self-pay | Admitting: Physician Assistant

## 2013-10-15 ENCOUNTER — Ambulatory Visit (HOSPITAL_BASED_OUTPATIENT_CLINIC_OR_DEPARTMENT_OTHER): Payer: BC Managed Care – PPO

## 2013-10-15 ENCOUNTER — Telehealth: Payer: Self-pay | Admitting: *Deleted

## 2013-10-15 VITALS — BP 145/78 | HR 71 | Temp 97.4°F | Resp 18

## 2013-10-15 DIAGNOSIS — C50512 Malignant neoplasm of lower-outer quadrant of left female breast: Secondary | ICD-10-CM

## 2013-10-15 DIAGNOSIS — C50919 Malignant neoplasm of unspecified site of unspecified female breast: Secondary | ICD-10-CM

## 2013-10-15 DIAGNOSIS — Z5189 Encounter for other specified aftercare: Secondary | ICD-10-CM

## 2013-10-15 MED ORDER — PEGFILGRASTIM INJECTION 6 MG/0.6ML
6.0000 mg | Freq: Once | SUBCUTANEOUS | Status: AC
  Administered 2013-10-15: 6 mg via SUBCUTANEOUS
  Filled 2013-10-15: qty 0.6

## 2013-10-15 NOTE — Patient Instructions (Signed)

## 2013-10-15 NOTE — Telephone Encounter (Signed)
Per staff message and POF I have scheduled appts.  JMW  

## 2013-10-15 NOTE — Telephone Encounter (Signed)
, °

## 2013-10-21 ENCOUNTER — Telehealth: Payer: Self-pay | Admitting: Oncology

## 2013-10-21 NOTE — Telephone Encounter (Signed)
returned pt call and cx 5.29.15 appt per pt due to pt out of town

## 2013-10-22 ENCOUNTER — Ambulatory Visit: Payer: BC Managed Care – PPO | Admitting: Physician Assistant

## 2013-10-22 ENCOUNTER — Other Ambulatory Visit: Payer: BC Managed Care – PPO

## 2013-10-25 ENCOUNTER — Other Ambulatory Visit (HOSPITAL_BASED_OUTPATIENT_CLINIC_OR_DEPARTMENT_OTHER): Payer: BC Managed Care – PPO

## 2013-10-25 DIAGNOSIS — C50919 Malignant neoplasm of unspecified site of unspecified female breast: Secondary | ICD-10-CM

## 2013-10-25 DIAGNOSIS — C50512 Malignant neoplasm of lower-outer quadrant of left female breast: Secondary | ICD-10-CM

## 2013-10-25 LAB — CBC WITH DIFFERENTIAL/PLATELET
BASO%: 0.5 % (ref 0.0–2.0)
Basophils Absolute: 0 10*3/uL (ref 0.0–0.1)
EOS%: 0 % (ref 0.0–7.0)
Eosinophils Absolute: 0 10*3/uL (ref 0.0–0.5)
HEMATOCRIT: 33.7 % — AB (ref 34.8–46.6)
HGB: 11.1 g/dL — ABNORMAL LOW (ref 11.6–15.9)
LYMPH%: 27.9 % (ref 14.0–49.7)
MCH: 27.9 pg (ref 25.1–34.0)
MCHC: 32.9 g/dL (ref 31.5–36.0)
MCV: 84.8 fL (ref 79.5–101.0)
MONO#: 0.8 10*3/uL (ref 0.1–0.9)
MONO%: 9.6 % (ref 0.0–14.0)
NEUT#: 5.2 10*3/uL (ref 1.5–6.5)
NEUT%: 62 % (ref 38.4–76.8)
PLATELETS: 184 10*3/uL (ref 145–400)
RBC: 3.97 10*6/uL (ref 3.70–5.45)
RDW: 17.4 % — ABNORMAL HIGH (ref 11.2–14.5)
WBC: 8.4 10*3/uL (ref 3.9–10.3)
lymph#: 2.3 10*3/uL (ref 0.9–3.3)

## 2013-10-25 LAB — COMPREHENSIVE METABOLIC PANEL (CC13)
ALT: 20 U/L (ref 0–55)
ANION GAP: 15 meq/L — AB (ref 3–11)
AST: 17 U/L (ref 5–34)
Albumin: 3.7 g/dL (ref 3.5–5.0)
Alkaline Phosphatase: 103 U/L (ref 40–150)
BUN: 9.7 mg/dL (ref 7.0–26.0)
CO2: 21 mEq/L — ABNORMAL LOW (ref 22–29)
CREATININE: 0.9 mg/dL (ref 0.6–1.1)
Calcium: 9.1 mg/dL (ref 8.4–10.4)
Chloride: 104 mEq/L (ref 98–109)
Glucose: 91 mg/dl (ref 70–140)
Potassium: 3.2 mEq/L — ABNORMAL LOW (ref 3.5–5.1)
Sodium: 140 mEq/L (ref 136–145)
Total Bilirubin: 0.64 mg/dL (ref 0.20–1.20)
Total Protein: 6.6 g/dL (ref 6.4–8.3)

## 2013-10-25 LAB — MAGNESIUM (CC13): Magnesium: 2 mg/dl (ref 1.5–2.5)

## 2013-10-26 ENCOUNTER — Telehealth: Payer: Self-pay | Admitting: *Deleted

## 2013-10-26 NOTE — Telephone Encounter (Signed)
Called to inform pt of lab results/ Potassium(3.2L). Asked pt if she had been taking Potassium as prescribed. Pt said no she hadn't because she had been trying to get over a cold. I asked pt if she had any fever with this cold, pt said no and been taking Advil Cold and Sinus. I encouraged her to drink plenty of fluids. I told pt to start back taking 1- 20 mEq potassium tablet daily per PA-C. Pt agreed to plan. Pt verbalized understanding. No further concerns. Message to be forwarded to [employer], PA-C.

## 2013-11-04 ENCOUNTER — Ambulatory Visit (HOSPITAL_BASED_OUTPATIENT_CLINIC_OR_DEPARTMENT_OTHER): Payer: BC Managed Care – PPO | Admitting: Physician Assistant

## 2013-11-04 ENCOUNTER — Encounter: Payer: Self-pay | Admitting: Physician Assistant

## 2013-11-04 ENCOUNTER — Ambulatory Visit (HOSPITAL_BASED_OUTPATIENT_CLINIC_OR_DEPARTMENT_OTHER): Payer: BC Managed Care – PPO

## 2013-11-04 ENCOUNTER — Other Ambulatory Visit (HOSPITAL_BASED_OUTPATIENT_CLINIC_OR_DEPARTMENT_OTHER): Payer: BC Managed Care – PPO

## 2013-11-04 VITALS — BP 166/91 | HR 88 | Temp 98.4°F | Resp 18 | Ht 68.0 in | Wt 212.8 lb

## 2013-11-04 DIAGNOSIS — C50919 Malignant neoplasm of unspecified site of unspecified female breast: Secondary | ICD-10-CM

## 2013-11-04 DIAGNOSIS — R600 Localized edema: Secondary | ICD-10-CM

## 2013-11-04 DIAGNOSIS — Z5111 Encounter for antineoplastic chemotherapy: Secondary | ICD-10-CM

## 2013-11-04 DIAGNOSIS — Z5112 Encounter for antineoplastic immunotherapy: Secondary | ICD-10-CM

## 2013-11-04 DIAGNOSIS — F411 Generalized anxiety disorder: Secondary | ICD-10-CM

## 2013-11-04 DIAGNOSIS — Z170 Estrogen receptor positive status [ER+]: Secondary | ICD-10-CM

## 2013-11-04 DIAGNOSIS — E876 Hypokalemia: Secondary | ICD-10-CM

## 2013-11-04 DIAGNOSIS — R197 Diarrhea, unspecified: Secondary | ICD-10-CM

## 2013-11-04 DIAGNOSIS — C50512 Malignant neoplasm of lower-outer quadrant of left female breast: Secondary | ICD-10-CM

## 2013-11-04 DIAGNOSIS — F329 Major depressive disorder, single episode, unspecified: Secondary | ICD-10-CM

## 2013-11-04 DIAGNOSIS — F3289 Other specified depressive episodes: Secondary | ICD-10-CM

## 2013-11-04 LAB — CBC WITH DIFFERENTIAL/PLATELET
BASO%: 0.2 % (ref 0.0–2.0)
Basophils Absolute: 0 10*3/uL (ref 0.0–0.1)
EOS%: 0 % (ref 0.0–7.0)
Eosinophils Absolute: 0 10*3/uL (ref 0.0–0.5)
HCT: 32.2 % — ABNORMAL LOW (ref 34.8–46.6)
HGB: 10.3 g/dL — ABNORMAL LOW (ref 11.6–15.9)
LYMPH#: 0.6 10*3/uL — AB (ref 0.9–3.3)
LYMPH%: 8.3 % — ABNORMAL LOW (ref 14.0–49.7)
MCH: 28 pg (ref 25.1–34.0)
MCHC: 32.1 g/dL (ref 31.5–36.0)
MCV: 87.1 fL (ref 79.5–101.0)
MONO#: 0.4 10*3/uL (ref 0.1–0.9)
MONO%: 5.5 % (ref 0.0–14.0)
NEUT#: 6.4 10*3/uL (ref 1.5–6.5)
NEUT%: 86 % — AB (ref 38.4–76.8)
Platelets: 249 10*3/uL (ref 145–400)
RBC: 3.69 10*6/uL — AB (ref 3.70–5.45)
RDW: 18.9 % — AB (ref 11.2–14.5)
WBC: 7.5 10*3/uL (ref 3.9–10.3)

## 2013-11-04 LAB — COMPREHENSIVE METABOLIC PANEL (CC13)
ALT: 23 U/L (ref 0–55)
AST: 16 U/L (ref 5–34)
Albumin: 3.7 g/dL (ref 3.5–5.0)
Alkaline Phosphatase: 92 U/L (ref 40–150)
Anion Gap: 8 mEq/L (ref 3–11)
BUN: 15.2 mg/dL (ref 7.0–26.0)
CALCIUM: 9.5 mg/dL (ref 8.4–10.4)
CHLORIDE: 104 meq/L (ref 98–109)
CO2: 27 meq/L (ref 22–29)
Creatinine: 0.7 mg/dL (ref 0.6–1.1)
Glucose: 148 mg/dl — ABNORMAL HIGH (ref 70–140)
Potassium: 3.8 mEq/L (ref 3.5–5.1)
SODIUM: 139 meq/L (ref 136–145)
TOTAL PROTEIN: 6.5 g/dL (ref 6.4–8.3)
Total Bilirubin: 0.49 mg/dL (ref 0.20–1.20)

## 2013-11-04 LAB — MAGNESIUM (CC13): Magnesium: 2 mg/dl (ref 1.5–2.5)

## 2013-11-04 MED ORDER — HEPARIN SOD (PORK) LOCK FLUSH 100 UNIT/ML IV SOLN
500.0000 [IU] | Freq: Once | INTRAVENOUS | Status: AC | PRN
  Administered 2013-11-04: 500 [IU]
  Filled 2013-11-04: qty 5

## 2013-11-04 MED ORDER — ONDANSETRON 16 MG/50ML IVPB (CHCC)
16.0000 mg | Freq: Once | INTRAVENOUS | Status: AC
  Administered 2013-11-04: 16 mg via INTRAVENOUS

## 2013-11-04 MED ORDER — DEXAMETHASONE SODIUM PHOSPHATE 20 MG/5ML IJ SOLN
20.0000 mg | Freq: Once | INTRAMUSCULAR | Status: AC
  Administered 2013-11-04: 20 mg via INTRAVENOUS

## 2013-11-04 MED ORDER — SODIUM CHLORIDE 0.9 % IV SOLN
Freq: Once | INTRAVENOUS | Status: AC
  Administered 2013-11-04: 11:00:00 via INTRAVENOUS

## 2013-11-04 MED ORDER — SODIUM CHLORIDE 0.9 % IJ SOLN
10.0000 mL | INTRAMUSCULAR | Status: DC | PRN
  Administered 2013-11-04: 10 mL
  Filled 2013-11-04: qty 10

## 2013-11-04 MED ORDER — DOCETAXEL CHEMO INJECTION 160 MG/16ML
75.0000 mg/m2 | Freq: Once | INTRAVENOUS | Status: AC
  Administered 2013-11-04: 170 mg via INTRAVENOUS
  Filled 2013-11-04: qty 17

## 2013-11-04 MED ORDER — ACETAMINOPHEN 325 MG PO TABS
650.0000 mg | ORAL_TABLET | Freq: Once | ORAL | Status: AC
  Administered 2013-11-04: 650 mg via ORAL

## 2013-11-04 MED ORDER — SODIUM CHLORIDE 0.9 % IV SOLN
750.0000 mg | Freq: Once | INTRAVENOUS | Status: AC
  Administered 2013-11-04: 750 mg via INTRAVENOUS
  Filled 2013-11-04: qty 75

## 2013-11-04 MED ORDER — ACETAMINOPHEN 325 MG PO TABS
ORAL_TABLET | ORAL | Status: AC
  Filled 2013-11-04: qty 2

## 2013-11-04 MED ORDER — SODIUM CHLORIDE 0.9 % IV SOLN
420.0000 mg | Freq: Once | INTRAVENOUS | Status: AC
  Administered 2013-11-04: 420 mg via INTRAVENOUS
  Filled 2013-11-04: qty 14

## 2013-11-04 MED ORDER — DEXAMETHASONE SODIUM PHOSPHATE 20 MG/5ML IJ SOLN
INTRAMUSCULAR | Status: AC
  Filled 2013-11-04: qty 5

## 2013-11-04 MED ORDER — ONDANSETRON 16 MG/50ML IVPB (CHCC)
INTRAVENOUS | Status: AC
  Filled 2013-11-04: qty 16

## 2013-11-04 MED ORDER — DIPHENHYDRAMINE HCL 25 MG PO CAPS
50.0000 mg | ORAL_CAPSULE | Freq: Once | ORAL | Status: AC
  Administered 2013-11-04: 50 mg via ORAL

## 2013-11-04 MED ORDER — DIPHENHYDRAMINE HCL 25 MG PO CAPS
ORAL_CAPSULE | ORAL | Status: AC
  Filled 2013-11-04: qty 2

## 2013-11-04 MED ORDER — SODIUM CHLORIDE 0.9 % IV SOLN
6.0000 mg/kg | Freq: Once | INTRAVENOUS | Status: AC
  Administered 2013-11-04: 588 mg via INTRAVENOUS
  Filled 2013-11-04: qty 28

## 2013-11-04 NOTE — Patient Instructions (Addendum)
[facility] Discharge Instructions for Patients Receiving Chemotherapy  Today you received the following chemotherapy agents Herceptin, Perjeta, Taxotere, Carboplatin  To help prevent nausea and vomiting after your treatment, we encourage you to take your nausea medication Compazine   If you develop nausea and vomiting that is not controlled by your nausea medication, call the clinic.   BELOW ARE SYMPTOMS THAT SHOULD BE REPORTED IMMEDIATELY:  *FEVER GREATER THAN 100.5 F  *CHILLS WITH OR WITHOUT FEVER  NAUSEA AND VOMITING THAT IS NOT CONTROLLED WITH YOUR NAUSEA MEDICATION  *UNUSUAL SHORTNESS OF BREATH  *UNUSUAL BRUISING OR BLEEDING  TENDERNESS IN MOUTH AND THROAT WITH OR WITHOUT PRESENCE OF ULCERS  *URINARY PROBLEMS  *BOWEL PROBLEMS  UNUSUAL RASH Items with * indicate a potential emergency and should be followed up as soon as possible.  Feel free to call the clinic you have any questions or concerns. The clinic phone number is [phone].

## 2013-11-04 NOTE — Progress Notes (Signed)
[facility]  Telephone:([phone]) [phone] Fax:(336) [phone]     ID: Lori Vincent OB: 1956/05/03  MR#: [id number]  BUL#:[id number]  PCP: [name], NP GYN:  [name], MD SU: [name], MD OTHER MD: Arloa Koh, MD  CHIEF COMPLAINT: Left breast cancer/adjuvant chemotherapy   HISTORY OF PRESENT ILLNESS: Lori Vincent [facility] showing a possible mass in the left breast. Diagnostic left mammography and ultrasonography at the breast center 04/26/2013 confirmed an oval mass with microcalcifications in the 6:00 position of the left breast. There was an adjacent partially obscured 1.2 cm mass. On physical exam there was no palpable mass in the left breast. By ultrasonography, there was an oval mass at the 6:00 position measuring 1.4 cm, with a second mass measuring 1.1 cm. There were no abnormal lymph nodes noted in the left axilla.  Biopsy of the 6:00 mass in the left breast January 2015 showed ([id number]) and invasive ductal carcinoma, grade 3, estrogen receptor 97% positive, progesterone receptor 75% positive, with strong staining intensity, and MIB-144%. HER-2 was amplified, with the signals ratio 4.18 and in number per cell of 9.40 .  On 06/08/2013 the patient underwent bilateral breast MRI which showed a dumbbell-shaped area in the central left breast consisting of a 2 cm mass and 2 extensions or an adjacent masses, measuring a total of 2.9 cm. There were no abnormal appearing lymph nodes for any other areas of concern.  The patient's subsequent history is as detailed below.   INTERVAL HISTORY: Lori Vincent returns alone today for followup of her left breast cancer. She is due for day 1 cycle 4 of 6 planned q. three-week doses of docetaxel/carboplatin today, given along with trastuzumab/pertuzumab in the adjuvant setting. She receives Neulasta on day 2 for granulocyte support.  Lori Vincent is tolerating chemotherapy  reasonably well, but did have several questions to discuss today. Following her treatment 3 weeks ago, she was able to take a trip to the beach. She enjoyed the trip, but call a "cold" which caused sinus congestion, rhinorrhea, and a cough. Fortunately, she's had no fevers or chills, and the cold symptoms are improving.  She did take the dexamethasone, 4 mg with breakfast only, for an additional 3 days following cycle 3. She definitely believes that this helped improve her energy level and decrease her fatigue. Her glucose level increases only slightly with the dexamethasone, 143 today nonfasting.   She again developed a rash on her hands for the first few days following treatment, but this resolved quickly with no residual problems. There's been no actual cracking or peeling, but there was significant erythema. Fortunately, the swelling in her lower extremities has also improved significantly. She has had only a slight tingling sensation in the tips of her toes intermittently, but no signs of neuropathy in the upper extremities. This is not affecting her ability to walk, and is affecting none of her day-to-day activities. She does not believe it is worsening.   She has completely discontinued Compazine which she found cost restlessness and mild extrapyrimidal effects.  (This has been listed as an allergies/intolerances in her medical records.) She has been utilizing metoclopramide affectively during the day, tolerating very well. She takes Phenergan at night which helps the nausea, but also helps her sleep.  REVIEW OF SYSTEMS: Lori Vincent has had no fevers, chills, or night sweats. She continues to work full-time despite moderate fatigue. She denies any signs of abnormal bruising or bleeding. She's managing her nausea  well, and has had no emesis. She is trying to keep herself well hydrated. Her appetite is decreased. She does alternating somewhat between mild diarrhea and mild constipation, but is managing her  bowels well overall. She's had no change in urinary habits, no dysuria or hematuria. She currently has no cough or phlegm production, and denies any increased shortness of breath, chest pain, or palpitations. She's had no abnormal headaches or dizziness. Her vision is more blurred, especially when taking the dexamethasone. Currently, she denies any unusual myalgias, arthralgias, or bony pain.  A detailed review of systems is otherwise stable and noncontributory.   PAST MEDICAL HISTORY: Past Medical History  Diagnosis Date  . Breast cancer 06/03/13    left, 6:00 o'clock  . Thyroid disease     hypothyroidism  . Hypothyroidism   . Depression     PAST SURGICAL HISTORY: Past Surgical History  Procedure Laterality Date  . Tonsillectomy  1972  . Cesarean section  04/1999  . Breast biopsy  1/14    lt  . Dilation and curettage of uterus    . Colonoscopy  2013  . Breast lumpectomy with needle localization and axillary sentinel lymph node bx Left 07/26/2013    Procedure: BREAST LUMPECTOMY WITH NEEDLE LOCALIZATION AND AXILLARY SENTINEL LYMPH NODE BX;  Surgeon: Shann Medal, MD;  Location: [facility];  Service: General;  Laterality: Left;  Marland Kitchen Mole removal Right 07/26/2013    Procedure: MOLE REMOVAL UNDER RIGHT BREAST;  Surgeon: Shann Medal, MD;  Location: [facility];  Service: General;  Laterality: Right;  . Portacath placement Right 07/26/2013    Procedure: INSERTION PORT-A-CATH;  Surgeon: Shann Medal, MD;  Location: [facility];  Service: General;  Laterality: Right;    FAMILY HISTORY Family History  Problem Relation Age of Onset  . Stroke Mother   . Diabetes Father   . Cancer Maternal Grandmother 63    colon   the patient's father died at the age of 23 in a car accident. The patient's mother died at the age of 43 from a stroke. The patient has one adopted brother. She has one biological sister. The patient's mother's mother was diagnosed with  colon cancer at the age of 60. There is no history of breast Lori Vincent cancer in the family to the patient's knowledge.  GYNECOLOGIC HISTORY:   (Reviewed 11/04/2013) Menarche age 13. First live birth age 23. The patient is GX P1. She took birth control pills for approximately 12 years remotely. She had a period in February of 2014 and then in December of 2014. She had been started on hormone replacement in December. That has since been discontinued.  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 Her husband died at [place] 3 years ago with soft tissue sarcoma. The patient's significant other also works as a Geophysicist/field seismologist. At home it is just the patient and her daughter 62, 20 years old, who just finished middle school. The patient has 2 stepchildren from her earlier marriage. 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  ADVANCED DIRECTIVES: Not in place   HEALTH MAINTENANCE:  (Updated 11/04/2013) History  Substance Use Topics  . Smoking status: Never Smoker   . Smokeless tobacco: Never Used  . Alcohol Use: Yes     Comment: 5-7 glasses per week of  red wine     Colonoscopy: July 2014  PAP: November 2014  Bone density: November 2014  Lipid panel: Not on file  Allergies  Allergen Reactions  . Compazine [Prochlorperazine] Other (See Comments)    Extreme restlessness and mild extrapyramidal movement;  Patient tolerates Reglan with no problems    Current Outpatient Prescriptions  Medication Sig Dispense Refill  . cholestyramine light (PREVALITE) 4 G packet Take 1 packet (4 g total) by mouth 2 (two) times daily as needed (diarrhea).  42 packet  1  . citalopram (CELEXA) 20 MG tablet Take 1 tablet (20 mg total) by mouth daily.  30 tablet  5  . dexamethasone (DECADRON) 4 MG tablet 2 tabs by mouth with food twice daily on  day before and 3 days after each chemo dose  30 tablet  2  . levothyroxine (SYNTHROID, LEVOTHROID) 112 MCG tablet Take 112 mcg by mouth daily before breakfast. Take sat and sunday      . lidocaine-prilocaine (EMLA) cream Apply 1 application topically as needed. 1-2 hr before each port access  30 g  5  . LORazepam (ATIVAN) 0.5 MG tablet Take 0.5 mg by mouth 2 (two) times daily.      . metoCLOPramide (REGLAN) 10 MG tablet 1 tab by mouth with meals  x 3 days after each chemo, then 1 tab by mouth every 6 hrs if needed for nausea      . omeprazole (PRILOSEC) 40 MG capsule Take 1 capsule (40 mg total) by mouth daily.  30 capsule  4  . potassium chloride SA (K-DUR,KLOR-CON) 20 MEQ tablet Take 1 tablet (20 mEq total) by mouth daily.  30 tablet  2  . SYNTHROID 125 MCG tablet Take 125 mcg by mouth every morning. Monday-Friday-5 days a week      . HYDROcodone-acetaminophen (NORCO/VICODIN) 5-325 MG per tablet Take 1-2 tablets by mouth every 6 (six) hours as needed.  30 tablet  0  . ondansetron (ZOFRAN) 8 MG tablet 1 tab by mouth twice daily x 3 days after chemo, then 1 tab by mouth every 12 hrs if needed for nausea  30 tablet  2  . prochlorperazine (COMPAZINE) 10 MG tablet       . promethazine (PHENERGAN) 25 MG tablet Take 25 mg by mouth every 6 (six) hours as needed for nausea or vomiting. Pt taking at bedtime.       No current facility-administered medications for this visit.   Facility-Administered Medications Ordered in Other Visits  Medication Dose Route Frequency Provider Last Rate Last Dose  . sodium chloride 0.9 % injection 10 mL  10 mL Intracatheter PRN Theotis Burrow, PA-C   10 mL at 11/04/13 1444    OBJECTIVE: Middle-aged white woman who appears comfortable and is in no acute distress Filed Vitals:   11/04/13 0919  BP: 166/91  Pulse: 88  Temp: 98.4 F (36.9 C)  Resp: 18   Body mass index is 32.36 kg/(m^2).    ECOG FS: 1 Filed Weights   11/04/13 0919  Weight: 212 lb 12.8 oz (96.525 kg)    Physical Exam: HEENT:  Lori Vincent anicteric.  Lori Vincent clear and moist. No ulcerations or mucositis. No evidence of oropharyngeal candidiasis. Neck supple, trachea midline.  NODES:  No cervical or supraclavicular lymphadenopathy palpated.  BREAST EXAM:  Breast exam deferred. Axillae are benign bilaterally, with no palpable lymphadenopathy. LUNGS:  Clear to auscultation bilaterally with good excursion.  No wheezes or rhonchi HEART:  Regular rate and rhythm.  ABDOMEN:  Soft, nontender.  No organomegaly or masses palpated. Positive bowel sounds.  MSK:  No focal spinal tenderness to palpation. Good range of motion bilaterally in the upper extremities. EXTREMITIES:  Nonpitting pedal edema bilaterally, equal bilaterally. No lymphedema in the left upper extremity. SKIN:  No visible rash is today. The hands are benign, with no significant erythema, cracking, peeling, vesicles, or pustules noted. No excessive ecchymoses. No petechiae. No pallor. NEURO:  Nonfocal. Well oriented. Appropriate affect.   LAB RESULTS: CBC    Component Value Date/Time   WBC 7.5 11/04/2013 0910   RBC 3.69* 11/04/2013 0910   HGB 10.3* 11/04/2013 0910   HCT 32.2* 11/04/2013 0910   PLT 249 11/04/2013 0910   MCV 87.1 11/04/2013 0910   MCH 28.0 11/04/2013 0910   MCHC 32.1 11/04/2013 0910   RDW 18.9* 11/04/2013 0910   LYMPHSABS 0.6* 11/04/2013 0910   MONOABS 0.4 11/04/2013 0910   EOSABS 0.0 11/04/2013 0910   BASOSABS 0.0 11/04/2013 0910    CMP     Component Value Date/Time   NA 139 11/04/2013 0910   K 3.8 11/04/2013 0910   CO2 27 11/04/2013 0910   GLUCOSE 148* 11/04/2013 0910   BUN 15.2 11/04/2013 0910   CREATININE 0.7 11/04/2013 0910   CALCIUM 9.5 11/04/2013 0910   PROT 6.5 11/04/2013 0910   ALBUMIN 3.7 11/04/2013 0910   AST 16 11/04/2013 0910   ALT 23 11/04/2013 0910   ALKPHOS 92 11/04/2013 0910   BILITOT 0.49 11/04/2013 0910      STUDIES: No results found.  ASSESSMENT: 58 y.o. [place] woman, status post left breast  biopsy 06/03/2013 for a clinical T2 N0, stage IIA invasive ductal carcinoma, grade 3, estrogen and progesterone receptor positive, with an MIB-1 of 44%, and HER-2 amplified.  (1) status post left lumpectomy and sentinel lymph node sampling 07-2013 for a pT2 pN0, stage IIA invasive ductal carcinoma, grade 3, with negative margins.  (2) being treated in the adjuvant setting, the plan being to treat with 6 q. three-week doses of docetaxel/carboplatin (AUC 5) given along with trastuzumab/pertuzumab, first dose on 09/02/2013. Neulasta on day 2 for granulocyte support.  (3) trastuzumab and pertuzumab to continue to total one year  (4) adjuvant radiation to follow chemotherapy  (5) adjuvant anti-estrogens to follow radiation  (6)  Depression/anxiety - on Celexa, 20 mg   PLAN: The majority of our 25 minute appointment today was spent answering Allison's questions, addressing her concerns, discussing her side effects, reviewing her treatment plan, and coordinating care.  Lori Vincent will proceed to treatment today as scheduled for her day 1 cycle 4 of adjuvant chemotherapy. We are making no changes in her current regimen. She will continue with the same antinausea medications which she seems to be tolerating well. She will again utilize a "steroid tail" for an additional 3 days which she found very helpful.  I encouraged her once again to keep herself well hydrated. She also monitor herself very closely for increased signs of peripheral neuropathy.  We will continue to follow Lori Vincent closely, and I will see her again next week on June 18 for assessment of chemotoxicity on day 8 cycle 4. Again, the plan is to complete a total of 6 adjuvant cycles of chemotherapy, followed by adjuvant radiation. We will plan on continuing at least the trastuzumab every 3 weeks for total of one year, and will discuss further whether or not to continue or discontinue the pertuzumab after these first 6 cycles.  Lori Vincent voices her  understanding and agreement with the above plan. As always, she knows to call with any changes or problems.   Secret Kristensen, PA-C   11/04/2013 4:53 PM

## 2013-11-05 ENCOUNTER — Telehealth: Payer: Self-pay | Admitting: *Deleted

## 2013-11-05 ENCOUNTER — Ambulatory Visit: Payer: BC Managed Care – PPO

## 2013-11-05 ENCOUNTER — Ambulatory Visit (HOSPITAL_BASED_OUTPATIENT_CLINIC_OR_DEPARTMENT_OTHER): Payer: BC Managed Care – PPO

## 2013-11-05 ENCOUNTER — Telehealth: Payer: Self-pay | Admitting: Physician Assistant

## 2013-11-05 VITALS — BP 127/72 | HR 92 | Temp 98.3°F

## 2013-11-05 DIAGNOSIS — C50919 Malignant neoplasm of unspecified site of unspecified female breast: Secondary | ICD-10-CM

## 2013-11-05 DIAGNOSIS — Z5189 Encounter for other specified aftercare: Secondary | ICD-10-CM

## 2013-11-05 DIAGNOSIS — C50512 Malignant neoplasm of lower-outer quadrant of left female breast: Secondary | ICD-10-CM

## 2013-11-05 MED ORDER — PEGFILGRASTIM INJECTION 6 MG/0.6ML
6.0000 mg | Freq: Once | SUBCUTANEOUS | Status: AC
  Administered 2013-11-05: 6 mg via SUBCUTANEOUS
  Filled 2013-11-05: qty 0.6

## 2013-11-05 NOTE — Telephone Encounter (Signed)
pt cld stated she overslept and would liketo come for the inj-adv 1:30 opening-pt wanted appt-cld J[facility] inj nurse to adv stated ok-adv pt-pt will be here for 1;30 appt-pt understood

## 2013-11-05 NOTE — Telephone Encounter (Signed)
Called patient about missed injection appointment.  Left message to call back and reschedule.

## 2013-11-11 ENCOUNTER — Other Ambulatory Visit: Payer: Self-pay | Admitting: Physician Assistant

## 2013-11-11 ENCOUNTER — Other Ambulatory Visit: Payer: BC Managed Care – PPO

## 2013-11-11 ENCOUNTER — Ambulatory Visit: Payer: BC Managed Care – PPO | Admitting: Physician Assistant

## 2013-11-11 DIAGNOSIS — C50512 Malignant neoplasm of lower-outer quadrant of left female breast: Secondary | ICD-10-CM

## 2013-11-15 ENCOUNTER — Ambulatory Visit (HOSPITAL_BASED_OUTPATIENT_CLINIC_OR_DEPARTMENT_OTHER): Payer: BC Managed Care – PPO | Admitting: Physician Assistant

## 2013-11-15 ENCOUNTER — Other Ambulatory Visit (HOSPITAL_BASED_OUTPATIENT_CLINIC_OR_DEPARTMENT_OTHER): Payer: BC Managed Care – PPO

## 2013-11-15 ENCOUNTER — Telehealth: Payer: Self-pay | Admitting: Physician Assistant

## 2013-11-15 ENCOUNTER — Encounter: Payer: Self-pay | Admitting: Physician Assistant

## 2013-11-15 VITALS — BP 134/80 | HR 99 | Temp 98.0°F | Resp 20 | Ht 68.0 in | Wt 206.6 lb

## 2013-11-15 DIAGNOSIS — G609 Hereditary and idiopathic neuropathy, unspecified: Secondary | ICD-10-CM

## 2013-11-15 DIAGNOSIS — C50919 Malignant neoplasm of unspecified site of unspecified female breast: Secondary | ICD-10-CM

## 2013-11-15 DIAGNOSIS — R5381 Other malaise: Secondary | ICD-10-CM

## 2013-11-15 DIAGNOSIS — K649 Unspecified hemorrhoids: Secondary | ICD-10-CM | POA: Insufficient documentation

## 2013-11-15 DIAGNOSIS — E876 Hypokalemia: Secondary | ICD-10-CM

## 2013-11-15 DIAGNOSIS — Z170 Estrogen receptor positive status [ER+]: Secondary | ICD-10-CM

## 2013-11-15 DIAGNOSIS — C50512 Malignant neoplasm of lower-outer quadrant of left female breast: Secondary | ICD-10-CM

## 2013-11-15 DIAGNOSIS — R5383 Other fatigue: Secondary | ICD-10-CM

## 2013-11-15 DIAGNOSIS — R197 Diarrhea, unspecified: Secondary | ICD-10-CM

## 2013-11-15 DIAGNOSIS — F341 Dysthymic disorder: Secondary | ICD-10-CM

## 2013-11-15 LAB — CBC WITH DIFFERENTIAL/PLATELET
BASO%: 0.4 % (ref 0.0–2.0)
BASOS ABS: 0 10*3/uL (ref 0.0–0.1)
EOS ABS: 0 10*3/uL (ref 0.0–0.5)
EOS%: 0 % (ref 0.0–7.0)
HEMATOCRIT: 33.5 % — AB (ref 34.8–46.6)
HEMOGLOBIN: 10.8 g/dL — AB (ref 11.6–15.9)
LYMPH%: 21.5 % (ref 14.0–49.7)
MCH: 28.4 pg (ref 25.1–34.0)
MCHC: 32.3 g/dL (ref 31.5–36.0)
MCV: 88.1 fL (ref 79.5–101.0)
MONO#: 0.6 10*3/uL (ref 0.1–0.9)
MONO%: 6.2 % (ref 0.0–14.0)
NEUT%: 71.9 % (ref 38.4–76.8)
NEUTROS ABS: 7.2 10*3/uL — AB (ref 1.5–6.5)
PLATELETS: 153 10*3/uL (ref 145–400)
RBC: 3.8 10*6/uL (ref 3.70–5.45)
RDW: 21 % — ABNORMAL HIGH (ref 11.2–14.5)
WBC: 10 10*3/uL (ref 3.9–10.3)
lymph#: 2.1 10*3/uL (ref 0.9–3.3)

## 2013-11-15 LAB — COMPREHENSIVE METABOLIC PANEL (CC13)
ALBUMIN: 3.7 g/dL (ref 3.5–5.0)
ALK PHOS: 111 U/L (ref 40–150)
ALT: 21 U/L (ref 0–55)
AST: 17 U/L (ref 5–34)
Anion Gap: 8 mEq/L (ref 3–11)
BUN: 9.6 mg/dL (ref 7.0–26.0)
CALCIUM: 9.1 mg/dL (ref 8.4–10.4)
CO2: 26 mEq/L (ref 22–29)
Chloride: 105 mEq/L (ref 98–109)
Creatinine: 0.8 mg/dL (ref 0.6–1.1)
GLUCOSE: 125 mg/dL (ref 70–140)
Potassium: 3.4 mEq/L — ABNORMAL LOW (ref 3.5–5.1)
Sodium: 138 mEq/L (ref 136–145)
Total Bilirubin: 0.46 mg/dL (ref 0.20–1.20)
Total Protein: 6.2 g/dL — ABNORMAL LOW (ref 6.4–8.3)

## 2013-11-15 LAB — MAGNESIUM (CC13): MAGNESIUM: 2.2 mg/dL (ref 1.5–2.5)

## 2013-11-15 MED ORDER — HYDROCORTISONE ACETATE 25 MG RE SUPP: 25.0000 mg | Freq: Two times a day (BID) | RECTAL | Status: DC

## 2013-11-15 NOTE — Telephone Encounter (Signed)
per pof to sch pt appt-sent emailt o MW to sch trmts-will call pt w/sch once MW reply

## 2013-11-15 NOTE — Progress Notes (Signed)
[place]  [phone]) [phone] [phone]) [phone]     ID: [name] OB: [birth year]  MR#: [id number]  KTG#:[id number]  PCP: [name], NP GYN:  [name], MD SU: [name], MD OTHER MD: Arloa Koh, MD  CHIEF COMPLAINT: Left breast cancer/adjuvant chemotherapy   HISTORY OF PRESENT ILLNESS: Lori Vincent [facility] showing a possible mass in the left breast. Diagnostic left mammography and ultrasonography at the breast center 04/26/2013 confirmed an oval mass with microcalcifications in the 6:00 position of the left breast. There was an adjacent partially obscured 1.2 cm mass. On physical exam there was no palpable mass in the left breast. By ultrasonography, there was an oval mass at the 6:00 position measuring 1.4 cm, with a second mass measuring 1.1 cm. There were no abnormal lymph nodes noted in the left axilla.  Biopsy of the 6:00 mass in the left breast January 2015 showed ([id number]) and invasive ductal carcinoma, grade 3, estrogen receptor 97% positive, progesterone receptor 75% positive, with strong staining intensity, and MIB-144%. HER-2 was amplified, with the signals ratio 4.18 and in number per cell of 9.40 .  On 06/08/2013 the patient underwent bilateral breast MRI which showed a dumbbell-shaped area in the central left breast consisting of a 2 cm mass and 2 extensions or an adjacent masses, measuring a total of 2.9 cm. There were no abnormal appearing lymph nodes for any other areas of concern.  The patient's subsequent history is as detailed below.   INTERVAL HISTORY: Lori Vincent returns alone today for followup of her left breast cancer. She is currently day 12 cycle 4 of 6 planned q. three-week doses of docetaxel/carboplatin today, given along with trastuzumab/pertuzumab in the adjuvant setting. She receives Neulasta on day 2 for granulocyte support.  She does take a taper of dexamethasone  following treatment, and this did seem to help significantly with this last cycle. She has had a history of a rash on the dorsum surfaces of her hands bilaterally following treatment, but notes that the rash was much less severe with cycle 4 than it was with cycle 3. Again, there has been no cracking, peeling or significant pain. There was some itching and dryness, but this is improving.  Lori Vincent has had no additional swelling in her feet and ankles. She continues to have some tingling intermittently in the tips of her toes. She feels like this worsens slightly immediately following treatment, then "fades a little" towards the end of this cycle before coming back with the next infusion. Overall, she feels like it is stable. Is not affecting her ability to walk, and is affecting none of her day-to-day activities. Her nails are sensitive, but she denies any actual neuropathy in the upper extremities.  REVIEW OF SYSTEMS: Lori Vincent has had no fevers, chills, or night sweats. She continues to have moderate fatigue, although her fatigue level certainly worsened after coming off the dexamethasone. She has had no abnormal bruising or bleeding. She has some taste aversion, but no problems with nausea or emesis. She still alternates somewhat between mild constipation and diarrhea. She had some slight constipation last week and has some resulting hemorrhoids. These are uncomfortable, but fortunately she has had no rectal bleeding. As with previous cycles, she has now started having occasional loose stools which she is treating effectively with Imodium. She has had only one loose stool today. There has been no blood or mucus in the stool. Her "cold" has almost resolved, with  only slight residual cough which is nonproductive. She denies any increased shortness of breath, chest pain, or palpitations. She's had no abnormal headaches or dizziness. Her vision continues to be blurry, especially when taking the dexamethasone.  Currently, she denies any unusual myalgias, arthralgias, or bony pain.  A detailed review of systems is otherwise stable and noncontributory.   PAST MEDICAL HISTORY: Past Medical History  Diagnosis Date  . Breast cancer 06/03/13    left, 6:00 o'clock  . Thyroid disease     hypothyroidism  . Hypothyroidism   . Depression     PAST SURGICAL HISTORY: Past Surgical History  Procedure Laterality Date  . Tonsillectomy  1972  . Cesarean section  04/1999  . Breast biopsy  1/14    lt  . Dilation and curettage of uterus    . Colonoscopy  2013  . Breast lumpectomy with needle localization and axillary sentinel lymph node bx Left 07/26/2013    Procedure: BREAST LUMPECTOMY WITH NEEDLE LOCALIZATION AND AXILLARY SENTINEL LYMPH NODE BX;  Surgeon: Shann Medal, MD;  Location: [place];  Service: General;  Laterality: Left;  Marland Kitchen Mole removal Right 07/26/2013    Procedure: MOLE REMOVAL UNDER RIGHT BREAST;  Surgeon: Shann Medal, MD;  Location: [place];  Service: General;  Laterality: Right;  . Portacath placement Right 07/26/2013    Procedure: INSERTION PORT-A-CATH;  Surgeon: Shann Medal, MD;  Location: [place];  Service: General;  Laterality: Right;    FAMILY HISTORY Family History  Problem Relation Age of Onset  . Stroke Mother   . Diabetes Father   . Cancer Maternal Grandmother 38    colon   the patient's father died at the age of 78 in a car accident. The patient's mother died at the age of 75 from a stroke. The patient has one adopted brother. She has one biological sister. The patient's mother's mother was diagnosed with colon cancer at the age of 55. There is no history of breast oropharynx cancer in the family to the patient's knowledge.  GYNECOLOGIC HISTORY:   (Reviewed 11/15/2013) Menarche age 28. First live birth age 81. The patient is GX P1. She took birth control pills for approximately 12 years remotely. She had a period in  February of 2014 and then in December of 2014. She had been started on hormone replacement in December. That has since been discontinued.  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 Her husband died at [employer] 3 years ago with soft tissue sarcoma. The patient's significant other also works as a Geophysicist/field seismologist. At home it is just the patient and her daughter 79, 28 years old, who just finished middle school. The patient has 2 stepchildren from her earlier marriage. 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  ADVANCED DIRECTIVES: Not in place   HEALTH MAINTENANCE:  (Updated 11/15/2013) History  Substance Use Topics  . Smoking status: Never Smoker   . Smokeless tobacco: Never Used  . Alcohol Use: Yes     Comment: 5-7 glasses per week of red wine     Colonoscopy: July 2014  PAP: November 2014  Bone density: November 2014  Lipid panel: Not on file  Allergies  Allergen Reactions  . Compazine [Prochlorperazine] Other (See Comments)    Extreme restlessness and mild extrapyramidal movement;  Patient tolerates Reglan with no problems    Current Outpatient Prescriptions  Medication Sig Dispense Refill  . citalopram (CELEXA) 20 MG tablet Take 1 tablet (20 mg total) by mouth daily.  30 tablet  5  . levothyroxine (SYNTHROID, LEVOTHROID) 112 MCG tablet Take 112 mcg by mouth daily before breakfast. Take sat and sunday      . LORazepam (ATIVAN) 0.5 MG tablet Take 0.5 mg by mouth 2 (two) times daily.      . metoCLOPramide (REGLAN) 10 MG tablet 1 tab by mouth with meals  x 3 days after each chemo, then 1 tab by mouth every 6 hrs if needed for nausea      . omeprazole (PRILOSEC) 40 MG capsule Take 1 capsule (40 mg total) by mouth daily.  30 capsule  4  . potassium chloride SA (K-DUR,KLOR-CON) 20 MEQ tablet Take 1 tablet (20 mEq  total) by mouth daily.  30 tablet  2  . SYNTHROID 125 MCG tablet Take 125 mcg by mouth every morning. Monday-Friday-5 days a week      . cholestyramine light (PREVALITE) 4 G packet Take 1 packet (4 g total) by mouth 2 (two) times daily as needed (diarrhea).  42 packet  1  . dexamethasone (DECADRON) 4 MG tablet 2 tabs by mouth with food twice daily on day before and 3 days after each chemo dose  30 tablet  2  . HYDROcodone-acetaminophen (NORCO/VICODIN) 5-325 MG per tablet Take 1-2 tablets by mouth every 6 (six) hours as needed.  30 tablet  0  . hydrocortisone (ANUSOL-HC) 25 MG suppository Place 1 suppository (25 mg total) rectally 2 (two) times daily.  12 suppository  1  . lidocaine-prilocaine (EMLA) cream Apply 1 application topically as needed. 1-2 hr before each port access  30 g  5  . ondansetron (ZOFRAN) 8 MG tablet 1 tab by mouth twice daily x 3 days after chemo, then 1 tab by mouth every 12 hrs if needed for nausea  30 tablet  2  . promethazine (PHENERGAN) 25 MG tablet Take 25 mg by mouth every 6 (six) hours as needed for nausea or vomiting. Pt taking at bedtime.       No current facility-administered medications for this visit.    OBJECTIVE: Middle-aged white woman who appears tired but is in no acute distress Filed Vitals:   11/15/13 1452  BP: 134/80  Pulse: 99  Temp: 98 F (36.7 C)  Resp: 20   Body mass index is 31.42 kg/(m^2).    ECOG FS: 1 Filed Weights   11/15/13 1452  Weight: 206 lb 9.6 oz (93.713 kg)   Physical Exam: HEENT:  Sclerae anicteric.  Oropharynx clear and moist. No ulcerationsNo evidence of mucositis or oropharyngeal candidiasis. Neck supple, trachea midline.  NODES:  No cervical or supraclavicular lymphadenopathy palpated.  BREAST EXAM:  Breast exam deferred. Axillae are benign bilaterally, with no palpable lymphadenopathy. LUNGS:  Clear to auscultation bilaterally with good excursion.  No crackles, wheezes or rhonchi HEART:  Regular rate and rhythm. No murmur  appreciated. ABDOMEN:  Soft, nondistended, nontender.  No organomegaly or masses palpated. Positive bowel sounds.  MSK:  No focal spinal tenderness to palpation. Good range of motion bilaterally in the upper extremities. EXTREMITIES:  No pedal edema. No lymphedema in the left upper extremity. SKIN:  No visible rash is today, although there is some slight hyperpigmentation and dryness of the skin bilaterally on the dorsum surfaces of the hands. There is no cracking or peeling,  no vesicles or pustules.  No visible nail dyscrasia. No excessive ecchymoses. No petechiae. No pallor. NEURO:  Nonfocal. Well oriented. Appropriate affect.   LAB RESULTS: CBC    Component Value Date/Time   WBC 10.0 11/15/2013 1431   RBC 3.80 11/15/2013 1431   HGB 10.8* 11/15/2013 1431   HCT 33.5* 11/15/2013 1431   PLT 153 11/15/2013 1431   MCV 88.1 11/15/2013 1431   MCH 28.4 11/15/2013 1431   MCHC 32.3 11/15/2013 1431   RDW 21.0* 11/15/2013 1431   LYMPHSABS 2.1 11/15/2013 1431   MONOABS 0.6 11/15/2013 1431   EOSABS 0.0 11/15/2013 1431   BASOSABS 0.0 11/15/2013 1431    CMP     Component Value Date/Time   NA 138 11/15/2013 1431   K 3.4* 11/15/2013 1431   CO2 26 11/15/2013 1431   GLUCOSE 125 11/15/2013 1431   BUN 9.6 11/15/2013 1431   CREATININE 0.8 11/15/2013 1431   CALCIUM 9.1 11/15/2013 1431   PROT 6.2* 11/15/2013 1431   ALBUMIN 3.7 11/15/2013 1431   AST 17 11/15/2013 1431   ALT 21 11/15/2013 1431   ALKPHOS 111 11/15/2013 1431   BILITOT 0.46 11/15/2013 1431      STUDIES: No results found.    ASSESSMENT: 58 y.o. [place] woman, status post left breast biopsy 06/03/2013 for a clinical T2 N0, stage IIA invasive ductal carcinoma, grade 3, estrogen and progesterone receptor positive, with an MIB-1 of 44%, and HER-2 amplified.  (1) status post left lumpectomy and sentinel lymph node sampling 07-2013 for a pT2 pN0, stage IIA invasive ductal carcinoma, grade 3, with negative margins.  (2) being treated in the adjuvant  setting, the plan being to treat with 6 q. three-week doses of docetaxel/carboplatin (AUC 5) given along with trastuzumab/pertuzumab, first dose on 09/02/2013. Neulasta on day 2 for granulocyte support.  (3) trastuzumab and pertuzumab to continue to total one year  (4) adjuvant radiation to follow chemotherapy  (5) adjuvant anti-estrogens to follow radiation  (6)  Depression/anxiety - on Celexa, 20 mg   PLAN: Lori Vincent continues to tolerate this regimen reasonably well. Her biggest concern at this point would be her continued peripheral neuropathy, currently affecting on her lower extremities. This will need to be followed very closely in the future, and should it worsen, we may need to hold the docetaxel and reassess.   Otherwise, I making no changes to her current regimen. She will return next week for day 1 cycle 5 of adjuvant chemotherapy on July 2. She will continue with the same antinausea medications which she seems to be tolerating well. She will again utilize a "steroid tail" for an additional 3 days which she has found very helpful. She will monitor herself very closely for increased signs of peripheral neuropathy.  The overall plan is to complete a total of 6 adjuvant cycles of chemotherapy, followed by adjuvant radiation. We will plan on continuing at least the trastuzumab every 3 weeks for total of one year, and will discuss further whether or not to continue or discontinue the pertuzumab after these first 6 cycles.  I have prescribed Anusol-HC suppositories for Lori Vincent's hemorrhoidal discomfort. Should she have any increased discomfort or experience any significant rectal bleeding, she knows to call and let us know.  Lori Vincent voices her understanding and agreement with the above plan. As always, she knows to call with any changes or problems.   Micah Flesher, PA-C   11/15/2013 4:49 PM

## 2013-11-16 ENCOUNTER — Other Ambulatory Visit: Payer: Self-pay | Admitting: Physician Assistant

## 2013-11-16 ENCOUNTER — Telehealth: Payer: Self-pay | Admitting: *Deleted

## 2013-11-16 NOTE — Telephone Encounter (Signed)
Per staff message and POF I have scheduled appts. Advised scheduler of appts. JMW  

## 2013-11-17 ENCOUNTER — Telehealth: Payer: Self-pay | Admitting: Oncology

## 2013-11-17 NOTE — Telephone Encounter (Signed)
cld & spoke to pt in re to trmt appts-pt stated has MY CHART and will look at updated sch

## 2013-11-19 ENCOUNTER — Encounter: Payer: Self-pay | Admitting: Oncology

## 2013-11-19 NOTE — Progress Notes (Signed)
Insurance is paying 100%  [id number]

## 2013-11-25 ENCOUNTER — Ambulatory Visit: Payer: BC Managed Care – PPO | Admitting: Physician Assistant

## 2013-11-25 ENCOUNTER — Other Ambulatory Visit: Payer: BC Managed Care – PPO

## 2013-11-25 ENCOUNTER — Other Ambulatory Visit (HOSPITAL_BASED_OUTPATIENT_CLINIC_OR_DEPARTMENT_OTHER): Payer: BC Managed Care – PPO

## 2013-11-25 ENCOUNTER — Encounter: Payer: Self-pay | Admitting: Physician Assistant

## 2013-11-25 ENCOUNTER — Ambulatory Visit (HOSPITAL_BASED_OUTPATIENT_CLINIC_OR_DEPARTMENT_OTHER): Payer: BC Managed Care – PPO | Admitting: Physician Assistant

## 2013-11-25 ENCOUNTER — Ambulatory Visit (HOSPITAL_BASED_OUTPATIENT_CLINIC_OR_DEPARTMENT_OTHER): Payer: BC Managed Care – PPO

## 2013-11-25 VITALS — BP 144/88 | HR 85 | Temp 98.1°F | Resp 20 | Ht 68.0 in | Wt 215.6 lb

## 2013-11-25 DIAGNOSIS — Z5111 Encounter for antineoplastic chemotherapy: Secondary | ICD-10-CM

## 2013-11-25 DIAGNOSIS — Z5112 Encounter for antineoplastic immunotherapy: Secondary | ICD-10-CM

## 2013-11-25 DIAGNOSIS — C50919 Malignant neoplasm of unspecified site of unspecified female breast: Secondary | ICD-10-CM

## 2013-11-25 DIAGNOSIS — F341 Dysthymic disorder: Secondary | ICD-10-CM

## 2013-11-25 DIAGNOSIS — C50512 Malignant neoplasm of lower-outer quadrant of left female breast: Secondary | ICD-10-CM

## 2013-11-25 DIAGNOSIS — G609 Hereditary and idiopathic neuropathy, unspecified: Secondary | ICD-10-CM

## 2013-11-25 DIAGNOSIS — Z170 Estrogen receptor positive status [ER+]: Secondary | ICD-10-CM

## 2013-11-25 LAB — COMPREHENSIVE METABOLIC PANEL (CC13)
ALT: 20 U/L (ref 0–55)
AST: 16 U/L (ref 5–34)
Albumin: 3.5 g/dL (ref 3.5–5.0)
Alkaline Phosphatase: 87 U/L (ref 40–150)
Anion Gap: 11 mEq/L (ref 3–11)
BUN: 10 mg/dL (ref 7.0–26.0)
CO2: 25 mEq/L (ref 22–29)
Calcium: 9.3 mg/dL (ref 8.4–10.4)
Chloride: 105 mEq/L (ref 98–109)
Creatinine: 0.7 mg/dL (ref 0.6–1.1)
GLUCOSE: 137 mg/dL (ref 70–140)
Potassium: 3.7 mEq/L (ref 3.5–5.1)
Sodium: 140 mEq/L (ref 136–145)
TOTAL PROTEIN: 6.2 g/dL — AB (ref 6.4–8.3)
Total Bilirubin: 0.43 mg/dL (ref 0.20–1.20)

## 2013-11-25 LAB — CBC WITH DIFFERENTIAL/PLATELET
BASO%: 0 % (ref 0.0–2.0)
Basophils Absolute: 0 10*3/uL (ref 0.0–0.1)
EOS ABS: 0 10*3/uL (ref 0.0–0.5)
EOS%: 0 % (ref 0.0–7.0)
HCT: 29.9 % — ABNORMAL LOW (ref 34.8–46.6)
HGB: 9.3 g/dL — ABNORMAL LOW (ref 11.6–15.9)
LYMPH%: 11.7 % — ABNORMAL LOW (ref 14.0–49.7)
MCH: 28.7 pg (ref 25.1–34.0)
MCHC: 31.1 g/dL — ABNORMAL LOW (ref 31.5–36.0)
MCV: 92.3 fL (ref 79.5–101.0)
MONO#: 0.5 10*3/uL (ref 0.1–0.9)
MONO%: 8.6 % (ref 0.0–14.0)
NEUT%: 79.7 % — ABNORMAL HIGH (ref 38.4–76.8)
NEUTROS ABS: 5 10*3/uL (ref 1.5–6.5)
Platelets: 197 10*3/uL (ref 145–400)
RBC: 3.24 10*6/uL — ABNORMAL LOW (ref 3.70–5.45)
RDW: 21.9 % — AB (ref 11.2–14.5)
WBC: 6.3 10*3/uL (ref 3.9–10.3)
lymph#: 0.7 10*3/uL — ABNORMAL LOW (ref 0.9–3.3)

## 2013-11-25 LAB — MAGNESIUM (CC13): MAGNESIUM: 2 mg/dL (ref 1.5–2.5)

## 2013-11-25 MED ORDER — DIPHENHYDRAMINE HCL 25 MG PO CAPS
ORAL_CAPSULE | ORAL | Status: AC
  Filled 2013-11-25: qty 2

## 2013-11-25 MED ORDER — DOCETAXEL CHEMO INJECTION 160 MG/16ML
75.0000 mg/m2 | Freq: Once | INTRAVENOUS | Status: AC
  Administered 2013-11-25: 170 mg via INTRAVENOUS
  Filled 2013-11-25: qty 17

## 2013-11-25 MED ORDER — ONDANSETRON 16 MG/50ML IVPB (CHCC)
INTRAVENOUS | Status: AC
  Filled 2013-11-25: qty 16

## 2013-11-25 MED ORDER — HEPARIN SOD (PORK) LOCK FLUSH 100 UNIT/ML IV SOLN
500.0000 [IU] | Freq: Once | INTRAVENOUS | Status: AC | PRN
  Administered 2013-11-25: 500 [IU]
  Filled 2013-11-25: qty 5

## 2013-11-25 MED ORDER — SODIUM CHLORIDE 0.9 % IV SOLN
750.0000 mg | Freq: Once | INTRAVENOUS | Status: AC
  Administered 2013-11-25: 750 mg via INTRAVENOUS
  Filled 2013-11-25: qty 75

## 2013-11-25 MED ORDER — ACETAMINOPHEN 325 MG PO TABS
650.0000 mg | ORAL_TABLET | Freq: Once | ORAL | Status: AC
  Administered 2013-11-25: 650 mg via ORAL

## 2013-11-25 MED ORDER — SODIUM CHLORIDE 0.9 % IJ SOLN
10.0000 mL | INTRAMUSCULAR | Status: DC | PRN
  Administered 2013-11-25: 10 mL
  Filled 2013-11-25: qty 10

## 2013-11-25 MED ORDER — SODIUM CHLORIDE 0.9 % IV SOLN
420.0000 mg | Freq: Once | INTRAVENOUS | Status: AC
  Administered 2013-11-25: 420 mg via INTRAVENOUS
  Filled 2013-11-25: qty 14

## 2013-11-25 MED ORDER — SODIUM CHLORIDE 0.9 % IV SOLN
Freq: Once | INTRAVENOUS | Status: AC
  Administered 2013-11-25: 10:00:00 via INTRAVENOUS

## 2013-11-25 MED ORDER — TRASTUZUMAB CHEMO INJECTION 440 MG
6.0000 mg/kg | Freq: Once | INTRAVENOUS | Status: AC
  Administered 2013-11-25: 588 mg via INTRAVENOUS
  Filled 2013-11-25: qty 28

## 2013-11-25 MED ORDER — DIPHENHYDRAMINE HCL 25 MG PO CAPS
50.0000 mg | ORAL_CAPSULE | Freq: Once | ORAL | Status: AC
  Administered 2013-11-25: 50 mg via ORAL

## 2013-11-25 MED ORDER — ONDANSETRON 16 MG/50ML IVPB (CHCC)
16.0000 mg | Freq: Once | INTRAVENOUS | Status: AC
  Administered 2013-11-25: 16 mg via INTRAVENOUS

## 2013-11-25 MED ORDER — DEXAMETHASONE SODIUM PHOSPHATE 20 MG/5ML IJ SOLN
20.0000 mg | Freq: Once | INTRAMUSCULAR | Status: AC
  Administered 2013-11-25: 20 mg via INTRAVENOUS

## 2013-11-25 MED ORDER — ACETAMINOPHEN 325 MG PO TABS
ORAL_TABLET | ORAL | Status: AC
  Filled 2013-11-25: qty 2

## 2013-11-25 MED ORDER — DEXAMETHASONE SODIUM PHOSPHATE 20 MG/5ML IJ SOLN
INTRAMUSCULAR | Status: AC
  Filled 2013-11-25: qty 5

## 2013-11-25 NOTE — Progress Notes (Addendum)
[place]  [phone] [phone]     ID: Lori Vincent OB: [birth year]  MR#: [id number]  YJE#:[id number]  PCP: [name], NP GYN:  [name], MD SU: [name], MD OTHER MD: Arloa Koh, MD  CHIEF COMPLAINT: Left breast cancer/adjuvant chemotherapy   HISTORY OF PRESENT ILLNESS: Lori Vincent [facility] showing a possible mass in the left breast. Diagnostic left mammography and ultrasonography at the breast center 04/26/2013 confirmed an oval mass with microcalcifications in the 6:00 position of the left breast. There was an adjacent partially obscured 1.2 cm mass. On physical exam there was no palpable mass in the left breast. By ultrasonography, there was an oval mass at the 6:00 position measuring 1.4 cm, with a second mass measuring 1.1 cm. There were no abnormal lymph nodes noted in the left axilla.  Biopsy of the 6:00 mass in the left breast January 2015 showed ([id number]) and invasive ductal carcinoma, grade 3, estrogen receptor 97% positive, progesterone receptor 75% positive, with strong staining intensity, and MIB-144%. HER-2 was amplified, with the signals ratio 4.18 and in number per cell of 9.40 .  On 06/08/2013 the patient underwent bilateral breast MRI which showed a dumbbell-shaped area in the central left breast consisting of a 2 cm mass and 2 extensions or an adjacent masses, measuring a total of 2.9 cm. There were no abnormal appearing lymph nodes for any other areas of concern.  The patient's subsequent history is as detailed below.   INTERVAL HISTORY: Lori Vincent returns alone today for followup of her left breast cancer. She is currently day 12 cycle 4 of 6 planned q. three-week doses of docetaxel/carboplatin today, given along with trastuzumab/pertuzumab in the adjuvant setting. She receives Neulasta on day 2 for granulocyte support.  She does take a taper of dexamethasone  following treatment, and this continued she reports some diarrhea that lasted about a week after chemotherapy. She has some seasonal sinus problems that she is treating with Gwenlyn Perking with reasonable results. She complains of that you're take it area on her scalp. Her hair has been falling out. She also notes some mild lower extremity edema. Review of her by mouth intake of fluid reveals that she could benefit from increasing her by mouth fluid intake.She continues to have some tingling intermittently in the tips of her toes. She feels like this worsens slightly immediately following treatment, then "fades a little" towards the end of this cycle before coming back with the next infusion. Overall, she feels like it is stable. Is not affecting her ability to walk, and is affecting none of her day-to-day activities. Her nails are sensitive, but she denies any actual neuropathy in the upper extremities.  REVIEW OF SYSTEMS: Lori Vincent has had no fevers, chills, or night sweats. She continues to have moderate fatigue, although her fatigue level certainly worsened after coming off the dexamethasone. She has had no abnormal bruising or bleeding. She has some taste aversion, but no problems with nausea or emesis. She still alternates somewhat between mild constipation and diarrhea. She had some slight constipation last week and has some resulting hemorrhoids. These are uncomfortable, but fortunately she has had no rectal bleeding. As with previous cycles, she has now started having occasional loose stools which she is treating effectively with Imodium.  There has been no blood or mucus in the stool. She denies any increased shortness of breath, chest pain, or palpitations. She's had no abnormal headaches or dizziness. Her  vision continues to be blurry, especially when taking the dexamethasone. Currently, she denies any unusual myalgias, arthralgias, or bony pain.  A detailed review of systems is otherwise stable and  noncontributory.   PAST MEDICAL HISTORY: Past Medical History  Diagnosis Date  . Breast cancer 06/03/13    left, 6:00 o'clock  . Thyroid disease     hypothyroidism  . Hypothyroidism   . Depression     PAST SURGICAL HISTORY: Past Surgical History  Procedure Laterality Date  . Tonsillectomy  1972  . Cesarean section  04/1999  . Breast biopsy  1/14    lt  . Dilation and curettage of uterus    . Colonoscopy  2013  . Breast lumpectomy with needle localization and axillary sentinel lymph node bx Left 07/26/2013    Procedure: BREAST LUMPECTOMY WITH NEEDLE LOCALIZATION AND AXILLARY SENTINEL LYMPH NODE BX;  Surgeon: Shann Medal, MD;  Location: [name];  Service: General;  Laterality: Left;  Marland Kitchen Mole removal Right 07/26/2013    Procedure: MOLE REMOVAL UNDER RIGHT BREAST;  Surgeon: Shann Medal, MD;  Location: [facility];  Service: General;  Laterality: Right;  . Portacath placement Right 07/26/2013    Procedure: INSERTION PORT-A-CATH;  Surgeon: Shann Medal, MD;  Location: [facility];  Service: General;  Laterality: Right;    FAMILY HISTORY Family History  Problem Relation Age of Onset  . Stroke Mother   . Diabetes Father   . Cancer Maternal Grandmother 11    colon   the patient's father died at the age of 64 in a car accident. The patient's mother died at the age of 11 from a stroke. The patient has one adopted brother. She has one biological sister. The patient's mother's mother was diagnosed with colon cancer at the age of 19. There is no history of breast oropharynx cancer in the family to the patient's knowledge.  GYNECOLOGIC HISTORY:   (Reviewed 11/15/2013) Menarche age 18. First live birth age 45. The patient is GX P1. She took birth control pills for approximately 12 years remotely. She had a period in February of 2014 and then in December of 2014. She had been started on hormone replacement in December. That has since been  discontinued.  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 Her husband died at [place] 3 years ago with soft tissue sarcoma. The patient's significant other also works as a Geophysicist/field seismologist. At home it is just the patient and her daughter 3, 48 years old, who just finished middle school. The patient has 2 stepchildren from her earlier marriage. 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  ADVANCED DIRECTIVES: Not in place   HEALTH MAINTENANCE:  (Updated 11/15/2013) History  Substance Use Topics  . Smoking status: Never Smoker   . Smokeless tobacco: Never Used  . Alcohol Use: Yes     Comment: 5-7 glasses per week of red wine     Colonoscopy: July 2014  PAP: November 2014  Bone density: November 2014  Lipid panel: Not on file  Allergies  Allergen Reactions  . Compazine [Prochlorperazine] Other (See Comments)    Extreme restlessness and mild extrapyramidal movement;  Patient tolerates Reglan with no problems    Current Outpatient Prescriptions  Medication Sig Dispense Refill  . cholestyramine light (PREVALITE) 4 G packet Take  1 packet (4 g total) by mouth 2 (two) times daily as needed (diarrhea).  42 packet  1  . citalopram (CELEXA) 20 MG tablet Take 1 tablet (20 mg total) by mouth daily.  30 tablet  5  . dexamethasone (DECADRON) 4 MG tablet 2 tabs by mouth with food twice daily on day before and 3 days after each chemo dose  30 tablet  2  . HYDROcodone-acetaminophen (NORCO/VICODIN) 5-325 MG per tablet Take 1-2 tablets by mouth every 6 (six) hours as needed.  30 tablet  0  . hydrocortisone (ANUSOL-HC) 25 MG suppository Place 1 suppository (25 mg total) rectally 2 (two) times daily.  12 suppository  1  . levothyroxine (SYNTHROID, LEVOTHROID) 112 MCG tablet Take 112 mcg by mouth daily before breakfast. Take sat and  sunday      . lidocaine-prilocaine (EMLA) cream Apply 1 application topically as needed. 1-2 hr before each port access  30 g  5  . LORazepam (ATIVAN) 0.5 MG tablet Take 0.5 mg by mouth 2 (two) times daily.      . metoCLOPramide (REGLAN) 10 MG tablet 1 tab by mouth with meals  x 3 days after each chemo, then 1 tab by mouth every 6 hrs if needed for nausea      . omeprazole (PRILOSEC) 40 MG capsule Take 1 capsule (40 mg total) by mouth daily.  30 capsule  4  . ondansetron (ZOFRAN) 8 MG tablet 1 tab by mouth twice daily x 3 days after chemo, then 1 tab by mouth every 12 hrs if needed for nausea  30 tablet  2  . potassium chloride SA (K-DUR,KLOR-CON) 20 MEQ tablet Take 1 tablet (20 mEq total) by mouth daily.  30 tablet  2  . promethazine (PHENERGAN) 25 MG tablet Take 25 mg by mouth every 6 (six) hours as needed for nausea or vomiting. Pt taking at bedtime.      Marland Kitchen SYNTHROID 125 MCG tablet Take 125 mcg by mouth every morning. Monday-Friday-5 days a week       No current facility-administered medications for this visit.   Facility-Administered Medications Ordered in Other Visits  Medication Dose Route Frequency Provider Last Rate Last Dose  . sodium chloride 0.9 % injection 10 mL  10 mL Intracatheter PRN Amy Milda Smart, PA-C   10 mL at 11/25/13 1317    OBJECTIVE: Middle-aged white woman who appears tired but is in no acute distress Filed Vitals:   11/25/13 0850  BP: 144/88  Pulse: 85  Temp: 98.1 F (36.7 C)  Resp: 20   Body mass index is 32.79 kg/(m^2).    ECOG FS: 1 Filed Weights   11/25/13 0850  Weight: 215 lb 9.6 oz (97.796 kg)   Physical Exam: HEENT:  Sclerae anicteric.  Oropharynx clear and moist. No ulcerationsNo evidence of mucositis or oropharyngeal candidiasis. Neck supple, trachea midline.  NODES:  No cervical or supraclavicular lymphadenopathy palpated.  BREAST EXAM:  Breast exam deferred. Axillae are benign bilaterally, with no palpable lymphadenopathy. LUNGS:  Clear to  auscultation bilaterally with good excursion.  No crackles, wheezes or rhonchi HEART:  Regular rate and rhythm. No murmur appreciated. ABDOMEN:  Soft, nondistended, nontender.  No organomegaly or masses palpated. Positive bowel sounds.  MSK:  No focal spinal tenderness to palpation. Good range of motion bilaterally in the upper extremities. EXTREMITIES:  Trace pedal edema. No lymphedema in the left upper extremity. SKIN:  No visible rash is today.  No visible nail dyscrasia. No excessive ecchymoses.  No petechiae. No pallor. NEURO:  Nonfocal. Well oriented. Appropriate affect. Skin: Examination of the scalp reveals  an irritated hair follicle with a single hair in the posterior aspect of the scalp, there is mild erythema but no purulent collection.  this area is slightly tender.  LAB RESULTS: CBC    Component Value Date/Time   WBC 6.3 11/25/2013 0838   RBC 3.24* 11/25/2013 0838   HGB 9.3* 11/25/2013 0838   HCT 29.9* 11/25/2013 0838   PLT 197 11/25/2013 0838   MCV 92.3 11/25/2013 0838   MCH 28.7 11/25/2013 0838   MCHC 31.1* 11/25/2013 0838   RDW 21.9* 11/25/2013 0838   LYMPHSABS 0.7* 11/25/2013 0838   MONOABS 0.5 11/25/2013 0838   EOSABS 0.0 11/25/2013 0838   BASOSABS 0.0 11/25/2013 0838    CMP     Component Value Date/Time   NA 140 11/25/2013 0838   K 3.7 11/25/2013 0838   CO2 25 11/25/2013 0838   GLUCOSE 137 11/25/2013 0838   BUN 10.0 11/25/2013 0838   CREATININE 0.7 11/25/2013 0838   CALCIUM 9.3 11/25/2013 0838   PROT 6.2* 11/25/2013 0838   ALBUMIN 3.5 11/25/2013 0838   AST 16 11/25/2013 0838   ALT 20 11/25/2013 0838   ALKPHOS 87 11/25/2013 0838   BILITOT 0.43 11/25/2013 0838      STUDIES: No results found.    ASSESSMENT: 58 y.o. [place] woman, status post left breast biopsy 06/03/2013 for a clinical T2 N0, stage IIA invasive ductal carcinoma, grade 3, estrogen and progesterone receptor positive, with an MIB-1 of 44%, and HER-2 amplified.  (1) status post left lumpectomy and sentinel lymph node sampling  07-2013 for a pT2 pN0, stage IIA invasive ductal carcinoma, grade 3, with negative margins.  (2) being treated in the adjuvant setting, the plan being to treat with 6 q. three-week doses of docetaxel/carboplatin (AUC 5) given along with trastuzumab/pertuzumab, first dose on 09/02/2013. Neulasta on day 2 for granulocyte support.  (3) trastuzumab and pertuzumab to continue to total one year  (4) adjuvant radiation to follow chemotherapy  (5) adjuvant anti-estrogens to follow radiation  (6)  Depression/anxiety - on Celexa, 20 mg   PLAN: Lori Vincent continues to tolerate this regimen reasonably well. Her biggest concern at this point would be her continued peripheral neuropathy, currently affecting  her lower extremities. This will need to be followed very closely in the future, and should it worsen, we may need to hold the docetaxel and reassess.   She will proceed today with day 1 of cycle 5 as scheduled without any dose modifications. She'll followup as previously scheduled on 12/02/2013. I She will continue with the same antinausea medications which she seems to be tolerating well. She will again utilize a "steroid tail" for an additional 3 days which she has found very helpful. She will monitor herself very closely for increased signs of peripheral neuropathy.  The overall plan is to complete a total of 6 adjuvant cycles of chemotherapy, followed by adjuvant radiation. We will plan on continuing at least the trastuzumab every 3 weeks for total of one year, and will discuss further whether or not to continue or discontinue the pertuzumab after these first 6 cycles.  Regarding the irritated hair follicle, she may apply warm compresses to this area for symptomatic relief. She is to keep in a nondistended should it about her further we will reevaluate.  Lori Vincent voices her understanding and agreement with the above plan. As always, she knows to call with any changes  or problems.   Carlton Adam,  PA-C   11/25/2013 2:49 PM

## 2013-11-25 NOTE — Patient Instructions (Signed)
[facility] Discharge Instructions for Patients Receiving Chemotherapy  Today you received the following chemotherapy agents Carboplatin, Taxotere, herceptin , perjeta  To help prevent nausea and vomiting after your treatment, we encourage you to take your nausea medication {as prescribed. If you develop nausea and vomiting that is not controlled by your nausea medication, call the clinic.   BELOW ARE SYMPTOMS THAT SHOULD BE REPORTED IMMEDIATELY:  *FEVER GREATER THAN 100.5 F  *CHILLS WITH OR WITHOUT FEVER  NAUSEA AND VOMITING THAT IS NOT CONTROLLED WITH YOUR NAUSEA MEDICATION  *UNUSUAL SHORTNESS OF BREATH  *UNUSUAL BRUISING OR BLEEDING  TENDERNESS IN MOUTH AND THROAT WITH OR WITHOUT PRESENCE OF ULCERS  *URINARY PROBLEMS  *BOWEL PROBLEMS  UNUSUAL RASH Items with * indicate a potential emergency and should be followed up as soon as possible.  Feel free to call the clinic you have any questions or concerns. The clinic phone number is [phone].

## 2013-11-26 NOTE — Patient Instructions (Signed)
Continue labs and chemotherapy as scheduled Follow-up in one week 

## 2013-11-27 ENCOUNTER — Ambulatory Visit (HOSPITAL_BASED_OUTPATIENT_CLINIC_OR_DEPARTMENT_OTHER): Payer: BC Managed Care – PPO

## 2013-11-27 VITALS — BP 130/81 | HR 99 | Temp 98.5°F

## 2013-11-27 DIAGNOSIS — Z5189 Encounter for other specified aftercare: Secondary | ICD-10-CM

## 2013-11-27 DIAGNOSIS — C50919 Malignant neoplasm of unspecified site of unspecified female breast: Secondary | ICD-10-CM

## 2013-11-27 DIAGNOSIS — C50512 Malignant neoplasm of lower-outer quadrant of left female breast: Secondary | ICD-10-CM

## 2013-11-27 MED ORDER — PEGFILGRASTIM INJECTION 6 MG/0.6ML
6.0000 mg | Freq: Once | SUBCUTANEOUS | Status: AC
  Administered 2013-11-27: 6 mg via SUBCUTANEOUS

## 2013-11-27 NOTE — Patient Instructions (Signed)
Pegfilgrastim injection (Neulasta) What is this medicine? PEGFILGRASTIM (peg fil GRA stim) helps the body make more white blood cells. It is used to prevent infection in people with low amounts of white blood cells following cancer treatment. This medicine may be used for other purposes; ask your health care provider or pharmacist if you have questions. COMMON BRAND NAME(S): Neulasta What should I tell my health care provider before I take this medicine? They need to know if you have any of these conditions: -sickle cell disease -an unusual or allergic reaction to pegfilgrastim, filgrastim, E.coli protein, other medicines, foods, dyes, or preservatives -pregnant or trying to get pregnant -breast-feeding How should I use this medicine? This medicine is for injection under the skin. It is usually given by a health care professional in a hospital or clinic setting. If you get this medicine at home, you will be taught how to prepare and give this medicine. Do not shake this medicine. Use exactly as directed. Take your medicine at regular intervals. Do not take your medicine more often than directed. It is important that you put your used needles and syringes in a special sharps container. Do not put them in a trash can. If you do not have a sharps container, call your pharmacist or healthcare provider to get one. Talk to your pediatrician regarding the use of this medicine in children. While this drug may be prescribed for children who weigh more than 45 kg for selected conditions, precautions do apply Overdosage: If you think you have taken too much of this medicine contact a poison control center or emergency room at once. NOTE: This medicine is only for you. Do not share this medicine with others. What if I miss a dose? If you miss a dose, take it as soon as you can. If it is almost time for your next dose, take only that dose. Do not take double or extra doses. What may interact with this  medicine? -lithium -medicines for growth therapy This list may not describe all possible interactions. Give your health care provider a list of all the medicines, herbs, non-prescription drugs, or dietary supplements you use. Also tell them if you smoke, drink alcohol, or use illegal drugs. Some items may interact with your medicine. What should I watch for while using this medicine? Visit your doctor for regular check ups. You will need important blood work done while you are taking this medicine. What side effects may I notice from receiving this medicine? Side effects that you should report to your doctor or health care professional as soon as possible: -allergic reactions like skin rash, itching or hives, swelling of the face, lips, or tongue -breathing problems -fever -pain, redness, or swelling where injected -shoulder pain -stomach or side pain Side effects that usually do not require medical attention (report to your doctor or health care professional if they continue or are bothersome): -aches, pains -headache -loss of appetite -nausea, vomiting -unusually tired This list may not describe all possible side effects. Call your doctor for medical advice about side effects. You may report side effects to FDA at 1-800-FDA-1088. Where should I keep my medicine? Keep out of the reach of children. Store in a refrigerator between 2 and 8 degrees C (36 and 46 degrees F). Do not freeze. Keep in carton to protect from light. Throw away this medicine if it is left out of the refrigerator for more than 48 hours. Throw away any unused medicine after the expiration date. NOTE: This sheet   is a summary. It may not cover all possible information. If you have questions about this medicine, talk to your doctor, pharmacist, or health care provider.  2015, Elsevier/Gold Standard. (2007-12-14 15:41:44)  

## 2013-11-29 ENCOUNTER — Other Ambulatory Visit: Payer: Self-pay | Admitting: *Deleted

## 2013-11-29 DIAGNOSIS — C50512 Malignant neoplasm of lower-outer quadrant of left female breast: Secondary | ICD-10-CM

## 2013-11-29 MED ORDER — LORAZEPAM 0.5 MG PO TABS: 0.5000 mg | ORAL_TABLET | Freq: Two times a day (BID) | ORAL | Status: DC

## 2013-12-01 ENCOUNTER — Other Ambulatory Visit: Payer: Self-pay | Admitting: Hematology

## 2013-12-02 ENCOUNTER — Other Ambulatory Visit (HOSPITAL_BASED_OUTPATIENT_CLINIC_OR_DEPARTMENT_OTHER): Payer: BC Managed Care – PPO

## 2013-12-02 ENCOUNTER — Ambulatory Visit (HOSPITAL_BASED_OUTPATIENT_CLINIC_OR_DEPARTMENT_OTHER): Payer: BC Managed Care – PPO | Admitting: Hematology

## 2013-12-02 VITALS — BP 146/78 | HR 90 | Temp 98.3°F | Resp 18 | Ht 68.0 in | Wt 204.6 lb

## 2013-12-02 DIAGNOSIS — E876 Hypokalemia: Secondary | ICD-10-CM

## 2013-12-02 DIAGNOSIS — Z170 Estrogen receptor positive status [ER+]: Secondary | ICD-10-CM

## 2013-12-02 DIAGNOSIS — F341 Dysthymic disorder: Secondary | ICD-10-CM

## 2013-12-02 DIAGNOSIS — C50919 Malignant neoplasm of unspecified site of unspecified female breast: Secondary | ICD-10-CM

## 2013-12-02 DIAGNOSIS — C50512 Malignant neoplasm of lower-outer quadrant of left female breast: Secondary | ICD-10-CM

## 2013-12-02 LAB — CBC WITH DIFFERENTIAL/PLATELET
BASO%: 0 % (ref 0.0–2.0)
Basophils Absolute: 0 10*3/uL (ref 0.0–0.1)
EOS ABS: 0 10*3/uL (ref 0.0–0.5)
EOS%: 0 % (ref 0.0–7.0)
HCT: 30.5 % — ABNORMAL LOW (ref 34.8–46.6)
HGB: 9.9 g/dL — ABNORMAL LOW (ref 11.6–15.9)
LYMPH%: 60.1 % — AB (ref 14.0–49.7)
MCH: 29.4 pg (ref 25.1–34.0)
MCHC: 32.5 g/dL (ref 31.5–36.0)
MCV: 90.5 fL (ref 79.5–101.0)
MONO#: 0.6 10*3/uL (ref 0.1–0.9)
MONO%: 19.8 % — ABNORMAL HIGH (ref 0.0–14.0)
NEUT%: 20.1 % — AB (ref 38.4–76.8)
NEUTROS ABS: 0.6 10*3/uL — AB (ref 1.5–6.5)
PLATELETS: 169 10*3/uL (ref 145–400)
RBC: 3.37 10*6/uL — AB (ref 3.70–5.45)
RDW: 20.5 % — ABNORMAL HIGH (ref 11.2–14.5)
WBC: 3 10*3/uL — ABNORMAL LOW (ref 3.9–10.3)
lymph#: 1.8 10*3/uL (ref 0.9–3.3)

## 2013-12-02 LAB — COMPREHENSIVE METABOLIC PANEL (CC13)
ALK PHOS: 84 U/L (ref 40–150)
ALT: 21 U/L (ref 0–55)
ANION GAP: 10 meq/L (ref 3–11)
AST: 14 U/L (ref 5–34)
Albumin: 4 g/dL (ref 3.5–5.0)
BILIRUBIN TOTAL: 0.83 mg/dL (ref 0.20–1.20)
BUN: 18.4 mg/dL (ref 7.0–26.0)
CO2: 24 meq/L (ref 22–29)
CREATININE: 0.9 mg/dL (ref 0.6–1.1)
Calcium: 9.2 mg/dL (ref 8.4–10.4)
Chloride: 99 mEq/L (ref 98–109)
GLUCOSE: 122 mg/dL (ref 70–140)
Potassium: 3.1 mEq/L — ABNORMAL LOW (ref 3.5–5.1)
Sodium: 133 mEq/L — ABNORMAL LOW (ref 136–145)
Total Protein: 6.3 g/dL — ABNORMAL LOW (ref 6.4–8.3)

## 2013-12-02 LAB — MAGNESIUM (CC13): MAGNESIUM: 1.8 mg/dL (ref 1.5–2.5)

## 2013-12-02 NOTE — Progress Notes (Signed)
[place]  [phone] Fax:(336) [phone]     ID: Lori Vincent OB: [birth year]  MR#: [id number]  ASN#:[id number]  PCP: Lori Fusi, NP GYN:  Lori Posey, MD SU: Lori Overall, MD OTHER MD: Lori Koh, MD  CHIEF COMPLAINT: Left breast cancer/adjuvant chemotherapy s/p cycle 5 of TCH/P   HISTORY OF PRESENT ILLNESS:  Lori Vincent [facility] showing a possible mass in the left breast. Diagnostic left mammography and ultrasonography at the breast center 04/26/2013 confirmed an oval mass with microcalcifications in the 6:00 position of the left breast. There was an adjacent partially obscured 1.2 cm mass. On physical exam there was no palpable mass in the left breast. By ultrasonography, there was an oval mass at the 6:00 position measuring 1.4 cm, with a second mass measuring 1.1 cm. There were no abnormal lymph nodes noted in the left axilla.  Biopsy of the 6:00 mass in the left breast January 2015 showed ([id number]) and invasive ductal carcinoma, grade 3, estrogen receptor 97% positive, progesterone receptor 75% positive, with strong staining intensity, and MIB-144%. HER-2 was amplified, with the signals ratio 4.18 and in number per cell of 9.40 .  On 06/08/2013 the patient underwent bilateral breast MRI which showed a dumbbell-shaped area in the central left breast consisting of a 2 cm mass and 2 extensions or an adjacent masses, measuring a total of 2.9 cm. There were no abnormal appearing lymph nodes for any other areas of concern.  The patient's subsequent history is as detailed below.   INTERVAL HISTORY:  Lori Vincent comes for followup today she feeling well. She is noticing some irritation in the left eye usually she gets a bout of diarrhea for 3 days and she attributes it to perjeta. Recently her significant other also had a coinfection of MRSA and I discussed with her importance of doing good  infection control and observing neutropenic precautions. Patient also gone extended dose of steroids which help her for diarrhea she takes Imodium. Her last cycle cycle 6 of Taxotere and carboplatin is scheduled on July 23.  REVIEW OF SYSTEMS: Lori Vincent has had no fevers, chills, or night sweats. She continues to have moderate fatigue, although her fatigue level certainly worsened after coming off the dexamethasone. She has had no abnormal bruising or bleeding. She has some taste aversion, but no problems with nausea or emesis. She still alternates somewhat between mild constipation and diarrhea. She had some slight constipation last week and has some resulting hemorrhoids. These are uncomfortable, but fortunately she has had no rectal bleeding. As with previous cycles, she has now started having occasional loose stools which she is treating effectively with Imodium.  There has been no blood or mucus in the stool. She denies any increased shortness of breath, chest pain, or palpitations. She's had no abnormal headaches or dizziness. Her vision continues to be blurry, especially when taking the dexamethasone. Currently, she denies any unusual myalgias, arthralgias, or bony pain.  A detailed review of systems is otherwise stable and noncontributory.   PAST MEDICAL HISTORY: Past Medical History  Diagnosis Date  . Breast cancer 06/03/13    left, 6:00 o'clock  . Thyroid disease     hypothyroidism  . Hypothyroidism   . Depression     PAST SURGICAL HISTORY: Past Surgical History  Procedure Laterality Date  . Tonsillectomy  1972  . Cesarean section  04/1999  . Breast biopsy  1/14    lt  . Dilation and  curettage of uterus    . Colonoscopy  2013  . Breast lumpectomy with needle localization and axillary sentinel lymph node bx Left 07/26/2013    Procedure: BREAST LUMPECTOMY WITH NEEDLE LOCALIZATION AND AXILLARY SENTINEL LYMPH NODE BX;  Surgeon: Shann Medal, MD;  Location: [place];   Service: General;  Laterality: Left;  Marland Kitchen Mole removal Right 07/26/2013    Procedure: MOLE REMOVAL UNDER RIGHT BREAST;  Surgeon: Shann Medal, MD;  Location: [place];  Service: General;  Laterality: Right;  . Portacath placement Right 07/26/2013    Procedure: INSERTION PORT-A-CATH;  Surgeon: Shann Medal, MD;  Location: [place];  Service: General;  Laterality: Right;    FAMILY HISTORY Family History  Problem Relation Age of Onset  . Stroke Mother   . Diabetes Father   . Cancer Maternal Grandmother 45    colon   the patient's father died at the age of 74 in a car accident. The patient's mother died at the age of 57 from a stroke. The patient has one adopted brother. She has one biological sister. The patient's mother's mother was diagnosed with colon cancer at the age of 53. There is no history of breast oropharynx cancer in the family to the patient's knowledge.  She asked about her daughter needing testing for brca and i said no.  GYNECOLOGIC HISTORY:   (Reviewed 11/15/2013) Menarche age 51. First live birth age 46. The patient is GX P1. She took birth control pills for approximately 12 years remotely. She had a period in February of 2014 and then in December of 2014. She had been started on hormone replacement in December. That has since been discontinued.  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 Her husband died at [place] 3 years ago with soft tissue sarcoma. The patient's significant other also works as a Geophysicist/field seismologist. At home it is just the patient and her daughter 54, 60 years old, who just finished middle school. The patient has 2 stepchildren from her earlier marriage. 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  ADVANCED DIRECTIVES: Not in place   HEALTH MAINTENANCE:   (Updated 11/15/2013) History  Substance Use Topics  . Smoking status: Never Smoker   . Smokeless tobacco: Never Used  . Alcohol Use: Yes     Comment: 5-7 glasses per week of red wine     Colonoscopy: July 2014  PAP: November 2014  Bone density: November 2014  Lipid panel: Not on file  Allergies  Allergen Reactions  . Compazine [Prochlorperazine] Other (See Comments)    Extreme restlessness and mild extrapyramidal movement;  Patient tolerates Reglan with no problems    Current Outpatient Prescriptions  Medication Sig Dispense Refill  . cholestyramine light (PREVALITE) 4 G packet Take 1 packet (4 g total) by mouth 2 (two) times daily as needed (diarrhea).  42 packet  1  . citalopram (CELEXA) 20 MG tablet Take 1 tablet (20 mg total) by mouth daily.  30 tablet  5  . dexamethasone (DECADRON) 4 MG tablet 2 tabs by mouth with food twice daily on day before and 3 days after each chemo dose  30 tablet  2  . HYDROcodone-acetaminophen (NORCO/VICODIN) 5-325 MG per tablet Take 1-2 tablets by mouth every 6 (six) hours as needed.  30 tablet  0  .  hydrocortisone (ANUSOL-HC) 25 MG suppository Place 1 suppository (25 mg total) rectally 2 (two) times daily.  12 suppository  1  . levothyroxine (SYNTHROID, LEVOTHROID) 112 MCG tablet Take 112 mcg by mouth daily before breakfast. Take sat and sunday      . lidocaine-prilocaine (EMLA) cream Apply 1 application topically as needed. 1-2 hr before each port access  30 g  5  . LORazepam (ATIVAN) 0.5 MG tablet Take 1 tablet (0.5 mg total) by mouth 2 (two) times daily.  60 tablet  0  . metoCLOPramide (REGLAN) 10 MG tablet 1 tab by mouth with meals  x 3 days after each chemo, then 1 tab by mouth every 6 hrs if needed for nausea      . omeprazole (PRILOSEC) 40 MG capsule Take 1 capsule (40 mg total) by mouth daily.  30 capsule  4  . ondansetron (ZOFRAN) 8 MG tablet 1 tab by mouth twice daily x 3 days after chemo, then 1 tab by mouth every 12 hrs if needed for nausea   30 tablet  2  . potassium chloride SA (K-DUR,KLOR-CON) 20 MEQ tablet Take 1 tablet (20 mEq total) by mouth daily.  30 tablet  2  . promethazine (PHENERGAN) 25 MG tablet Take 25 mg by mouth every 6 (six) hours as needed for nausea or vomiting. Pt taking at bedtime.      Marland Kitchen SYNTHROID 125 MCG tablet Take 125 mcg by mouth every morning. Monday-Friday-5 days a week       No current facility-administered medications for this visit.    OBJECTIVE: Middle-aged white woman who appears tired but is in no acute distress Filed Vitals:   12/02/13 1003  BP: 146/78  Pulse: 90  Temp: 98.3 F (36.8 C)  Resp: 18   Body mass index is 31.12 kg/(m^2).    ECOG FS: 1 Filed Weights   12/02/13 1003  Weight: 204 lb 9.6 oz (92.806 kg)   Physical Exam: HEENT:  Sclerae anicteric.  Oropharynx clear and moist. No ulcerationsNo evidence of mucositis or oropharyngeal candidiasis. Neck supple, trachea midline.  NODES:  No cervical or supraclavicular lymphadenopathy palpated.  BREAST EXAM:  Breast exam deferred. Axillae are benign bilaterally, with no palpable lymphadenopathy. LUNGS:  Clear to auscultation bilaterally with good excursion.  No crackles, wheezes or rhonchi HEART:  Regular rate and rhythm. No murmur appreciated. ABDOMEN:  Soft, nondistended, nontender.  No organomegaly or masses palpated. Positive bowel sounds.  MSK:  No focal spinal tenderness to palpation. Good range of motion bilaterally in the upper extremities. EXTREMITIES:  Trace pedal edema. No lymphedema in the left upper extremity. SKIN:  No visible rash is today.  No visible nail dyscrasia. No excessive ecchymoses. No petechiae. No pallor. NEURO:  Nonfocal. Well oriented. Appropriate affect.   LAB RESULTS: CBC    Component Value Date/Time   WBC 3.0* 12/02/2013 0955   RBC 3.37* 12/02/2013 0955   HGB 9.9* 12/02/2013 0955   HCT 30.5* 12/02/2013 0955   PLT 169 12/02/2013 0955   MCV 90.5 12/02/2013 0955   MCH 29.4 12/02/2013 0955   MCHC 32.5 12/02/2013  0955   RDW 20.5* 12/02/2013 0955   LYMPHSABS 1.8 12/02/2013 0955   MONOABS 0.6 12/02/2013 0955   EOSABS 0.0 12/02/2013 0955   BASOSABS 0.0 12/02/2013 0955    CMP     Component Value Date/Time   NA 133* 12/02/2013 0955   K 3.1* 12/02/2013 0955   CO2 24 12/02/2013 0955   GLUCOSE 122 12/02/2013 0955  BUN 18.4 12/02/2013 0955   CREATININE 0.9 12/02/2013 0955   CALCIUM 9.2 12/02/2013 0955   PROT 6.3* 12/02/2013 0955   ALBUMIN 4.0 12/02/2013 0955   AST 14 12/02/2013 0955   ALT 21 12/02/2013 0955   ALKPHOS 84 12/02/2013 0955   BILITOT 0.83 12/02/2013 0955      STUDIES: She requested seeing her original mammo and mri and i showed to her.    ASSESSMENT: 58 y.o. [place] woman, status post left breast biopsy 06/03/2013 for a clinical T2 N0, stage IIA invasive ductal carcinoma, grade 3, estrogen and progesterone receptor positive, with an MIB-1 of 44%, and HER-2 amplified.  (1) status post left lumpectomy and sentinel lymph node sampling 07-2013 for a pT2 pN0, stage IIA invasive ductal carcinoma, grade 3, with negative margins.  (2) being treated in the adjuvant setting, the plan being to treat with 6 q. three-week doses of docetaxel/carboplatin (AUC 5) given along with trastuzumab/pertuzumab, first dose on 09/02/2013. Neulasta on day 2 for granulocyte support.  (3) trastuzumab to continue for one year  (4) adjuvant radiation to follow chemotherapy  (5) adjuvant anti-estrogens to follow radiation  (6)  Depression/anxiety - on Celexa, 20 mg   PLAN: Lori Vincent continues to tolerate this regimen reasonably well. Her biggest concern at this point would be her continued peripheral neuropathy, currently affecting  her lower extremities. This will need to be followed very closely in the future, and should it worsen, we may need to hold the docetaxel and reassess.   She will proceed today with day 1 of cycle 6 on 12/16/13. She will continue with the same antinausea medications which she seems to be tolerating well. She  will again utilize a "steroid tail" for an additional 3 days which she has found very helpful. She will monitor herself very closely for increased signs of peripheral neuropathy.  The Vincent plan is to complete a total of 6 adjuvant cycles of chemotherapy, followed by adjuvant radiation. We will plan on continuing at least the trastuzumab every 3 weeks for total of one year.  Lori Vincent voices her understanding and agreement with the above plan. As always, she knows to call with any changes or problems.   Glean Salvo, MD   12/02/2013 11:03 AM

## 2013-12-15 ENCOUNTER — Telehealth: Payer: Self-pay | Admitting: Oncology

## 2013-12-15 NOTE — Telephone Encounter (Signed)
pt cld to chge time for appt-time chage --pt understood

## 2013-12-16 ENCOUNTER — Ambulatory Visit (HOSPITAL_BASED_OUTPATIENT_CLINIC_OR_DEPARTMENT_OTHER): Payer: BC Managed Care – PPO | Admitting: Oncology

## 2013-12-16 ENCOUNTER — Other Ambulatory Visit (HOSPITAL_BASED_OUTPATIENT_CLINIC_OR_DEPARTMENT_OTHER): Payer: BC Managed Care – PPO

## 2013-12-16 ENCOUNTER — Ambulatory Visit (HOSPITAL_BASED_OUTPATIENT_CLINIC_OR_DEPARTMENT_OTHER): Payer: BC Managed Care – PPO

## 2013-12-16 ENCOUNTER — Telehealth: Payer: Self-pay | Admitting: *Deleted

## 2013-12-16 ENCOUNTER — Telehealth: Payer: Self-pay | Admitting: Oncology

## 2013-12-16 VITALS — BP 135/82 | HR 87 | Temp 97.6°F | Resp 20 | Ht 68.0 in | Wt 208.7 lb

## 2013-12-16 DIAGNOSIS — C50919 Malignant neoplasm of unspecified site of unspecified female breast: Secondary | ICD-10-CM

## 2013-12-16 DIAGNOSIS — C50512 Malignant neoplasm of lower-outer quadrant of left female breast: Secondary | ICD-10-CM

## 2013-12-16 DIAGNOSIS — Z170 Estrogen receptor positive status [ER+]: Secondary | ICD-10-CM

## 2013-12-16 DIAGNOSIS — D6481 Anemia due to antineoplastic chemotherapy: Secondary | ICD-10-CM

## 2013-12-16 DIAGNOSIS — G609 Hereditary and idiopathic neuropathy, unspecified: Secondary | ICD-10-CM

## 2013-12-16 DIAGNOSIS — T451X5A Adverse effect of antineoplastic and immunosuppressive drugs, initial encounter: Secondary | ICD-10-CM

## 2013-12-16 DIAGNOSIS — Z5111 Encounter for antineoplastic chemotherapy: Secondary | ICD-10-CM

## 2013-12-16 DIAGNOSIS — Z5112 Encounter for antineoplastic immunotherapy: Secondary | ICD-10-CM

## 2013-12-16 DIAGNOSIS — F411 Generalized anxiety disorder: Secondary | ICD-10-CM

## 2013-12-16 LAB — COMPREHENSIVE METABOLIC PANEL (CC13)
ALBUMIN: 3.5 g/dL (ref 3.5–5.0)
ALT: 20 U/L (ref 0–55)
ANION GAP: 8 meq/L (ref 3–11)
AST: 19 U/L (ref 5–34)
Alkaline Phosphatase: 84 U/L (ref 40–150)
BUN: 13 mg/dL (ref 7.0–26.0)
CHLORIDE: 106 meq/L (ref 98–109)
CO2: 26 meq/L (ref 22–29)
Calcium: 9.6 mg/dL (ref 8.4–10.4)
Creatinine: 0.7 mg/dL (ref 0.6–1.1)
Glucose: 108 mg/dl (ref 70–140)
Potassium: 3.9 mEq/L (ref 3.5–5.1)
Sodium: 141 mEq/L (ref 136–145)
Total Bilirubin: 0.42 mg/dL (ref 0.20–1.20)
Total Protein: 6.4 g/dL (ref 6.4–8.3)

## 2013-12-16 LAB — CBC WITH DIFFERENTIAL/PLATELET
BASO%: 0.1 % (ref 0.0–2.0)
BASOS ABS: 0 10*3/uL (ref 0.0–0.1)
EOS%: 0 % (ref 0.0–7.0)
Eosinophils Absolute: 0 10*3/uL (ref 0.0–0.5)
HEMATOCRIT: 28.1 % — AB (ref 34.8–46.6)
HEMOGLOBIN: 9.1 g/dL — AB (ref 11.6–15.9)
LYMPH#: 0.9 10*3/uL (ref 0.9–3.3)
LYMPH%: 9.5 % — ABNORMAL LOW (ref 14.0–49.7)
MCH: 30.1 pg (ref 25.1–34.0)
MCHC: 32.3 g/dL (ref 31.5–36.0)
MCV: 93 fL (ref 79.5–101.0)
MONO#: 0.9 10*3/uL (ref 0.1–0.9)
MONO%: 9.7 % (ref 0.0–14.0)
NEUT%: 80.7 % — AB (ref 38.4–76.8)
NEUTROS ABS: 7.3 10*3/uL — AB (ref 1.5–6.5)
Platelets: 199 10*3/uL (ref 145–400)
RBC: 3.02 10*6/uL — ABNORMAL LOW (ref 3.70–5.45)
RDW: 21.9 % — AB (ref 11.2–14.5)
WBC: 9 10*3/uL (ref 3.9–10.3)

## 2013-12-16 LAB — MAGNESIUM (CC13): MAGNESIUM: 1.8 mg/dL (ref 1.5–2.5)

## 2013-12-16 MED ORDER — ACETAMINOPHEN 325 MG PO TABS
ORAL_TABLET | ORAL | Status: AC
  Filled 2013-12-16: qty 2

## 2013-12-16 MED ORDER — DEXAMETHASONE SODIUM PHOSPHATE 20 MG/5ML IJ SOLN
20.0000 mg | Freq: Once | INTRAMUSCULAR | Status: AC
  Administered 2013-12-16: 20 mg via INTRAVENOUS

## 2013-12-16 MED ORDER — ACETAMINOPHEN 325 MG PO TABS
650.0000 mg | ORAL_TABLET | Freq: Once | ORAL | Status: AC
  Administered 2013-12-16: 650 mg via ORAL

## 2013-12-16 MED ORDER — SODIUM CHLORIDE 0.9 % IV SOLN
800.0000 mg/m2 | Freq: Once | INTRAVENOUS | Status: AC
  Administered 2013-12-16: 1748 mg via INTRAVENOUS
  Filled 2013-12-16: qty 45.97

## 2013-12-16 MED ORDER — DIPHENHYDRAMINE HCL 25 MG PO CAPS
25.0000 mg | ORAL_CAPSULE | Freq: Once | ORAL | Status: AC
  Administered 2013-12-16: 25 mg via ORAL

## 2013-12-16 MED ORDER — ONDANSETRON 16 MG/50ML IVPB (CHCC)
INTRAVENOUS | Status: AC
  Filled 2013-12-16: qty 16

## 2013-12-16 MED ORDER — SODIUM CHLORIDE 0.9 % IJ SOLN
10.0000 mL | INTRAMUSCULAR | Status: DC | PRN
  Administered 2013-12-16: 10 mL
  Filled 2013-12-16: qty 10

## 2013-12-16 MED ORDER — HEPARIN SOD (PORK) LOCK FLUSH 100 UNIT/ML IV SOLN
500.0000 [IU] | Freq: Once | INTRAVENOUS | Status: AC | PRN
  Administered 2013-12-16: 500 [IU]
  Filled 2013-12-16: qty 5

## 2013-12-16 MED ORDER — ONDANSETRON 16 MG/50ML IVPB (CHCC)
16.0000 mg | Freq: Once | INTRAVENOUS | Status: AC
  Administered 2013-12-16: 16 mg via INTRAVENOUS

## 2013-12-16 MED ORDER — SODIUM CHLORIDE 0.9 % IV SOLN
Freq: Once | INTRAVENOUS | Status: AC
  Administered 2013-12-16: 12:00:00 via INTRAVENOUS

## 2013-12-16 MED ORDER — SODIUM CHLORIDE 0.9 % IV SOLN
750.0000 mg | Freq: Once | INTRAVENOUS | Status: AC
  Administered 2013-12-16: 750 mg via INTRAVENOUS
  Filled 2013-12-16: qty 75

## 2013-12-16 MED ORDER — TRASTUZUMAB CHEMO INJECTION 440 MG
6.0000 mg/kg | Freq: Once | INTRAVENOUS | Status: AC
  Administered 2013-12-16: 588 mg via INTRAVENOUS
  Filled 2013-12-16: qty 28

## 2013-12-16 MED ORDER — DEXAMETHASONE SODIUM PHOSPHATE 20 MG/5ML IJ SOLN
INTRAMUSCULAR | Status: AC
  Filled 2013-12-16: qty 5

## 2013-12-16 MED ORDER — DIPHENHYDRAMINE HCL 25 MG PO CAPS
ORAL_CAPSULE | ORAL | Status: AC
  Filled 2013-12-16: qty 1

## 2013-12-16 NOTE — Telephone Encounter (Signed)
per pof to sch pt appt-sent to [facility] for pre-cert-MW sch trmts-sch Dr Malon Kindle pt sch adv I would call after pre-cert-Pt stated has MY CHART

## 2013-12-16 NOTE — Progress Notes (Signed)
[place]  Telephone:(336) [phone] Fax:(336) [phone]     ID: [name] OB: [birth year]  MR#: [id number]  GNF#:[id number]  PCP: [name], NP GYN:  [name], MD SU: [name], MD OTHER MD: Arloa Koh, MD  CHIEF COMPLAINT: estrogen receptor positive breast cancer CURRENT TREATMENT:adjuvant chemotherapy  HISTORY OF PRESENT ILLNESS: From the original intake note:  Raniah [facility] showing a possible mass in the left breast. Diagnostic left mammography and ultrasonography at the breast center 04/26/2013 confirmed an oval mass with microcalcifications in the 6:00 position of the left breast. There was an adjacent partially obscured 1.2 cm mass. On physical exam there was no palpable mass in the left breast. By ultrasonography, there was an oval mass at the 6:00 position measuring 1.4 cm, with a second mass measuring 1.1 cm. There were no abnormal lymph nodes noted in the left axilla.  Biopsy of the 6:00 mass in the left breast January 2015 showed ([id number]) and invasive ductal carcinoma, grade 3, estrogen receptor 97% positive, progesterone receptor 75% positive, with strong staining intensity, and MIB-144%. HER-2 was amplified, with the signals ratio 4.18 and in number per cell of 9.40 .  On 06/08/2013 the patient underwent bilateral breast MRI which showed a dumbbell-shaped area in the central left breast consisting of a 2 cm mass and 2 extensions or an adjacent masses, measuring a total of 2.9 cm. There were no abnormal appearing lymph nodes for any other areas of concern.  The patient's subsequent history is as detailed below.   INTERVAL HISTORY:  Lynda comes today for followup of her breast cancer. This is day 1 cycle 6 of 6 planned cycles of carboplatin, docetaxel, trastuzumab and pertuzumab.  REVIEW OF SYSTEMS: Sruthi has tolerated her treatments generally well. However she has  significant diarrhea starting a week after each pertuzumab treatment. This is becoming more and more of a problem and less and less well controlled on Imodium and Questran. In addition she is developing neuropathy over her feet. It is beginning to move into the ball of the feet. There is some tingling associated with this especially at night when she is less distracted. It is not improving. Aside from these problems, which are leading me to change her treatment today, she gets occasional blurry vision, has taste perversion, and some fatigue which she notices particularly when she climbs up stairs. A detailed review of systems today was otherwise noncontributory    PAST MEDICAL HISTORY: Past Medical History  Diagnosis Date  . Breast cancer 06/03/13    left, 6:00 o'clock  . Thyroid disease     hypothyroidism  . Hypothyroidism   . Depression     PAST SURGICAL HISTORY: Past Surgical History  Procedure Laterality Date  . Tonsillectomy  1972  . Cesarean section  04/1999  . Breast biopsy  1/14    lt  . Dilation and curettage of uterus    . Colonoscopy  2013  . Breast lumpectomy with needle localization and axillary sentinel lymph node bx Left 07/26/2013    Procedure: BREAST LUMPECTOMY WITH NEEDLE LOCALIZATION AND AXILLARY SENTINEL LYMPH NODE BX;  Surgeon: Shann Medal, MD;  Location: [place];  Service: General;  Laterality: Left;  Marland Kitchen Mole removal Right 07/26/2013    Procedure: MOLE REMOVAL UNDER RIGHT BREAST;  Surgeon: Shann Medal, MD;  Location: [place];  Service: General;  Laterality: Right;  . Portacath placement Right 07/26/2013  Procedure: INSERTION PORT-A-CATH;  Surgeon: Shann Medal, MD;  Location: [place];  Service: General;  Laterality: Right;    FAMILY HISTORY Family History  Problem Relation Age of Onset  . Stroke Mother   . Diabetes Father   . Cancer Maternal Grandmother 34    colon   the patient's father died at the age  of 22 in a car accident. The patient's mother died at the age of 25 from a stroke. The patient has one adopted brother. She has one biological sister. The patient's mother's mother was diagnosed with colon cancer at the age of 92. There is no history of breast oropharynx cancer in the family to the patient's knowledge.  She asked about her daughter needing testing for brca and i said no.  GYNECOLOGIC HISTORY:   (Reviewed 11/15/2013) Menarche age 38. First live birth age 65. The patient is GX P1. She took birth control pills for approximately 12 years remotely. She had a period in February of 2014 and then in December of 2014. She had been started on hormone replacement in December. That has since been discontinued.  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 Her husband died at [place] 3 years ago with soft tissue sarcoma. The patient's significant other also works as a Geophysicist/field seismologist. At home it is just the patient and her daughter 55, 72 years old, who just finished middle school. The patient has 2 stepchildren from her earlier marriage. [name] is a Games developer in [place] and [place] (masc.) studies criminal Justice.  ADVANCED DIRECTIVES: Not in place   HEALTH MAINTENANCE:  (Updated 11/15/2013) History  Substance Use Topics  . Smoking status: Never Smoker   . Smokeless tobacco: Never Used  . Alcohol Use: Yes     Comment: 5-7 glasses per week of red wine     Colonoscopy: July 2014  PAP: November 2014  Bone density: November 2014  Lipid panel: Not on file  Allergies  Allergen Reactions  . Compazine [Prochlorperazine] Other (See Comments)    Extreme restlessness and mild extrapyramidal movement;  Patient tolerates Reglan with no problems    Current Outpatient Prescriptions  Medication Sig Dispense Refill  . cholestyramine light (PREVALITE) 4 G  packet Take 1 packet (4 g total) by mouth 2 (two) times daily as needed (diarrhea).  42 packet  1  . citalopram (CELEXA) 20 MG tablet Take 1 tablet (20 mg total) by mouth daily.  30 tablet  5  . dexamethasone (DECADRON) 4 MG tablet 2 tabs by mouth with food twice daily on day before and 3 days after each chemo dose  30 tablet  2  . HYDROcodone-acetaminophen (NORCO/VICODIN) 5-325 MG per tablet Take 1-2 tablets by mouth every 6 (six) hours as needed.  30 tablet  0  . hydrocortisone (ANUSOL-HC) 25 MG suppository Place 1 suppository (25 mg total) rectally 2 (two) times daily.  12 suppository  1  . levothyroxine (SYNTHROID, LEVOTHROID) 112 MCG tablet Take 112 mcg by mouth daily before breakfast. Take sat and sunday      . lidocaine-prilocaine (EMLA) cream Apply 1 application topically as needed. 1-2 hr before each port access  30 g  5  . LORazepam (ATIVAN) 0.5 MG tablet Take 1 tablet (0.5 mg total) by mouth 2 (two) times daily.  60 tablet  0  . metoCLOPramide (REGLAN) 10 MG tablet  1 tab by mouth with meals  x 3 days after each chemo, then 1 tab by mouth every 6 hrs if needed for nausea      . omeprazole (PRILOSEC) 40 MG capsule Take 1 capsule (40 mg total) by mouth daily.  30 capsule  4  . ondansetron (ZOFRAN) 8 MG tablet 1 tab by mouth twice daily x 3 days after chemo, then 1 tab by mouth every 12 hrs if needed for nausea  30 tablet  2  . potassium chloride SA (K-DUR,KLOR-CON) 20 MEQ tablet Take 1 tablet (20 mEq total) by mouth daily.  30 tablet  2  . promethazine (PHENERGAN) 25 MG tablet Take 25 mg by mouth every 6 (six) hours as needed for nausea or vomiting. Pt taking at bedtime.      Marland Kitchen SYNTHROID 125 MCG tablet Take 125 mcg by mouth every morning. Monday-Friday-5 days a week       No current facility-administered medications for this visit.    OBJECTIVE: Middle-aged white woman in no acute distress Filed Vitals:   12/16/13 1035  BP: 135/82  Pulse: 87  Temp: 97.6 F (36.4 C)  Resp: 20   Body  mass index is 31.74 kg/(m^2).    ECOG FS: 1 Filed Weights   12/16/13 1035  Weight: 208 lb 11.2 oz (94.666 kg)   Sclerae unicteric, pupils equal and reactive Oropharynx clear and moist-- no thrush or other lesions No cervical or supraclavicular adenopathy Lungs no rales or rhonchi Heart regular rate and rhythm Abd soft, nontender, positive bowel sounds MSK no focal spinal tenderness, no upper extremity lymphedema Neuro: nonfocal, well oriented, positive affect Breasts: Deferred    LAB RESULTS: CBC    Component Value Date/Time   WBC 9.0 12/16/2013 1020   RBC 3.02* 12/16/2013 1020   HGB 9.1* 12/16/2013 1020   HCT 28.1* 12/16/2013 1020   PLT 199 12/16/2013 1020   MCV 93.0 12/16/2013 1020   MCH 30.1 12/16/2013 1020   MCHC 32.3 12/16/2013 1020   RDW 21.9* 12/16/2013 1020   LYMPHSABS 0.9 12/16/2013 1020   MONOABS 0.9 12/16/2013 1020   EOSABS 0.0 12/16/2013 1020   BASOSABS 0.0 12/16/2013 1020    CMP     Component Value Date/Time   NA 133* 12/02/2013 0955   K 3.1* 12/02/2013 0955   CO2 24 12/02/2013 0955   GLUCOSE 122 12/02/2013 0955   BUN 18.4 12/02/2013 0955   CREATININE 0.9 12/02/2013 0955   CALCIUM 9.2 12/02/2013 0955   PROT 6.3* 12/02/2013 0955   ALBUMIN 4.0 12/02/2013 0955   AST 14 12/02/2013 0955   ALT 21 12/02/2013 0955   ALKPHOS 84 12/02/2013 0955   BILITOT 0.83 12/02/2013 0955      STUDIES: No results found.  ASSESSMENT: 58 y.o. [place] woman, status post left breast biopsy 06/03/2013 for a clinical T2 N0, stage IIA invasive ductal carcinoma, grade 3, estrogen and progesterone receptor positive, with an MIB-1 of 44%, and HER-2 amplified.  (1) status post left lumpectomy and sentinel lymph node sampling 07/26/2013 for a pT2 pN0, stage IIA invasive ductal carcinoma, grade 3, with negative margins.  (2) being treated in the adjuvant setting with 6 q. three-week doses of docetaxel/carboplatin (AUC 5) given along with trastuzumab/pertuzumab, first dose on 09/02/2013. Neulasta on day 2 for  granulocyte support.  (3) trastuzumab to continue for one year; most recent echo 08/11/2013 showed an excellent ejection fraction  (4) adjuvant radiation to follow chemotherapy  (5) adjuvant anti-estrogens to follow radiation  (  6)  Depression/anxiety - on Celexa, 20 mg  (7) chemotherapy-induced anemia    PLAN: Kialee completes her chemotherapy today. She is developing enough neuropathy in her feet that I am concerned about further dosing of Taxotere. Accordingly we are stopping the Taxotere today. I am substituting a single dose of Gemzar instead. In addition, we are stopping the Burgett, which is causing her severe diarrhea. She understands I do not have data comparing 5 doses of Burgett versus 6 or 5 doses of Taxotere versus 6, but my expectation is that we'll be minimal or no change in her overall prognosis. We did discuss the possible toxicities, side effects and complications of these changes and she is very much in agreement with that.  She will return tomorrow for  Neulasta  and then in one week for neutropenic check. August 13 she will start her every 3 week trastuzumab. I have set her up for an echocardiogram prior to that. I have also made her a return appointment with me for early September. We will be repeating the echocardiograms every 3 months until she completes her year of trastuzumab.  Aubryanna knows to call for any problems that may develop before her next visit here and in particular if any fever or bleeding were to develop  Chauncey Cruel, MD   12/16/2013 10:40 AM

## 2013-12-16 NOTE — Patient Instructions (Signed)
[facility] Discharge Instructions for Patients Receiving Chemotherapy  Today you received the following chemotherapy agents Herceptin, Gemzar and Carboplatin.  To help prevent nausea and vomiting after your treatment, we encourage you to take your nausea medication as instructed by your physician.   If you develop nausea and vomiting that is not controlled by your nausea medication, call the clinic.   BELOW ARE SYMPTOMS THAT SHOULD BE REPORTED IMMEDIATELY:  *FEVER GREATER THAN 100.5 F  *CHILLS WITH OR WITHOUT FEVER  NAUSEA AND VOMITING THAT IS NOT CONTROLLED WITH YOUR NAUSEA MEDICATION  *UNUSUAL SHORTNESS OF BREATH  *UNUSUAL BRUISING OR BLEEDING  TENDERNESS IN MOUTH AND THROAT WITH OR WITHOUT PRESENCE OF ULCERS  *URINARY PROBLEMS  *BOWEL PROBLEMS  UNUSUAL RASH Items with * indicate a potential emergency and should be followed up as soon as possible.  Feel free to call the clinic you have any questions or concerns. The clinic phone number is [phone].

## 2013-12-16 NOTE — Telephone Encounter (Signed)
Per staff message and POF I have scheduled appts. Advised scheduler of appts. JMW  

## 2013-12-17 ENCOUNTER — Telehealth: Payer: Self-pay | Admitting: *Deleted

## 2013-12-17 ENCOUNTER — Ambulatory Visit: Payer: BC Managed Care – PPO

## 2013-12-17 ENCOUNTER — Ambulatory Visit (HOSPITAL_BASED_OUTPATIENT_CLINIC_OR_DEPARTMENT_OTHER): Payer: BC Managed Care – PPO

## 2013-12-17 VITALS — BP 123/65 | HR 92 | Temp 98.2°F

## 2013-12-17 DIAGNOSIS — C50512 Malignant neoplasm of lower-outer quadrant of left female breast: Secondary | ICD-10-CM

## 2013-12-17 DIAGNOSIS — C50919 Malignant neoplasm of unspecified site of unspecified female breast: Secondary | ICD-10-CM

## 2013-12-17 DIAGNOSIS — Z5189 Encounter for other specified aftercare: Secondary | ICD-10-CM

## 2013-12-17 MED ORDER — PEGFILGRASTIM INJECTION 6 MG/0.6ML
6.0000 mg | Freq: Once | SUBCUTANEOUS | Status: AC
  Administered 2013-12-17: 6 mg via SUBCUTANEOUS
  Filled 2013-12-17: qty 0.6

## 2013-12-17 NOTE — Telephone Encounter (Signed)
ON VOICE MAIL- ENCOURAGED PT. TO FORCE FLUIDS AND EAT SMALL FREQUENT MEALS. INSTRUCTED PT. TO TAKE TIME FOR HERSELF AND REST. INFORMED PT. THERE IS A PHYSICIAN ON CALL 24/7 IF A PROBLEM SHOULD ARISE.

## 2013-12-23 ENCOUNTER — Ambulatory Visit: Payer: BC Managed Care – PPO

## 2013-12-23 ENCOUNTER — Other Ambulatory Visit: Payer: BC Managed Care – PPO

## 2013-12-24 ENCOUNTER — Encounter: Payer: Self-pay | Admitting: Radiation Oncology

## 2013-12-24 NOTE — Progress Notes (Signed)
Location of Breast Cancer: left 6:00 o'clock   Histology per Pathology Report:  07/26/13 Diagnosis 1. Lymph node, sentinel, biopsy, Left axillary #1 - ONE OF ONE LYMPH NODES NEGATIVE FOR CARCINOMA (0/1). 2. Lymph node, sentinel, biopsy, Left axillary - ONE OF ONE LYMPH NODES NEGATIVE FOR CARCINOMA (0/1). 3. Breast, lumpectomy, Left - INVASIVE DUCTAL CARCINOMA, GRADE 3, SPANNING 3 CM. - DUCTAL CARCINOMA IN SITU. - RESECTION MARGINS ARE NEGATIVE. - SEE ONCOLOGY TABLE. 4. Skin , Moles x3 under right breast - INTRADERMAL NEVUS (X1). - FIBROEPITHELIAL POLYP (X2). - NO MALIGNANCY.  06/04/13  Diagnosis  Breast, left, needle core biopsy, mass, 6 o'clock  - INVASIVE DUCTAL CARCINOMA, SEE COMMENT.  - DUCTAL CARCINOMA IN SITU.  - CALCIFICATIONS PRESENT.   Receptor Status: ER(97%), PR (75%), Her2-neu (+)   Did patient present with symptoms (if so, please note symptoms) or was this found on screening mammography?: screening mammogram   Past/Anticipated interventions by surgeon, if any: biopsy   Past/Anticipated interventions by medical oncology, if any: Chemotherapy - Dr Jana Hakim- being treated in the adjuvant setting with 6 q. three-week doses of docetaxel/carboplatin (AUC 5) given along with trastuzumab/pertuzumab, first dose on 09/02/2013. Neulasta on day 2 for granulocyte support.  (3) trastuzumab to continue for one year; most recent echo 08/11/2013 showed an excellent ejection fraction  (4) adjuvant radiation to follow chemotherapy  (5) adjuvant anti-estrogens to follow radiation  Lymphedema issues, if any: No   Pain issues, if any: None   SAFETY ISSUES:  Prior radiation? no  Pacemaker/ICD? no  Possible current pregnancy? no  Is the patient on methotrexate? 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

## 2013-12-25 DIAGNOSIS — Z9289 Personal history of other medical treatment: Secondary | ICD-10-CM

## 2013-12-25 HISTORY — DX: Personal history of other medical treatment: Z92.89

## 2013-12-27 ENCOUNTER — Encounter: Payer: Self-pay | Admitting: Hematology

## 2013-12-27 ENCOUNTER — Ambulatory Visit (HOSPITAL_BASED_OUTPATIENT_CLINIC_OR_DEPARTMENT_OTHER): Payer: BC Managed Care – PPO | Admitting: Hematology

## 2013-12-27 ENCOUNTER — Telehealth: Payer: Self-pay | Admitting: Oncology

## 2013-12-27 ENCOUNTER — Other Ambulatory Visit (HOSPITAL_BASED_OUTPATIENT_CLINIC_OR_DEPARTMENT_OTHER): Payer: BC Managed Care – PPO

## 2013-12-27 VITALS — BP 132/68 | HR 106 | Temp 98.5°F | Resp 18 | Ht 68.0 in | Wt 201.5 lb

## 2013-12-27 DIAGNOSIS — C50512 Malignant neoplasm of lower-outer quadrant of left female breast: Secondary | ICD-10-CM

## 2013-12-27 DIAGNOSIS — D696 Thrombocytopenia, unspecified: Secondary | ICD-10-CM

## 2013-12-27 DIAGNOSIS — E876 Hypokalemia: Secondary | ICD-10-CM

## 2013-12-27 DIAGNOSIS — R5381 Other malaise: Secondary | ICD-10-CM

## 2013-12-27 DIAGNOSIS — R5383 Other fatigue: Secondary | ICD-10-CM

## 2013-12-27 DIAGNOSIS — C50919 Malignant neoplasm of unspecified site of unspecified female breast: Secondary | ICD-10-CM

## 2013-12-27 DIAGNOSIS — D649 Anemia, unspecified: Secondary | ICD-10-CM

## 2013-12-27 DIAGNOSIS — Z170 Estrogen receptor positive status [ER+]: Secondary | ICD-10-CM

## 2013-12-27 DIAGNOSIS — F341 Dysthymic disorder: Secondary | ICD-10-CM

## 2013-12-27 LAB — CBC WITH DIFFERENTIAL/PLATELET
BASO%: 0.5 % (ref 0.0–2.0)
Basophils Absolute: 0 10*3/uL (ref 0.0–0.1)
EOS%: 0 % (ref 0.0–7.0)
Eosinophils Absolute: 0 10*3/uL (ref 0.0–0.5)
HEMATOCRIT: 26.1 % — AB (ref 34.8–46.6)
HEMOGLOBIN: 8.5 g/dL — AB (ref 11.6–15.9)
LYMPH%: 41.2 % (ref 14.0–49.7)
MCH: 30.6 pg (ref 25.1–34.0)
MCHC: 32.6 g/dL (ref 31.5–36.0)
MCV: 94.1 fL (ref 79.5–101.0)
MONO#: 0.5 10*3/uL (ref 0.1–0.9)
MONO%: 8 % (ref 0.0–14.0)
NEUT%: 50.3 % (ref 38.4–76.8)
NEUTROS ABS: 3 10*3/uL (ref 1.5–6.5)
PLATELETS: 29 10*3/uL — AB (ref 145–400)
RBC: 2.78 10*6/uL — AB (ref 3.70–5.45)
RDW: 18 % — ABNORMAL HIGH (ref 11.2–14.5)
WBC: 5.9 10*3/uL (ref 3.9–10.3)
lymph#: 2.4 10*3/uL (ref 0.9–3.3)

## 2013-12-27 LAB — COMPREHENSIVE METABOLIC PANEL (CC13)
ALK PHOS: 91 U/L (ref 40–150)
ALT: 25 U/L (ref 0–55)
AST: 17 U/L (ref 5–34)
Albumin: 3.7 g/dL (ref 3.5–5.0)
Anion Gap: 11 mEq/L (ref 3–11)
BUN: 9.1 mg/dL (ref 7.0–26.0)
CALCIUM: 9.2 mg/dL (ref 8.4–10.4)
CHLORIDE: 104 meq/L (ref 98–109)
CO2: 25 mEq/L (ref 22–29)
CREATININE: 0.8 mg/dL (ref 0.6–1.1)
Glucose: 122 mg/dl (ref 70–140)
Potassium: 2.8 mEq/L — CL (ref 3.5–5.1)
Sodium: 139 mEq/L (ref 136–145)
Total Bilirubin: 0.42 mg/dL (ref 0.20–1.20)
Total Protein: 6.2 g/dL — ABNORMAL LOW (ref 6.4–8.3)

## 2013-12-27 LAB — MAGNESIUM (CC13): MAGNESIUM: 1.7 mg/dL (ref 1.5–2.5)

## 2013-12-27 NOTE — Telephone Encounter (Signed)
, °

## 2013-12-27 NOTE — Progress Notes (Signed)
[facility]  Telephone:([phone]) [phone] Fax:(336) [phone]     ID: Lori Vincent OB: 1955/06/15  MR#: [id number]  BWI#:[id number]  PCP: [name], NP GYN:  [name], MD SU: [name], MD OTHER MD: Arloa Koh, MD  CHIEF COMPLAINT: Left breast cancer s/p completion of adjuvant chemotherapy for f/u.  PATIENT IDENTIFICATION:  Lori Vincent is 58 years old [facility] showing a possible mass in the left breast. Diagnostic left mammography and ultrasonography at the breast center 04/26/2013 confirmed an oval mass with microcalcifications in the 6:00 position of the left breast. There was an adjacent partially obscured 1.2 cm mass. On physical exam there was no palpable mass in the left breast. By ultrasonography, there was an oval mass at the 6:00 position measuring 1.4 cm, with a second mass measuring 1.1 cm. There were no abnormal lymph nodes noted in the left axilla.  Biopsy of the 6:00 mass in the left breast January 2015 showed ([id number]) and invasive ductal carcinoma, grade 3, estrogen receptor 97% positive, progesterone receptor 75% positive, with strong staining intensity, and MIB-144%. HER-2 was amplified, with the signals ratio 4.18 and in number per cell of 9.40 .  On 06/08/2013 the patient underwent bilateral breast MRI which showed a dumbbell-shaped area in the central left breast consisting of a 2 cm mass and 2 extensions or an adjacent masses, measuring a total of 2.9 cm. There were no abnormal appearing lymph nodes for any other areas of concern.    She is status post left lumpectomy and sentinel lymph node sampling 07-2013 with Dr Lucia Gaskins for a pT2 pN0, stage IIA invasive ductal carcinoma, grade 3, with negative margins.She started adjuvant chemotherapy, the plan being to treat with 6 q. three-week doses of docetaxel/carboplatin (AUC 5) given along with trastuzumab/pertuzumab, first dose on 09/02/2013.  Neulasta on day 2 for granulocyte support. Trastuzumab to continue to total one year. Adjuvant radiation to follow chemotherapy and adjuvant anti-estrogens to follow radiation.   Here is her chemo flow sheet and she got her cycle 6 chemo on 12/16/2013 and neulasta shot on 12/17/2013.  She is seeing Dr [name] in [place] tomorrow.  INTERVAL HISTORY:  Lori Vincent returns alone today for followup of her left breast cancer. She is currently day 12 cycle 6 of 6 planned q. three-week doses of docetaxel/carboplatin, given along with trastuzumab/pertuzumab in the adjuvant setting. She receives Neulasta on day 2 for granulocyte support.  She does take a taper of dexamethasone following treatment, and this continued she reports some diarrhea that lasted about a week after chemotherapy. .She continues to have some tingling intermittently in the tips of her toes. She feels like this worsens slightly immediately following treatment, then "fades a little" towards the end of this cycle before coming back with the next infusion. Overall, she feels like it is stable. Is not affecting her ability to walk, and is affecting none of her day-to-day activities. Her nails are sensitive, but she denies any actual neuropathy in the upper extremities. She has completed chemo and happy about it. She asked me a friend of hers got an oncotype assay test and why didn't she get one and I explained her the reason. She was already determined high risk and with her 2 test being positive a candidate for adjuvant chemo so the test would not have helped [place] in making a decision regarding her chemo. Her K (Potassium) and platelet count is low today and I addressed them.  REVIEW OF SYSTEMS:  Lori Vincent has had no fevers, chills, or night sweats. She continues to have moderate fatigue, although her fatigue level certainly worsened after coming off the dexamethasone. She has had no abnormal bruising or bleeding. She has some taste aversion, but no problems  with nausea or emesis. She still alternates somewhat between mild constipation and diarrhea.  She denies any increased shortness of breath, chest pain, or palpitations. She's had no abnormal headaches or dizziness. Her vision continues to be blurry, especially when taking the dexamethasone. Currently, she denies any unusual myalgias, arthralgias, or bony pain.  A detailed review of systems is otherwise stable and noncontributory.   PAST MEDICAL HISTORY: Past Medical History  Diagnosis Date  . Breast cancer 06/03/13    left, 6:00 o'clock  . Depression   . Hypothyroidism     PAST SURGICAL HISTORY: Past Surgical History  Procedure Laterality Date  . Tonsillectomy  1972  . Cesarean section  04/1999  . Breast biopsy  1/14    lt  . Dilation and curettage of uterus    . Colonoscopy  2013  . Breast lumpectomy with needle localization and axillary sentinel lymph node bx Left 07/26/2013    Procedure: BREAST LUMPECTOMY WITH NEEDLE LOCALIZATION AND AXILLARY SENTINEL LYMPH NODE BX;  Surgeon: Shann Medal, MD;  Location: [facility];  Service: General;  Laterality: Left;  Marland Kitchen Mole removal Right 07/26/2013    Procedure: MOLE REMOVAL UNDER RIGHT BREAST;  Surgeon: Shann Medal, MD;  Location: [facility];  Service: General;  Laterality: Right;  . Portacath placement Right 07/26/2013    Procedure: INSERTION PORT-A-CATH;  Surgeon: Shann Medal, MD;  Location: [facility];  Service: General;  Laterality: Right;    FAMILY HISTORY Family History  Problem Relation Age of Onset  . Stroke Mother   . Diabetes Father   . Cancer Maternal Grandmother 75    colon  The patient's father died at the age of 37 in a car accident. The patient's mother died at the age of 17 from a stroke. The patient has one adopted brother. She has one biological sister. The patient's mother's mother was diagnosed with colon cancer at the age of 92. There is no history of breast oropharynx  cancer in the family to the patient's knowledge.  GYNECOLOGIC HISTORY:   Menarche age 23. First live birth age 27. The patient is GX P1. She took birth control pills for approximately 12 years remotely. She had a period in February of 2014 and then in December of 2014. She had been started on hormone replacement in December. That has since been discontinued.  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 Her husband died at [place] 3 years ago with soft tissue sarcoma. The patient's significant other also works as a Geophysicist/field seismologist. At home it is just the patient and her daughter 79, 5 years old, who just finished middle school. The patient has 2 stepchildren from her earlier marriage. 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  ADVANCED DIRECTIVES: Not in place   HEALTH MAINTENANCE:  (Updated 11/15/2013) History  Substance Use Topics  . Smoking status: Never Smoker   . Smokeless tobacco: Never Used  . Alcohol Use: Yes     Comment: 5-7 glasses per week of red wine     Colonoscopy: July  2014  PAP: November 2014  Bone density: November 2014  Lipid panel: Not on file  Allergies  Allergen Reactions  . Compazine [Prochlorperazine] Other (See Comments)    Extreme restlessness and mild extrapyramidal movement;  Patient tolerates Reglan with no problems    Current Outpatient Prescriptions  Medication Sig Dispense Refill  . citalopram (CELEXA) 20 MG tablet Take 1 tablet (20 mg total) by mouth daily.  30 tablet  5  . levothyroxine (SYNTHROID, LEVOTHROID) 112 MCG tablet Take 112 mcg by mouth daily before breakfast. Take sat and sunday      . lidocaine-prilocaine (EMLA) cream Apply 1 application topically as needed. 1-2 hr before each port access  30 g  5  . LORazepam (ATIVAN) 0.5 MG tablet Take 1 tablet (0.5 mg total) by mouth 2 (two) times  daily.  60 tablet  0  . potassium chloride SA (K-DUR,KLOR-CON) 20 MEQ tablet Take 1 tablet (20 mEq total) by mouth daily.  30 tablet  2   No current facility-administered medications for this visit.    OBJECTIVE: Middle-aged white woman who appears tired but is in no acute distress Filed Vitals:   12/27/13 0903  BP: 132/68  Pulse: 106  Temp: 98.5 F (36.9 C)  Resp: 18   Body mass index is 30.65 kg/(m^2).    ECOG FS: 1 Filed Weights   12/27/13 0903  Weight: 201 lb 8 oz (91.4 kg)   Physical Exam: HEENT:  Sclerae anicteric.  Oropharynx clear and moist. No ulcerationsNo evidence of mucositis or oropharyngeal candidiasis. Neck supple, trachea midline.  NODES:  No cervical or supraclavicular lymphadenopathy palpated.  BREAST EXAM:  Breast exam deferred. Axillae are benign bilaterally, with no palpable lymphadenopathy. LUNGS:  Clear to auscultation bilaterally with good excursion.  No crackles, wheezes or rhonchi HEART:  Regular rate and rhythm. No murmur appreciated. ABDOMEN:  Soft, nondistended, nontender.  No organomegaly or masses palpated. Positive bowel sounds.  MSK:  No focal spinal tenderness to palpation. Good range of motion bilaterally in the upper extremities. EXTREMITIES:  Trace pedal edema. No lymphedema in the left upper extremity. SKIN:  No visible rash is today.  No visible nail dyscrasia. No excessive ecchymoses. No petechiae. No pallor. NEURO:  Nonfocal. Well oriented. Appropriate affect.   LAB RESULTS: CBC    CMP      STUDIES: No results found.    ASSESSMENT: 58 y.o. [place] woman, status post left breast biopsy 06/03/2013 for a clinical T2 N0, stage IIA invasive ductal carcinoma, grade 3, estrogen and progesterone receptor positive, with an MIB-1 of 44%, and HER-2 amplified.  (1) status post left lumpectomy and sentinel lymph node sampling 07-2013 for a pT2 pN0, stage IIA invasive ductal carcinoma, grade 3, with negative margins.  (2) being treated  in the adjuvant setting, the plan being to treat with 6 q. three-week doses of docetaxel/carboplatin (AUC 5) given along with trastuzumab/pertuzumab, first dose on 09/02/2013. Neulasta on day 2 for granulocyte support. Last chemo given on 12/16/13 and neulasta on 12/17/13.  (3) trastuzumab to continue to total one year  (4) adjuvant radiation to follow chemotherapy  (5) adjuvant anti-estrogens to follow radiation  (6)  Depression/anxiety - on Celexa, 20 mg   PLAN:  1. Patient have completed 6 cycles of TCH+P. Cycle 6 given on 12/16/2013.   2. She is seeing Dr [name] in [place] tomorrow to finalize adjuvant XRT plans.  3.  Today her labs indicated moderate anemia hb 8.5 gm but asymptomatic so no need to  transfuse. It's a cumulative bone marrow toxicity effect which will get better with time. We will repeat her labs in  a week and sometimes Radiation oncologist would prefer for [place] to keep hemoglobin above 10 gm for better radiosensitization and we will monitor that.  4. Also severe thrombocytopenia but no evidence of mucocutaneous bleeding or bruising. Her platelets are down to 29,000 but will get better in next 1-2 weeks. Thrombocytopenic precautions were discussed.  5. ANC 3000 so no risk for infection.  6. Hypokalemia with K of 2.8. I told her to take 60 meq of KCL daily and we will check her labs again in 1 week.  7. She is getting her Echocardiogram tomorrow to check on Cardiac EF.  8. Patient asked if OK to use regular Multivitamin pill and I said it is fine.  9. She wanted to know some numbers and stats about her risk of cancer recurrence and I told her that I will put her profile in computer program "Adjuvant online" which is a prognostic calculator and e-mail her the information at [email or web address] or I can discuss in person when I see her next week.  10.RTC in 1 week with labs and f/u with me.  Bernadene Bell, MD Medical Hematologist/Oncologist [facility] Pager: [phone] Office No: [phone]  12/27/2013 11:23 AM

## 2013-12-28 ENCOUNTER — Ambulatory Visit (HOSPITAL_COMMUNITY)
Admission: RE | Admit: 2013-12-28 | Discharge: 2013-12-28 | Disposition: A | Discharge status: Home / Self Care | Payer: BC Managed Care – PPO | Source: Ambulatory Visit | Attending: Oncology | Admitting: Oncology

## 2013-12-28 ENCOUNTER — Encounter: Payer: Self-pay | Admitting: Radiation Oncology

## 2013-12-28 ENCOUNTER — Telehealth: Payer: Self-pay | Admitting: *Deleted

## 2013-12-28 ENCOUNTER — Other Ambulatory Visit (HOSPITAL_COMMUNITY): Payer: BC Managed Care – PPO

## 2013-12-28 ENCOUNTER — Ambulatory Visit
Admission: RE | Admit: 2013-12-28 | Discharge: 2013-12-28 | Disposition: A | Discharge status: Home / Self Care | Payer: BC Managed Care – PPO | Source: Ambulatory Visit | Attending: Radiation Oncology | Admitting: Radiation Oncology

## 2013-12-28 ENCOUNTER — Telehealth: Payer: Self-pay | Admitting: Family Medicine

## 2013-12-28 VITALS — BP 141/67 | HR 92 | Temp 98.4°F | Resp 20 | Wt 204.5 lb

## 2013-12-28 DIAGNOSIS — Z170 Estrogen receptor positive status [ER+]: Secondary | ICD-10-CM | POA: Diagnosis not present

## 2013-12-28 DIAGNOSIS — Z0189 Encounter for other specified special examinations: Secondary | ICD-10-CM | POA: Insufficient documentation

## 2013-12-28 DIAGNOSIS — I519 Heart disease, unspecified: Secondary | ICD-10-CM

## 2013-12-28 DIAGNOSIS — T451X5A Adverse effect of antineoplastic and immunosuppressive drugs, initial encounter: Secondary | ICD-10-CM | POA: Diagnosis not present

## 2013-12-28 DIAGNOSIS — G622 Polyneuropathy due to other toxic agents: Secondary | ICD-10-CM | POA: Insufficient documentation

## 2013-12-28 DIAGNOSIS — C50512 Malignant neoplasm of lower-outer quadrant of left female breast: Secondary | ICD-10-CM

## 2013-12-28 DIAGNOSIS — D0590 Unspecified type of carcinoma in situ of unspecified breast: Secondary | ICD-10-CM | POA: Diagnosis not present

## 2013-12-28 DIAGNOSIS — C50919 Malignant neoplasm of unspecified site of unspecified female breast: Secondary | ICD-10-CM | POA: Insufficient documentation

## 2013-12-28 DIAGNOSIS — Z9221 Personal history of antineoplastic chemotherapy: Secondary | ICD-10-CM | POA: Diagnosis not present

## 2013-12-28 DIAGNOSIS — Z510 Encounter for antineoplastic radiation therapy: Secondary | ICD-10-CM | POA: Insufficient documentation

## 2013-12-28 NOTE — Addendum Note (Signed)
Encounter addended by: Rexene Edison, MD on: 12/28/2013  2:21 PM<BR>     Documentation filed: Arn Medal VN

## 2013-12-28 NOTE — Telephone Encounter (Signed)
Kristen from Dr Sung Amabile cld to inq aout the pt quart[place] appts per pt- Adv GM would set those per orders and we would call-adv ECHO has to be pre-cert-Kristen stated makes sense and would advise pt

## 2013-12-28 NOTE — Progress Notes (Signed)
Please see the Nurse Progress Note in the MD Initial Consult Encounter for this patient. 

## 2013-12-28 NOTE — Progress Notes (Signed)
  Echocardiogram 2D Echocardiogram has been performed.  Lori Vincent M 12/28/2013, 11:03 AM

## 2013-12-28 NOTE — Progress Notes (Signed)
CC:  Dr. [name]  Followup note:  Diagnosis: Stage IIA (T2 N0 M0) invasive ductal/DCIS of the left breast  History: Lori Vincent visits today for review and scheduling of her radiation therapy in the management of her T2 N0 invasive ductal/DCIS of the left breast. I first saw the patient in consultation on 06/24/2013. A screening mammogram on 04/13/2013 was suspicious for a new left breast mass. Spot compression views on 04/26/2013 showed an oval mass with microcalcifications in the 6:00 position. There was also an adjacent 1.2 cm mass. Ultrasound showed a mass with calcifications at 6:00, 1 cm from the left nipple measuring 1.3 x 0.9 x 1.4 cm with an adjacent hypoechoic mass measuring 1.1 x 1.0 x 0.6 cm. There were no abnormal lymph nodes. Biopsy on 06/03/2013 was diagnostic for invasive ductal carcinoma and DCIS. Invasive tumor was felt to be grade 3 and the DCIS was felt to be grade 2. The tumor was strongly ER positive at 97% and PR positive at 75% with an elevated proliferation Ki-67 of 44%. HER-2/neu was amplified. Breast MR on 06/08/2013 showed an irregular enhancing mass with biopsy changes. One mass measured 1.2 x 1.2 x 2.0 cm and posterior and medial to this mass were 2 adjacent masses altogether measuring 2.9 x 2.1 x 1.9 cm. She was seen in consultation by Dr. [name] and also Dr. Tressa Busman. On 07/26/2013 she underwent a left partial mastectomy and sentinel lymph node biopsy. She is found to have a 3 cm invasive ductal carcinoma along with DCIS. Margins were 1 cm for both invasive disease and in situ disease. Her tumor was grade 3. 2 sentinel lymph nodes were free of metastatic disease. She will onto receive adjuvant chemotherapy with carboplatin, docetaxel, trastuzumab and pertuzamab under the direction of Dr. Jana Vincent. She completed her chemotherapy on July 23. She'll continue with trastuzumab for one year. She is without complaints today.  Physical examination: Alert and  oriented. Filed Vitals:   12/28/13 0748  BP: 141/67  Pulse: 92  Temp: 98.4 F (36.9 C)  Resp: 20   Head and neck examination: Grossly unremarkable. Nodes: Without palpable cervical, supraclavicular, or axillary lymphadenopathy. Chest: Lungs clear. Breasts: She is large breasted. There is a partial mastectomy scar along the lower left breast at 6:00. No masses are appreciated. Right breast without masses or lesions. Extremities: Without edema.  Laboratory data: Lab Results  Component Value Date   WBC 5.9 12/27/2013   HGB 8.5* 12/27/2013   HCT 26.1* 12/27/2013   MCV 94.1 12/27/2013   PLT 29* 12/27/2013    Impression Stage II A (T2 N0 M0) invasive ductal/DCIS of the left breast. As discussed previously, I would like to repeat her left breast mammogram to confirm removal of all suspicious microcalcifications even though her surgical margins appear to be quite satisfactory. We discussed the potential acute and late toxicities of radiation therapy. She may benefit from deep inspiration breath-hold technology. She is probably too large breasted for hypo-fractionated radiation therapy, but we will take measurements to see if this is a possibility. Consent is signed today. Will have her return for simulation/treatment planning the week of August 17 and begin her radiation therapy the week of August 24. She'll be set up for a left breast mammogram at the [facility] next week.  Plan: As above.  30 minutes was spent face-to-face the patient, primarily counseling the patient and coordinating her care.

## 2013-12-28 NOTE — Telephone Encounter (Signed)
Called patient to inform of appt. For mammogram on 01/03/14- arrival time - 8 am @ [facility], lvm for a return call

## 2013-12-28 NOTE — Progress Notes (Signed)
Follow up new consult, has completed chemotherapy was started onoral Potassium wuith low K+ level 2.8, to take 57meq TID for 1 week then sees Dr. Lona Kettle  To recheck that level Echocardiogm today at 9am, no pain,  Appetite better, energy level is returning back to normal  7:53 AM

## 2013-12-29 ENCOUNTER — Telehealth: Payer: Self-pay | Admitting: Hematology

## 2013-12-30 ENCOUNTER — Telehealth: Payer: Self-pay | Admitting: *Deleted

## 2013-12-30 NOTE — Telephone Encounter (Signed)
Called patient to inform that mammogram has rescheduled for 01-06-14- arrival time - 3:15 pm @ [facility], lvm for a return call.

## 2013-12-30 NOTE — Telephone Encounter (Signed)
Per [facility] staff message scheduled appts. Advised scheduler 

## 2014-01-03 ENCOUNTER — Other Ambulatory Visit: Payer: Self-pay | Admitting: *Deleted

## 2014-01-03 DIAGNOSIS — C50512 Malignant neoplasm of lower-outer quadrant of left female breast: Secondary | ICD-10-CM

## 2014-01-03 MED ORDER — LORAZEPAM 0.5 MG PO TABS: 0.5000 mg | ORAL_TABLET | Freq: Two times a day (BID) | ORAL | Status: DC

## 2014-01-06 ENCOUNTER — Ambulatory Visit: Payer: BC Managed Care – PPO

## 2014-01-06 ENCOUNTER — Ambulatory Visit (HOSPITAL_COMMUNITY)
Admission: RE | Admit: 2014-01-06 | Discharge: 2014-01-06 | Disposition: A | Discharge status: Home / Self Care | Payer: BC Managed Care – PPO | Source: Ambulatory Visit | Attending: Oncology | Admitting: Oncology

## 2014-01-06 ENCOUNTER — Ambulatory Visit (HOSPITAL_BASED_OUTPATIENT_CLINIC_OR_DEPARTMENT_OTHER): Payer: BC Managed Care – PPO | Admitting: Hematology

## 2014-01-06 ENCOUNTER — Ambulatory Visit (HOSPITAL_BASED_OUTPATIENT_CLINIC_OR_DEPARTMENT_OTHER): Payer: BC Managed Care – PPO

## 2014-01-06 ENCOUNTER — Other Ambulatory Visit: Payer: Self-pay | Admitting: *Deleted

## 2014-01-06 ENCOUNTER — Other Ambulatory Visit (HOSPITAL_BASED_OUTPATIENT_CLINIC_OR_DEPARTMENT_OTHER): Payer: BC Managed Care – PPO

## 2014-01-06 ENCOUNTER — Other Ambulatory Visit: Payer: BC Managed Care – PPO

## 2014-01-06 VITALS — BP 147/67 | HR 102 | Temp 98.5°F | Resp 20 | Ht 68.0 in | Wt 215.0 lb

## 2014-01-06 DIAGNOSIS — T451X5A Adverse effect of antineoplastic and immunosuppressive drugs, initial encounter: Principal | ICD-10-CM

## 2014-01-06 DIAGNOSIS — D649 Anemia, unspecified: Secondary | ICD-10-CM | POA: Diagnosis not present

## 2014-01-06 DIAGNOSIS — R5381 Other malaise: Secondary | ICD-10-CM

## 2014-01-06 DIAGNOSIS — D6481 Anemia due to antineoplastic chemotherapy: Secondary | ICD-10-CM

## 2014-01-06 DIAGNOSIS — R0609 Other forms of dyspnea: Secondary | ICD-10-CM

## 2014-01-06 DIAGNOSIS — C50512 Malignant neoplasm of lower-outer quadrant of left female breast: Secondary | ICD-10-CM

## 2014-01-06 DIAGNOSIS — R42 Dizziness and giddiness: Secondary | ICD-10-CM

## 2014-01-06 DIAGNOSIS — R790 Abnormal level of blood mineral: Secondary | ICD-10-CM

## 2014-01-06 DIAGNOSIS — R609 Edema, unspecified: Secondary | ICD-10-CM

## 2014-01-06 DIAGNOSIS — R0989 Other specified symptoms and signs involving the circulatory and respiratory systems: Secondary | ICD-10-CM

## 2014-01-06 DIAGNOSIS — C50919 Malignant neoplasm of unspecified site of unspecified female breast: Secondary | ICD-10-CM

## 2014-01-06 DIAGNOSIS — E876 Hypokalemia: Secondary | ICD-10-CM

## 2014-01-06 DIAGNOSIS — Z5112 Encounter for antineoplastic immunotherapy: Secondary | ICD-10-CM

## 2014-01-06 DIAGNOSIS — R5383 Other fatigue: Secondary | ICD-10-CM

## 2014-01-06 DIAGNOSIS — Z170 Estrogen receptor positive status [ER+]: Secondary | ICD-10-CM

## 2014-01-06 DIAGNOSIS — R7989 Other specified abnormal findings of blood chemistry: Secondary | ICD-10-CM

## 2014-01-06 LAB — MAGNESIUM (CC13): MAGNESIUM: 1.5 mg/dL (ref 1.5–2.5)

## 2014-01-06 LAB — CBC WITH DIFFERENTIAL/PLATELET
BASO%: 0.4 % (ref 0.0–2.0)
Basophils Absolute: 0 10*3/uL (ref 0.0–0.1)
EOS%: 0.4 % (ref 0.0–7.0)
Eosinophils Absolute: 0 10*3/uL (ref 0.0–0.5)
HEMATOCRIT: 21.8 % — AB (ref 34.8–46.6)
HGB: 6.9 g/dL — CL (ref 11.6–15.9)
LYMPH#: 1.6 10*3/uL (ref 0.9–3.3)
LYMPH%: 33.5 % (ref 14.0–49.7)
MCH: 30.4 pg (ref 25.1–34.0)
MCHC: 31.7 g/dL (ref 31.5–36.0)
MCV: 96.2 fL (ref 79.5–101.0)
MONO#: 0.6 10*3/uL (ref 0.1–0.9)
MONO%: 12.8 % (ref 0.0–14.0)
NEUT#: 2.5 10*3/uL (ref 1.5–6.5)
NEUT%: 52.9 % (ref 38.4–76.8)
Platelets: 258 10*3/uL (ref 145–400)
RBC: 2.26 10*6/uL — ABNORMAL LOW (ref 3.70–5.45)
RDW: 18.6 % — ABNORMAL HIGH (ref 11.2–14.5)
WBC: 4.8 10*3/uL (ref 3.9–10.3)

## 2014-01-06 LAB — COMPREHENSIVE METABOLIC PANEL (CC13)
ALT: 19 U/L (ref 0–55)
AST: 22 U/L (ref 5–34)
Albumin: 3.6 g/dL (ref 3.5–5.0)
Alkaline Phosphatase: 73 U/L (ref 40–150)
Anion Gap: 10 mEq/L (ref 3–11)
BILIRUBIN TOTAL: 0.38 mg/dL (ref 0.20–1.20)
BUN: 12.3 mg/dL (ref 7.0–26.0)
CHLORIDE: 107 meq/L (ref 98–109)
CO2: 25 mEq/L (ref 22–29)
CREATININE: 0.8 mg/dL (ref 0.6–1.1)
Calcium: 9 mg/dL (ref 8.4–10.4)
Glucose: 103 mg/dl (ref 70–140)
Potassium: 3.4 mEq/L — ABNORMAL LOW (ref 3.5–5.1)
Sodium: 142 mEq/L (ref 136–145)
Total Protein: 6.1 g/dL — ABNORMAL LOW (ref 6.4–8.3)

## 2014-01-06 LAB — ABO/RH: ABO/RH(D): O POS

## 2014-01-06 LAB — PREPARE RBC (CROSSMATCH)

## 2014-01-06 MED ORDER — DIPHENHYDRAMINE HCL 25 MG PO CAPS
ORAL_CAPSULE | ORAL | Status: AC
  Filled 2014-01-06: qty 1

## 2014-01-06 MED ORDER — SODIUM CHLORIDE 0.9 % IV SOLN
Freq: Once | INTRAVENOUS | Status: AC
  Administered 2014-01-06: 15:00:00 via INTRAVENOUS

## 2014-01-06 MED ORDER — SODIUM CHLORIDE 0.9 % IV SOLN
2.0000 g | Freq: Once | INTRAVENOUS | Status: AC
  Administered 2014-01-06: 2 g via INTRAVENOUS
  Filled 2014-01-06: qty 4

## 2014-01-06 MED ORDER — POTASSIUM CHLORIDE CRYS ER 20 MEQ PO TBCR: 20.0000 meq | EXTENDED_RELEASE_TABLET | Freq: Every day | ORAL | Status: DC

## 2014-01-06 MED ORDER — SODIUM CHLORIDE 0.9 % IV SOLN: 2.0000 g | Freq: Once | INTRAVENOUS | Status: DC

## 2014-01-06 MED ORDER — DIPHENHYDRAMINE HCL 25 MG PO CAPS
25.0000 mg | ORAL_CAPSULE | Freq: Once | ORAL | Status: AC
  Administered 2014-01-06: 25 mg via ORAL

## 2014-01-06 MED ORDER — ACETAMINOPHEN 325 MG PO TABS
ORAL_TABLET | ORAL | Status: AC
  Filled 2014-01-06: qty 2

## 2014-01-06 MED ORDER — ACETAMINOPHEN 325 MG PO TABS
650.0000 mg | ORAL_TABLET | Freq: Once | ORAL | Status: AC
  Administered 2014-01-06: 650 mg via ORAL

## 2014-01-06 MED ORDER — HEPARIN SOD (PORK) LOCK FLUSH 100 UNIT/ML IV SOLN
500.0000 [IU] | Freq: Once | INTRAVENOUS | Status: AC | PRN
  Administered 2014-01-06: 500 [IU]
  Filled 2014-01-06: qty 5

## 2014-01-06 MED ORDER — SODIUM CHLORIDE 0.9 % IJ SOLN
10.0000 mL | INTRAMUSCULAR | Status: DC | PRN
  Administered 2014-01-06: 10 mL
  Filled 2014-01-06: qty 10

## 2014-01-06 MED ORDER — TRASTUZUMAB CHEMO INJECTION 440 MG
6.0000 mg/kg | Freq: Once | INTRAVENOUS | Status: AC
  Administered 2014-01-06: 567 mg via INTRAVENOUS
  Filled 2014-01-06: qty 27

## 2014-01-06 NOTE — Patient Instructions (Addendum)
[facility] Discharge Instructions for Patients Receiving Chemotherapy  Today you received the following chemotherapy agents: Herceptin  To help prevent nausea and vomiting after your treatment, we encourage you to take your nausea medication as prescribed by your physician.    If you develop nausea and vomiting that is not controlled by your nausea medication, call the clinic.   BELOW ARE SYMPTOMS THAT SHOULD BE REPORTED IMMEDIATELY:  *FEVER GREATER THAN 100.5 F  *CHILLS WITH OR WITHOUT FEVER  NAUSEA AND VOMITING THAT IS NOT CONTROLLED WITH YOUR NAUSEA MEDICATION  *UNUSUAL SHORTNESS OF BREATH  *UNUSUAL BRUISING OR BLEEDING  TENDERNESS IN MOUTH AND THROAT WITH OR WITHOUT PRESENCE OF ULCERS  *URINARY PROBLEMS  *BOWEL PROBLEMS  UNUSUAL RASH Items with * indicate a potential emergency and should be followed up as soon as possible.  Feel free to call the clinic you have any questions or concerns. The clinic phone number is [phone].   Magnesium Sulfate injection What is this medicine? MAGNESIUM SULFATE (mag NEE zee um SUL fate) is an electrolyte injection commonly used to treat low magnesium levels in your blood and to prevent or control certain seizures. This medicine may be used for other purposes; ask your health care provider or pharmacist if you have questions. What should I tell my health care provider before I take this medicine? They need to know if you have any of these conditions: -heart disease -history of irregular heart beat -kidney disease -an unusual or allergic reaction to magnesium sulfate, medicines, foods, dyes, or preservatives -pregnant or trying to get pregnant -breast-feeding How should I use this medicine? This medicine is for infusion into a vein. It is given by a health care professional in a hospital or clinic setting. Talk to your pediatrician regarding the use of this medicine in children. While this drug may be prescribed  for selected conditions, precautions do apply. Overdosage: If you think you have taken too much of this medicine contact a poison control center or emergency room at once. NOTE: This medicine is only for you. Do not share this medicine with others. What if I miss a dose? This does not apply. What may interact with this medicine? This medicine may interact with the following medications: -certain medicines for anxiety or sleep -certain medicines for seizures like phenobarbital -digoxin -medicines that relax muscles for surgery -narcotic medicines for pain This list may not describe all possible interactions. Give your health care provider a list of all the medicines, herbs, non-prescription drugs, or dietary supplements you use. Also tell them if you smoke, drink alcohol, or use illegal drugs. Some items may interact with your medicine. What should I watch for while using this medicine? Your condition will be monitored carefully while you are receiving this medicine. You may need blood work done while you are receiving this medicine. What side effects may I notice from receiving this medicine? Side effects that you should report to your doctor or health care professional as soon as possible: -allergic reactions like skin rash, itching or hives, swelling of the face, lips, or tongue -facial flushing -muscle weakness -signs and symptoms of low blood pressure like dizziness; feeling faint or lightheaded, falls; unusually weak or tired -signs and symptoms of a dangerous change in heartbeat or heart rhythm like chest pain; dizziness; fast or irregular heartbeat; palpitations; breathing problems -sweating This list may not describe all possible side effects. Call your doctor for medical advice about side effects. You may report side effects to  FDA at 1-800-FDA-1088. Where should I keep my medicine? This drug is given in a hospital or clinic and will not be stored at home. NOTE: This sheet is a  summary. It may not cover all possible information. If you have questions about this medicine, talk to your doctor, pharmacist, or health care provider.  2015, Elsevier/Gold Standard. (2012-09-21 10:35:11)

## 2014-01-07 ENCOUNTER — Ambulatory Visit: Payer: BC Managed Care – PPO

## 2014-01-07 ENCOUNTER — Encounter: Payer: Self-pay | Admitting: Hematology

## 2014-01-07 ENCOUNTER — Ambulatory Visit
Admission: RE | Admit: 2014-01-07 | Discharge: 2014-01-07 | Disposition: A | Discharge status: Home / Self Care | Payer: BC Managed Care – PPO | Source: Ambulatory Visit | Attending: Radiation Oncology | Admitting: Radiation Oncology

## 2014-01-07 ENCOUNTER — Ambulatory Visit (HOSPITAL_BASED_OUTPATIENT_CLINIC_OR_DEPARTMENT_OTHER): Payer: BC Managed Care – PPO

## 2014-01-07 ENCOUNTER — Telehealth: Payer: Self-pay | Admitting: Hematology

## 2014-01-07 VITALS — BP 118/74 | HR 79 | Temp 97.5°F | Resp 16

## 2014-01-07 DIAGNOSIS — D6481 Anemia due to antineoplastic chemotherapy: Secondary | ICD-10-CM

## 2014-01-07 DIAGNOSIS — D630 Anemia in neoplastic disease: Secondary | ICD-10-CM | POA: Insufficient documentation

## 2014-01-07 DIAGNOSIS — T451X5A Adverse effect of antineoplastic and immunosuppressive drugs, initial encounter: Principal | ICD-10-CM

## 2014-01-07 DIAGNOSIS — D649 Anemia, unspecified: Secondary | ICD-10-CM | POA: Diagnosis not present

## 2014-01-07 DIAGNOSIS — C50512 Malignant neoplasm of lower-outer quadrant of left female breast: Secondary | ICD-10-CM

## 2014-01-07 MED ORDER — SODIUM CHLORIDE 0.9 % IV SOLN
250.0000 mL | Freq: Once | INTRAVENOUS | Status: AC
  Administered 2014-01-07: 250 mL via INTRAVENOUS

## 2014-01-07 MED ORDER — HEPARIN SOD (PORK) LOCK FLUSH 100 UNIT/ML IV SOLN
500.0000 [IU] | Freq: Every day | INTRAVENOUS | Status: AC | PRN
  Administered 2014-01-07: 500 [IU]
  Filled 2014-01-07: qty 5

## 2014-01-07 MED ORDER — SODIUM CHLORIDE 0.9 % IJ SOLN
10.0000 mL | INTRAMUSCULAR | Status: AC | PRN
  Administered 2014-01-07: 10 mL
  Filled 2014-01-07: qty 10

## 2014-01-07 MED ORDER — ACETAMINOPHEN 325 MG PO TABS
650.0000 mg | ORAL_TABLET | Freq: Once | ORAL | Status: AC
  Administered 2014-01-07: 650 mg via ORAL

## 2014-01-07 MED ORDER — DIPHENHYDRAMINE HCL 25 MG PO CAPS
25.0000 mg | ORAL_CAPSULE | Freq: Once | ORAL | Status: AC
  Administered 2014-01-07: 25 mg via ORAL

## 2014-01-07 MED ORDER — ACETAMINOPHEN 325 MG PO TABS
ORAL_TABLET | ORAL | Status: AC
  Filled 2014-01-07: qty 2

## 2014-01-07 MED ORDER — DIPHENHYDRAMINE HCL 25 MG PO CAPS
ORAL_CAPSULE | ORAL | Status: AC
  Filled 2014-01-07: qty 1

## 2014-01-07 NOTE — Telephone Encounter (Signed)
s.w. pt and advised on Aug appt....pt ok and aware °

## 2014-01-07 NOTE — Patient Instructions (Signed)

## 2014-01-07 NOTE — Progress Notes (Signed)
[facility]  [phone]) [phone] Fax:(336) [phone]     ID: [name] OB: 1956-02-26  MR#: [id number]  FYB#:[id number]  PCP: [name], NP GYN:  [name], MD SU: [name], MD OTHER MD: Arloa Koh, MD  CHIEF COMPLAINT: Left breast cancer s/p completion of adjuvant chemotherapy for f/u.  PATIENT IDENTIFICATION:  Lori Vincent is 58 years old [facility] showing a possible mass in the left breast. Diagnostic left mammography and ultrasonography at the breast center 04/26/2013 confirmed an oval mass with microcalcifications in the 6:00 position of the left breast. There was an adjacent partially obscured 1.2 cm mass. On physical exam there was no palpable mass in the left breast. By ultrasonography, there was an oval mass at the 6:00 position measuring 1.4 cm, with a second mass measuring 1.1 cm. There were no abnormal lymph nodes noted in the left axilla.  Biopsy of the 6:00 mass in the left breast January 2015 showed ([id number]) and invasive ductal carcinoma, grade 3, estrogen receptor 97% positive, progesterone receptor 75% positive, with strong staining intensity, and MIB-144%. HER-2 was amplified, with the signals ratio 4.18 and in number per cell of 9.40 .  On 06/08/2013 the patient underwent bilateral breast MRI which showed a dumbbell-shaped area in the central left breast consisting of a 2 cm mass and 2 extensions or an adjacent masses, measuring a total of 2.9 cm. There were no abnormal appearing lymph nodes for any other areas of concern.    She is status post left lumpectomy and sentinel lymph node sampling 07-2013 with Dr Lucia Gaskins for a pT2 pN0, stage IIA invasive ductal carcinoma, grade 3, with negative margins.She started adjuvant chemotherapy, the plan being to treat with 6 q. three-week doses of docetaxel/carboplatin (AUC 5) given along with trastuzumab/pertuzumab, first dose on 09/02/2013.  Neulasta on day 2 for granulocyte support. Trastuzumab to continue to total one year. Adjuvant radiation to follow chemotherapy and adjuvant anti-estrogens to follow radiation.   Here is her chemo flow sheet and she got her cycle 6 chemo on 12/16/2013 and neulasta shot on 12/17/2013.  She was seen by Dr [name] in [place]. She is getting another mammogram tomorrow. The plan discussed was any where 4-6 weeks of XRT starting on January 18 2014. She did have labs today and comes with symptomatic anemia with a hemoglobin 6.9 grams. She never had blood transfusion in past.  INTERVAL HISTORY:  Harvest returns alone today for followup of her left breast cancer. She is having dyspnea on exertion, some dizziness and lot of fatigue. She also have more pedal edema. i discussed about ted stockings and keeping legs elevated. We can consider diuretics later as her low potassium has been a problem.  REVIEW OF SYSTEMS:  Janann has had no fevers, chills, or night sweats. She continues to have moderate fatigue, although her fatigue level certainly worsened after coming off the dexamethasone. She has had no abnormal bruising or bleeding. She has some taste aversion, but no problems with nausea or emesis. She still alternates somewhat between mild constipation and diarrhea.  She c/o increased shortness of breath related to exertion, no chest pain, or palpitations. She's had no abnormal headaches or dizziness. Her vision continues to be blurry, especially when taking the dexamethasone. Currently, she denies any unusual myalgias, arthralgias, or bony pain.  A detailed review of systems is otherwise stable and noncontributory.   PAST MEDICAL HISTORY: Past Medical History  Diagnosis Date  .  Breast cancer 06/03/13    left, 6:00 o'clock  . Depression   . Hypothyroidism   . Hypokalemia     PAST SURGICAL HISTORY: Past Surgical History  Procedure Laterality Date  . Tonsillectomy  1972  . Cesarean section  04/1999  .  Breast biopsy  1/14    lt  . Dilation and curettage of uterus    . Colonoscopy  2013  . Breast lumpectomy with needle localization and axillary sentinel lymph node bx Left 07/26/2013    Procedure: BREAST LUMPECTOMY WITH NEEDLE LOCALIZATION AND AXILLARY SENTINEL LYMPH NODE BX;  Surgeon: Shann Medal, MD;  Location: [facility];  Service: General;  Laterality: Left;  Marland Kitchen Mole removal Right 07/26/2013    Procedure: MOLE REMOVAL UNDER RIGHT BREAST;  Surgeon: Shann Medal, MD;  Location: [facility];  Service: General;  Laterality: Right;  . Portacath placement Right 07/26/2013    Procedure: INSERTION PORT-A-CATH;  Surgeon: Shann Medal, MD;  Location: [facility];  Service: General;  Laterality: Right;    FAMILY HISTORY Family History  Problem Relation Age of Onset  . Stroke Mother   . Diabetes Father   . Cancer Maternal Grandmother 76    colon  The patient's father died at the age of 13 in a car accident. The patient's mother died at the age of 19 from a stroke. The patient has one adopted brother. She has one biological sister. The patient's mother's mother was diagnosed with colon cancer at the age of 58. There is no history of breast oropharynx cancer in the family to the patient's knowledge.  GYNECOLOGIC HISTORY:   Menarche age 67. First live birth age 7. The patient is GX P1. She took birth control pills for approximately 12 years remotely. She had a period in February of 2014 and then in December of 2014. She had been started on hormone replacement in December. That has since been discontinued.  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 Her husband died at [place] 3 years ago with soft tissue sarcoma. The patient's significant other also works as a Geophysicist/field seismologist. At home it is just the patient and her daughter 15, 57 years old, who just  finished middle school. The patient has 2 stepchildren from her earlier marriage. 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  ADVANCED DIRECTIVES: Not in place   HEALTH MAINTENANCE:  (Updated 11/15/2013) History  Substance Use Topics  . Smoking status: Never Smoker   . Smokeless tobacco: Never Used  . Alcohol Use: Yes     Comment: 5-7 glasses per week of red wine     Colonoscopy: July 2014  PAP: November 2014  Bone density: November 2014  Lipid panel: Not on file  Allergies  Allergen Reactions  . Compazine [Prochlorperazine] Other (See Comments)    Extreme restlessness and mild extrapyramidal movement;  Patient tolerates Reglan with no problems    Current Outpatient Prescriptions  Medication Sig Dispense Refill  . citalopram (CELEXA) 20 MG tablet Take 1 tablet (20 mg total) by mouth daily.  30 tablet  5  . levothyroxine (SYNTHROID, LEVOTHROID) 112 MCG tablet Take 112 mcg by mouth daily before breakfast. Take sat and sunday      . lidocaine-prilocaine (EMLA) cream Apply 1 application  topically as needed. 1-2 hr before each port access  30 g  5  . LORazepam (ATIVAN) 0.5 MG tablet Take 1 tablet (0.5 mg total) by mouth 2 (two) times daily.  60 tablet  0  . potassium chloride SA (K-DUR,KLOR-CON) 20 MEQ tablet Take 1 tablet (20 mEq total) by mouth daily.  30 tablet  2   No current facility-administered medications for this visit.    OBJECTIVE: Middle-aged white woman who appears tired but is in no acute distress Filed Vitals:   01/06/14 1417  BP: 147/67  Pulse: 102  Temp: 98.5 F (36.9 C)  Resp: 20   Body mass index is 32.7 kg/(m^2).    ECOG FS: 1 Filed Weights   01/06/14 1417  Weight: 215 lb (97.523 kg)   Physical Exam: HEENT:  Sclerae anicteric.  Oropharynx clear and moist. No ulcerationsNo evidence of mucositis or oropharyngeal candidiasis. Neck supple, trachea midline.    NODES:  No cervical or supraclavicular lymphadenopathy palpated.  BREAST EXAM:  Breast exam deferred. Axillae are benign bilaterally, with no palpable lymphadenopathy. LUNGS:  Clear to auscultation bilaterally with good excursion.  No crackles, wheezes or rhonchi HEART:  Regular rate and rhythm. Flow murmur from anemia. ABDOMEN:  Soft, nondistended, nontender.  No organomegaly or masses palpated. Positive bowel sounds.  MSK:  No focal spinal tenderness to palpation. Good range of motion bilaterally in the upper extremities. EXTREMITIES:  2 + pedal edema. No lymphedema in the left upper extremity.mild redness in left leg but does not look like cellulitis. No fever or chills. We will watch it. If it gets worse patient will call us and we can recommend antibiotics. SKIN:  No visible rash is today.  No visible nail dyscrasia. No excessive ecchymoses. No petechiae. No pallor. NEURO:  Nonfocal. Well oriented. Appropriate affect.   LAB RESULTS: CBC    CMP      STUDIES: ------------------------------------------------------------------- Transthoracic Echocardiography 12/28/2013  ------------------------------------------------------------------ LV EF: 60% - 65%  Study Conclusions  - Left ventricle: The cavity size was normal. Wall thickness was normal. Systolic function was normal. The estimated ejection fraction was in the range of 60% to 65%. Lateral S&' 9.9 cm/sec.Global longitudinal strain -19.5%. Wall motion was normal; there were no regional wall motion abnormalities. Doppler parameters are consistent with abnormal left ventricular relaxation (grade 1 diastolic dysfunction).- Aortic valve: There was no stenosis.- Mitral valve: There was trivial regurgitation.- Right ventricle: The cavity size was normal. Systolic function was normal.- Tricuspid valve: Peak RV-RA gradient (S): 11 mm Hg.- Pulmonary arteries: PA peak pressure: 14 mm Hg (S).- Inferior vena cava: The vessel was normal in  size. Therespirophasic diameter changes were in the normal range (>= 50%),consistent with normal central venous pressure.  Impressions:  - Normal LV size and systolic function, EF 52-77%. Normal RV sizeand systolic function. No significant valvular abnormalities.Strain and lateral S&' as above     ASSESSMENT: 58 y.o. [place] woman, status post left breast biopsy 06/03/2013 for a clinical T2 N0, stage IIA invasive ductal carcinoma, grade 3, estrogen and progesterone receptor positive, with an MIB-1 of 44%, and HER-2 amplified.  (1) status post left lumpectomy and sentinel lymph node sampling 07-2013 for a pT2 pN0, stage IIA invasive ductal carcinoma, grade 3, with negative margins.  (2) being treated in the adjuvant setting, the plan being to treat with 6 q. three-week doses of docetaxel/carboplatin (AUC 5) given along with trastuzumab/pertuzumab, first dose on 09/02/2013. Neulasta on day 2 for granulocyte support. Last chemo given on 12/16/13  and neulasta on 12/17/13.  (3) trastuzumab to continue to total one year  (4) adjuvant radiation to follow chemotherapy  (5) adjuvant anti-estrogens to follow radiation  (6)  Depression/anxiety - on Celexa, 20 mg  (7) The major issue today is symptomatic anemia with a hemoglobin of 6.9 grams. Her platelets have recovered. She is planning a trip to [place] next week.   PLAN:  1. Patient have completed 6 cycles of TCH+P. Cycle 6 given on 12/16/2013. She gets herceptin today and I approved her orders.  2. She is seeing Dr [name] in [place] tomorrow to finalize adjuvant XRT plans.  3.  Today her labs indicated severe anemia Hemglobin 6.9 gm and she is symptomatic. She will get one unit of blood today and one tomorrow. It's a cumulative bone marrow toxicity effect which will get better with time. We will repeat her labs on 01/17/2014.   4. Her thrombocytopenia has resolved.  5. ANC 2500 so no risk for infection.  6. Hypokalemia with K of 3.4 up  from 2.8. I told her to continue to take 60 meq of KCL daily and we will check her labs again on 01/17/2014.  7. Her Echocardiogram shows normal Cardiac EF.  8.Boredrline low magnesium which can also cause hypokalemia so I will replace magnesium tomorrow.  10.RTC on 01/17/2014 with labs and f/u with me.  Bernadene Bell, MD Medical Hematologist/Oncologist [facility] Pager: [phone] Office No: [phone]

## 2014-01-08 LAB — TYPE AND SCREEN
ABO/RH(D): O POS
ANTIBODY SCREEN: NEGATIVE
UNIT DIVISION: 0
Unit division: 0

## 2014-01-10 ENCOUNTER — Ambulatory Visit
Admission: RE | Admit: 2014-01-10 | Discharge: 2014-01-10 | Disposition: A | Discharge status: Home / Self Care | Payer: BC Managed Care – PPO | Source: Ambulatory Visit | Attending: Radiation Oncology | Admitting: Radiation Oncology

## 2014-01-10 DIAGNOSIS — C50512 Malignant neoplasm of lower-outer quadrant of left female breast: Secondary | ICD-10-CM

## 2014-01-10 DIAGNOSIS — Z510 Encounter for antineoplastic radiation therapy: Secondary | ICD-10-CM | POA: Diagnosis not present

## 2014-01-10 NOTE — Progress Notes (Signed)
Complex simulation/treatment planning note: The patient was taken to the CT simulator. A Vaculoc immobilization device was constructed on a custom breast board. Her left breast borders were marked with radiopaque wires along with post mastectomy scar. She was initially scanned free breathing. It was determined at the cardiac silhouette would be within standard tangent fields, and [place] she was rescanned with deep inspiration and breath-hold. There was satisfactory movement of the cardiac silhouette away from the tangent fields. The CT data set was sent to the planning system I contoured her tumor bed. She was set up to medial and lateral tangential fields with 2 unique multileaf collimators designed to conform the field. She is now ready for treatment planning and 3-D simulation. I prescribing 4680 cGy and 26 sessions with deep inspiration breath-hold. This be followed by a photon boost for a further 1000 cGy 5 sessions. Optimal guidance is requested.

## 2014-01-11 ENCOUNTER — Encounter: Payer: Self-pay | Admitting: Radiation Oncology

## 2014-01-11 DIAGNOSIS — Z510 Encounter for antineoplastic radiation therapy: Secondary | ICD-10-CM | POA: Diagnosis not present

## 2014-01-11 NOTE — Progress Notes (Signed)
3-D simulation note: The patient underwent 3-D simulation for her left breast treatment. Dose volume histograms were obtained for the target and avoidance structures including the heart and lungs. We met our departmental goals. She is being treated with 1 electronic compensation and 1 static field for each tangent for a total of four complex treatment devices. I prescribing 4680 cGy and 26 sessions utilizing mixed 6 MV and 15 MV photons.

## 2014-01-13 DIAGNOSIS — Z510 Encounter for antineoplastic radiation therapy: Secondary | ICD-10-CM | POA: Diagnosis not present

## 2014-01-17 ENCOUNTER — Ambulatory Visit (HOSPITAL_BASED_OUTPATIENT_CLINIC_OR_DEPARTMENT_OTHER): Payer: BC Managed Care – PPO | Admitting: Hematology

## 2014-01-17 ENCOUNTER — Ambulatory Visit
Admission: RE | Admit: 2014-01-17 | Discharge: 2014-01-17 | Disposition: A | Discharge status: Home / Self Care | Payer: BC Managed Care – PPO | Source: Ambulatory Visit | Attending: Radiation Oncology | Admitting: Radiation Oncology

## 2014-01-17 ENCOUNTER — Other Ambulatory Visit (HOSPITAL_BASED_OUTPATIENT_CLINIC_OR_DEPARTMENT_OTHER): Payer: BC Managed Care – PPO

## 2014-01-17 VITALS — BP 165/73 | HR 81 | Temp 98.5°F | Resp 18 | Ht 68.0 in | Wt 208.5 lb

## 2014-01-17 DIAGNOSIS — C50919 Malignant neoplasm of unspecified site of unspecified female breast: Secondary | ICD-10-CM

## 2014-01-17 DIAGNOSIS — Z510 Encounter for antineoplastic radiation therapy: Secondary | ICD-10-CM | POA: Diagnosis not present

## 2014-01-17 DIAGNOSIS — E876 Hypokalemia: Secondary | ICD-10-CM

## 2014-01-17 DIAGNOSIS — C50512 Malignant neoplasm of lower-outer quadrant of left female breast: Secondary | ICD-10-CM

## 2014-01-17 DIAGNOSIS — T451X5A Adverse effect of antineoplastic and immunosuppressive drugs, initial encounter: Principal | ICD-10-CM

## 2014-01-17 DIAGNOSIS — Z170 Estrogen receptor positive status [ER+]: Secondary | ICD-10-CM

## 2014-01-17 DIAGNOSIS — D649 Anemia, unspecified: Secondary | ICD-10-CM

## 2014-01-17 DIAGNOSIS — D6481 Anemia due to antineoplastic chemotherapy: Secondary | ICD-10-CM

## 2014-01-17 DIAGNOSIS — R790 Abnormal level of blood mineral: Secondary | ICD-10-CM

## 2014-01-17 LAB — CBC WITH DIFFERENTIAL/PLATELET
BASO%: 0.7 % (ref 0.0–2.0)
BASOS ABS: 0 10*3/uL (ref 0.0–0.1)
EOS ABS: 0.1 10*3/uL (ref 0.0–0.5)
EOS%: 1 % (ref 0.0–7.0)
HCT: 33.8 % — ABNORMAL LOW (ref 34.8–46.6)
HEMOGLOBIN: 11 g/dL — AB (ref 11.6–15.9)
LYMPH#: 1.6 10*3/uL (ref 0.9–3.3)
LYMPH%: 28.9 % (ref 14.0–49.7)
MCH: 31 pg (ref 25.1–34.0)
MCHC: 32.6 g/dL (ref 31.5–36.0)
MCV: 95 fL (ref 79.5–101.0)
MONO#: 0.7 10*3/uL (ref 0.1–0.9)
MONO%: 12.2 % (ref 0.0–14.0)
NEUT%: 57.2 % (ref 38.4–76.8)
NEUTROS ABS: 3.1 10*3/uL (ref 1.5–6.5)
Platelets: 303 10*3/uL (ref 145–400)
RBC: 3.56 10*6/uL — AB (ref 3.70–5.45)
RDW: 16.2 % — AB (ref 11.2–14.5)
WBC: 5.4 10*3/uL (ref 3.9–10.3)

## 2014-01-17 LAB — MAGNESIUM (CC13): MAGNESIUM: 1.7 mg/dL (ref 1.5–2.5)

## 2014-01-17 LAB — COMPREHENSIVE METABOLIC PANEL (CC13)
ALBUMIN: 3.8 g/dL (ref 3.5–5.0)
ALK PHOS: 72 U/L (ref 40–150)
ALT: 17 U/L (ref 0–55)
ANION GAP: 9 meq/L (ref 3–11)
AST: 22 U/L (ref 5–34)
BILIRUBIN TOTAL: 0.56 mg/dL (ref 0.20–1.20)
BUN: 14.8 mg/dL (ref 7.0–26.0)
CO2: 27 meq/L (ref 22–29)
Calcium: 9.4 mg/dL (ref 8.4–10.4)
Chloride: 104 mEq/L (ref 98–109)
Creatinine: 0.7 mg/dL (ref 0.6–1.1)
GLUCOSE: 87 mg/dL (ref 70–140)
POTASSIUM: 3.6 meq/L (ref 3.5–5.1)
Sodium: 140 mEq/L (ref 136–145)
Total Protein: 6.6 g/dL (ref 6.4–8.3)

## 2014-01-17 LAB — HOLD TUBE, BLOOD BANK

## 2014-01-17 MED ORDER — ALRA NON-METALLIC DEODORANT (RAD-ONC)
1.0000 "application " | Freq: Once | TOPICAL | Status: AC
  Administered 2014-01-17: 1 via TOPICAL

## 2014-01-17 NOTE — Progress Notes (Signed)
[facility]  [phone] [phone]     ID: Lori Vincent OB: 11/07/1955  MR#: [id number]  CSN#:[id number]  PCP: CURTIS,CAROL G, NP GYN:  Richard Holland, MD SU: David Newman, MD OTHER MD: Robert Murray, MD  CHIEF COMPLAINT: Left breast cancer s/p completion of adjuvant chemotherapy for f/u. Anemia and Hypokalemia f/u.  PATIENT IDENTIFICATION:  Lori Vincent is 58 years old [facility] showing a possible mass in the left breast. Diagnostic left mammography and ultrasonography at the breast center 04/26/2013 confirmed an oval mass with microcalcifications in the 6:00 position of the left breast. There was an adjacent partially obscured 1.2 cm mass. On physical exam there was no palpable mass in the left breast. By ultrasonography, there was an oval mass at the 6:00 position measuring 1.4 cm, with a second mass measuring 1.1 cm. There were no abnormal lymph nodes noted in the left axilla.  Biopsy of the 6:00 mass in the left breast January 2015 showed ([id number]) and invasive ductal carcinoma, grade 3, estrogen receptor 97% positive, progesterone receptor 75% positive, with strong staining intensity, and MIB-144%. HER-2 was amplified, with the signals ratio 4.18 and in number per cell of 9.40 .  On 06/08/2013 the patient underwent bilateral breast MRI which showed a dumbbell-shaped area in the central left breast consisting of a 2 cm mass and 2 extensions or an adjacent masses, measuring a total of 2.9 cm. There were no abnormal appearing lymph nodes for any other areas of concern.    She is status post left lumpectomy and sentinel lymph node sampling 07-2013 with Dr Newman for a pT2 pN0, stage IIA invasive ductal carcinoma, grade 3, with negative margins.She started adjuvant chemotherapy, the plan being to treat with 6 q. three-week doses of docetaxel/carboplatin (AUC 5) given along with  trastuzumab/pertuzumab, first dose on 09/02/2013. Neulasta on day 2 for granulocyte support. Trastuzumab to continue to total one year. Adjuvant radiation to follow chemotherapy and adjuvant anti-estrogens to follow radiation.   Here is her chemo flow sheet and she got her cycle 6 chemo on 12/16/2013 and neulasta shot on 12/17/2013.  She was seen by Dr Murray in Rad/Onc. The plan discussed was any where 4-6 weeks of XRT starting on January 18 2014 so she starts her XRT tomorrow. We did labs today.   INTERVAL HISTORY:  Lori Vincent returns alone today for followup of her left breast cancer.she spend few days on one of the [place] beaches. Did not go in water as she sighted 2 sharks but enjoyed the sun and the sand and her family. She felt much better after getting the blood. She still have some bone pain and takes advil 5-6 x per day and the swelling in legs is minimal. Rest of the ROS is negative.  REVIEW OF SYSTEMS:  Lori Vincent has had no fevers, chills, or night sweats. She continues to have moderate fatigue, although her fatigue level certainly worsened after coming off the dexamethasone. She has had no abnormal bruising or bleeding. She has some taste aversion, but no problems with nausea or emesis. She still alternates somewhat between mild constipation and diarrhea.  She denies shortness of breath related to exertion, no chest pain, or palpitations. She's had no abnormal headaches or dizziness. Her vision continues to be blurry, especially when taking the dexamethasone. Currently, she denies any unusual myalgias, arthralgias, but have bony pain. A detailed review of systems is otherwise stable and noncontributory.   PAST MEDICAL   HISTORY: Past Medical History  Diagnosis Date  . Breast cancer 06/03/13    left, 6:00 o'clock  . Depression   . Hypothyroidism   . Hypokalemia   . Anemia due to chemotherapy     PAST SURGICAL HISTORY: Past Surgical History  Procedure Laterality Date  . Tonsillectomy  1972  .  Cesarean section  04/1999  . Breast biopsy  1/14    lt  . Dilation and curettage of uterus    . Colonoscopy  2013  . Breast lumpectomy with needle localization and axillary sentinel lymph node bx Left 07/26/2013    Procedure: BREAST LUMPECTOMY WITH NEEDLE LOCALIZATION AND AXILLARY SENTINEL LYMPH NODE BX;  Surgeon: Shann Medal, MD;  Location: [place];  Service: General;  Laterality: Left;  Marland Kitchen Mole removal Right 07/26/2013    Procedure: MOLE REMOVAL UNDER RIGHT BREAST;  Surgeon: Shann Medal, MD;  Location: [place] [facility];  Service: General;  Laterality: Right;  . Portacath placement Right 07/26/2013    Procedure: INSERTION PORT-A-CATH;  Surgeon: Shann Medal, MD;  Location: [facility];  Service: General;  Laterality: Right;    FAMILY HISTORY Family History  Problem Relation Age of Onset  . Stroke Mother   . Diabetes Father   . Cancer Maternal Grandmother 22    colon  The patient's father died at the age of 74 in a car accident. The patient's mother died at the age of 23 from a stroke. The patient has one adopted brother. She has one biological sister. The patient's mother's mother was diagnosed with colon cancer at the age of 69. There is no history of breast oropharynx cancer in the family to the patient's knowledge.  GYNECOLOGIC HISTORY:   Menarche age 39. First live birth age 89. The patient is GX P1. She took birth control pills for approximately 12 years remotely. She had a period in February of 2014 and then in December of 2014. She had been started on hormone replacement in December. That has since been discontinued.  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 Her husband died at [place] 3 years ago with soft tissue sarcoma. The patient's significant other also works as a Geophysicist/field seismologist. At home it is just the patient and her  daughter 46, 6 years old, who just finished middle school. The patient has 2 stepchildren from her earlier marriage. 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  ADVANCED DIRECTIVES: Not in place   HEALTH MAINTENANCE:  (Updated 11/15/2013) History  Substance Use Topics  . Smoking status: Never Smoker   . Smokeless tobacco: Never Used  . Alcohol Use: Yes     Comment: 5-7 glasses per week of red wine     Colonoscopy: July 2014  PAP: November 2014  Bone density: November 2014  Lipid panel: Not on file  Allergies  Allergen Reactions  . Compazine [Prochlorperazine] Other (See Comments)    Extreme restlessness and mild extrapyramidal movement;  Patient tolerates Reglan with no problems    Current Outpatient Prescriptions  Medication Sig Dispense Refill  . citalopram (CELEXA) 20 MG tablet Take 1 tablet (20 mg total) by mouth daily.  30 tablet  5  . levothyroxine (SYNTHROID, LEVOTHROID) 112 MCG tablet Take 112 mcg by mouth daily before breakfast.  Take sat and sunday      . lidocaine-prilocaine (EMLA) cream Apply 1 application topically as needed. 1-2 hr before each port access  30 g  5  . LORazepam (ATIVAN) 0.5 MG tablet Take 1 tablet (0.5 mg total) by mouth 2 (two) times daily.  60 tablet  0  . potassium chloride SA (K-DUR,KLOR-CON) 20 MEQ tablet Take 1 tablet (20 mEq total) by mouth daily.  30 tablet  2   No current facility-administered medications for this visit.    OBJECTIVE: Middle-aged white woman who appears tired but is in no acute distress Filed Vitals:   01/17/14 1200  BP: 165/73  Pulse: 81  Temp: 98.5 F (36.9 C)  Resp: 18   Body mass index is 31.71 kg/(m^2).    ECOG FS: 1 Filed Weights   01/17/14 1200  Weight: 208 lb 8 oz (94.575 kg)   Physical Exam: HEENT:  Sclerae anicteric.  Oropharynx clear and moist. No ulcerationsNo evidence of mucositis or oropharyngeal  candidiasis. + arcus senilus  Neck supple, trachea midline.  NODES:  No cervical or supraclavicular lymphadenopathy palpated.  BREAST EXAM:  Breast exam deferred. Axillae are benign bilaterally, with no palpable lymphadenopathy. LUNGS:  Clear to auscultation bilaterally with good excursion.  No crackles, wheezes or rhonchi HEART:  Regular rate and rhythm. Flow murmur from anemia. ABDOMEN:  Soft, nondistended, nontender.  No organomegaly or masses palpated. Positive bowel sounds.  MSK:  No focal spinal tenderness to palpation. Good range of motion bilaterally in the upper extremities. EXTREMITIES:  1 + pedal edema. No lymphedema in the left upper extremity. SKIN:  No visible rash is today.  No visible nail dyscrasia. No excessive ecchymoses. No petechiae. No pallor. NEURO:  Nonfocal. Well oriented. Appropriate affect.   LAB RESULTS:  CBC    CMP      STUDIES: ------------------------------------------------------------------- Transthoracic Echocardiography 12/28/2013  ------------------------------------------------------------------ LV EF: 60% - 65%  Study Conclusions  - Left ventricle: The cavity size was normal. Wall thickness was normal. Systolic function was normal. The estimated ejection fraction was in the range of 60% to 65%. Lateral S&' 9.9 cm/sec.Global longitudinal strain -19.5%. Wall motion was normal; there were no regional wall motion abnormalities. Doppler parameters are consistent with abnormal left ventricular relaxation (grade 1 diastolic dysfunction).- Aortic valve: There was no stenosis.- Mitral valve: There was trivial regurgitation.- Right ventricle: The cavity size was normal. Systolic function was normal.- Tricuspid valve: Peak RV-RA gradient (S): 11 mm Hg.- Pulmonary arteries: PA peak pressure: 14 mm Hg (S).- Inferior vena cava: The vessel was normal in size. Therespirophasic diameter changes were in the normal range (>= 50%),consistent with normal central  venous pressure.  Impressions:  - Normal LV size and systolic function, EF 27-78%. Normal RV sizeand systolic function. No significant valvular abnormalities.Strain and lateral S&' as above     ASSESSMENT: 58 y.o. [place] woman, status post left breast biopsy 06/03/2013 for a clinical T2 N0, stage IIA invasive ductal carcinoma, grade 3, estrogen and progesterone receptor positive, with an MIB-1 of 44%, and HER-2 amplified.  (1) status post left lumpectomy and sentinel lymph node sampling 07-2013 for a pT2 pN0, stage IIA invasive ductal carcinoma, grade 3, with negative margins.  (2) being treated in the adjuvant setting, the plan being to treat with 6 q. three-week doses of docetaxel/carboplatin (AUC 5) given along with trastuzumab/pertuzumab, first dose on 09/02/2013. Neulasta on day 2 for granulocyte support. Last chemo given on 12/16/13 and neulasta on 12/17/13.  (3) trastuzumab to continue  to total one year  (4) adjuvant radiation to follow chemotherapy  (5) adjuvant anti-estrogens to follow radiation  (6)  Depression/anxiety - on Celexa, 20 mg  (7) The major issue today is symptomatic anemia with a hemoglobin of 6.9 grams. Her platelets have recovered. She is planning a trip to [place] beach next week.   PLAN:  1. Patient have completed 6 cycles of TCH+P. Cycle 6 given on 12/16/2013.   2. She saw Dr Murray in Rad/onc and finalized adjuvant XRT plans which starts tomorrow.  3.  TODAY HER HEMOGLOBIN IS MUCH BETTER AT 11.0 GRAMS so no need for transfusion and I believe her bone marrow function is recovering post chemotherapy.  4. Hypokalemia resolved with K of 3.6 up from 3.4. I told her to continue to take 60 meq of KCL daily and we will check her labs again on 01/24/2014.  5. Her Echocardiogram shows normal Cardiac EF.  6 .We will follow weekly CBC,pink top,BMP for next 3 weeks and if her hemoglobin and electrolytes remain stable at that point switch her labs to q 3  weeks.  Aasim Sehbai, MD Medical Hematologist/Oncologist [facility] Pager: [phone] Office No: [phone]   

## 2014-01-17 NOTE — Progress Notes (Signed)
Simulation verification note: The patient underwent simulation verification today for treatment to her left breast.  Her isocenter is in good position and the multileaf collimators contoured the treatment volume appropriately. 

## 2014-01-18 ENCOUNTER — Ambulatory Visit: Payer: BC Managed Care – PPO

## 2014-01-18 ENCOUNTER — Ambulatory Visit
Admission: RE | Admit: 2014-01-18 | Discharge: 2014-01-18 | Disposition: A | Discharge status: Home / Self Care | Payer: BC Managed Care – PPO | Source: Ambulatory Visit | Attending: Radiation Oncology | Admitting: Radiation Oncology

## 2014-01-18 ENCOUNTER — Telehealth: Payer: Self-pay | Admitting: *Deleted

## 2014-01-18 DIAGNOSIS — Z510 Encounter for antineoplastic radiation therapy: Secondary | ICD-10-CM | POA: Diagnosis not present

## 2014-01-18 DIAGNOSIS — C50512 Malignant neoplasm of lower-outer quadrant of left female breast: Secondary | ICD-10-CM

## 2014-01-18 MED ORDER — RADIAPLEXRX EX GEL
Freq: Once | CUTANEOUS | Status: AC
Start: 2014-01-18 — End: 2014-01-18
  Administered 2014-01-18: 11:00:00 via TOPICAL

## 2014-01-18 MED ORDER — ALRA NON-METALLIC DEODORANT (RAD-ONC)
1.0000 "application " | Freq: Once | TOPICAL | Status: AC
  Administered 2014-01-18: 1 via TOPICAL

## 2014-01-18 NOTE — Progress Notes (Signed)
Patient education completed with patient. Gave pt "Radiation and You" booklet w/all pertinent information marked and discussed, re: fatigue, skin irritation/management, nutrition, pain. Gave pt Radiaplex, [place] with instructions for proper use. All questions answered. Pt verbalized understanding.

## 2014-01-18 NOTE — Telephone Encounter (Signed)
Per Dr.Sehbai, I informed patient that her potassium level was 3.6. Continue taking potasium daily. Patient verbalized understanding.

## 2014-01-19 ENCOUNTER — Ambulatory Visit
Admission: RE | Admit: 2014-01-19 | Discharge: 2014-01-19 | Disposition: A | Discharge status: Home / Self Care | Payer: BC Managed Care – PPO | Source: Ambulatory Visit | Attending: Radiation Oncology | Admitting: Radiation Oncology

## 2014-01-19 DIAGNOSIS — Z510 Encounter for antineoplastic radiation therapy: Secondary | ICD-10-CM | POA: Diagnosis not present

## 2014-01-20 ENCOUNTER — Ambulatory Visit
Admission: RE | Admit: 2014-01-20 | Discharge: 2014-01-20 | Disposition: A | Discharge status: Home / Self Care | Payer: BC Managed Care – PPO | Source: Ambulatory Visit | Attending: Radiation Oncology | Admitting: Radiation Oncology

## 2014-01-20 DIAGNOSIS — Z510 Encounter for antineoplastic radiation therapy: Secondary | ICD-10-CM | POA: Diagnosis not present

## 2014-01-21 ENCOUNTER — Other Ambulatory Visit: Payer: Self-pay | Admitting: *Deleted

## 2014-01-21 ENCOUNTER — Ambulatory Visit
Admission: RE | Admit: 2014-01-21 | Discharge: 2014-01-21 | Disposition: A | Discharge status: Home / Self Care | Payer: BC Managed Care – PPO | Source: Ambulatory Visit | Attending: Radiation Oncology | Admitting: Radiation Oncology

## 2014-01-21 ENCOUNTER — Telehealth: Payer: Self-pay | Admitting: Oncology

## 2014-01-21 DIAGNOSIS — C50919 Malignant neoplasm of unspecified site of unspecified female breast: Secondary | ICD-10-CM

## 2014-01-21 DIAGNOSIS — Z510 Encounter for antineoplastic radiation therapy: Secondary | ICD-10-CM | POA: Diagnosis not present

## 2014-01-21 DIAGNOSIS — E876 Hypokalemia: Secondary | ICD-10-CM

## 2014-01-21 MED ORDER — POTASSIUM CHLORIDE CRYS ER 20 MEQ PO TBCR: 20.0000 meq | EXTENDED_RELEASE_TABLET | Freq: Three times a day (TID) | ORAL | Status: DC

## 2014-01-21 NOTE — Telephone Encounter (Signed)
, °

## 2014-01-24 ENCOUNTER — Encounter: Payer: Self-pay | Admitting: Radiation Oncology

## 2014-01-24 ENCOUNTER — Ambulatory Visit
Admission: RE | Admit: 2014-01-24 | Discharge: 2014-01-24 | Disposition: A | Discharge status: Home / Self Care | Payer: BC Managed Care – PPO | Source: Ambulatory Visit | Attending: Radiation Oncology | Admitting: Radiation Oncology

## 2014-01-24 ENCOUNTER — Other Ambulatory Visit (HOSPITAL_BASED_OUTPATIENT_CLINIC_OR_DEPARTMENT_OTHER): Payer: BC Managed Care – PPO

## 2014-01-24 ENCOUNTER — Other Ambulatory Visit: Payer: BC Managed Care – PPO

## 2014-01-24 VITALS — BP 143/69 | HR 84 | Temp 98.0°F | Resp 20 | Wt 210.8 lb

## 2014-01-24 DIAGNOSIS — C50919 Malignant neoplasm of unspecified site of unspecified female breast: Secondary | ICD-10-CM

## 2014-01-24 DIAGNOSIS — C50512 Malignant neoplasm of lower-outer quadrant of left female breast: Secondary | ICD-10-CM

## 2014-01-24 DIAGNOSIS — Z510 Encounter for antineoplastic radiation therapy: Secondary | ICD-10-CM | POA: Diagnosis not present

## 2014-01-24 LAB — CBC WITH DIFFERENTIAL/PLATELET
BASO%: 0.9 % (ref 0.0–2.0)
Basophils Absolute: 0.1 10*3/uL (ref 0.0–0.1)
EOS ABS: 0.4 10*3/uL (ref 0.0–0.5)
EOS%: 6.8 % (ref 0.0–7.0)
HCT: 34 % — ABNORMAL LOW (ref 34.8–46.6)
HGB: 10.8 g/dL — ABNORMAL LOW (ref 11.6–15.9)
LYMPH%: 23.4 % (ref 14.0–49.7)
MCH: 30.8 pg (ref 25.1–34.0)
MCHC: 31.8 g/dL (ref 31.5–36.0)
MCV: 96.9 fL (ref 79.5–101.0)
MONO#: 0.5 10*3/uL (ref 0.1–0.9)
MONO%: 9.6 % (ref 0.0–14.0)
NEUT#: 3.2 10*3/uL (ref 1.5–6.5)
NEUT%: 59.3 % (ref 38.4–76.8)
Platelets: 213 10*3/uL (ref 145–400)
RBC: 3.51 10*6/uL — AB (ref 3.70–5.45)
RDW: 15.5 % — AB (ref 11.2–14.5)
WBC: 5.3 10*3/uL (ref 3.9–10.3)
lymph#: 1.2 10*3/uL (ref 0.9–3.3)

## 2014-01-24 LAB — BASIC METABOLIC PANEL (CC13)
Anion Gap: 9 mEq/L (ref 3–11)
BUN: 13.5 mg/dL (ref 7.0–26.0)
CALCIUM: 9.4 mg/dL (ref 8.4–10.4)
CO2: 27 meq/L (ref 22–29)
CREATININE: 0.8 mg/dL (ref 0.6–1.1)
Chloride: 107 mEq/L (ref 98–109)
Glucose: 89 mg/dl (ref 70–140)
Potassium: 3.9 mEq/L (ref 3.5–5.1)
SODIUM: 142 meq/L (ref 136–145)

## 2014-01-24 LAB — HOLD TUBE, BLOOD BANK

## 2014-01-24 NOTE — Progress Notes (Signed)
weekly rad txs 5 tx left breast,mild erythema,skin intact, tingling/stinging unser left axilla,m numbness feet,puffy, ,pain  In up her calves radiaplex bid, had 2 units Prbc's last week , increase of her Potassium to tid , appetite good, energy level better 8:59 AM

## 2014-01-24 NOTE — Progress Notes (Signed)
Weekly Management Note:  Site: Left breast Current Dose:  900  cGy Projected Dose: 4680  cGy  Narrative: The patient is seen today for routine under treatment assessment. CBCT/MVCT images/port films were reviewed. The chart was reviewed.   She is without complaints today except for some "tingling" in left axilla which she had previously. This is probably related to her sentinel lymph node biopsy. She has Radioplex gel to use when necessary.  Physical Examination:  Filed Vitals:   01/24/14 0856  BP: 143/69  Pulse: 84  Temp: 98 F (36.7 C)  Resp: 20  .  Weight: 210 lb 12.8 oz (95.618 kg). No significant skin changes.  Impression: Tolerating radiation therapy well.  Plan: Continue radiation therapy as planned.

## 2014-01-25 ENCOUNTER — Ambulatory Visit
Admission: RE | Admit: 2014-01-25 | Discharge: 2014-01-25 | Disposition: A | Discharge status: Home / Self Care | Payer: BC Managed Care – PPO | Source: Ambulatory Visit | Attending: Radiation Oncology | Admitting: Radiation Oncology

## 2014-01-25 DIAGNOSIS — Z510 Encounter for antineoplastic radiation therapy: Secondary | ICD-10-CM | POA: Diagnosis not present

## 2014-01-26 ENCOUNTER — Ambulatory Visit
Admission: RE | Admit: 2014-01-26 | Discharge: 2014-01-26 | Disposition: A | Discharge status: Home / Self Care | Payer: BC Managed Care – PPO | Source: Ambulatory Visit | Attending: Radiation Oncology | Admitting: Radiation Oncology

## 2014-01-26 DIAGNOSIS — Z510 Encounter for antineoplastic radiation therapy: Secondary | ICD-10-CM | POA: Diagnosis not present

## 2014-01-27 ENCOUNTER — Telehealth: Payer: Self-pay | Admitting: *Deleted

## 2014-01-27 ENCOUNTER — Other Ambulatory Visit (HOSPITAL_BASED_OUTPATIENT_CLINIC_OR_DEPARTMENT_OTHER): Payer: BC Managed Care – PPO

## 2014-01-27 ENCOUNTER — Ambulatory Visit (HOSPITAL_BASED_OUTPATIENT_CLINIC_OR_DEPARTMENT_OTHER): Payer: BC Managed Care – PPO | Admitting: Oncology

## 2014-01-27 ENCOUNTER — Ambulatory Visit (HOSPITAL_BASED_OUTPATIENT_CLINIC_OR_DEPARTMENT_OTHER): Payer: BC Managed Care – PPO

## 2014-01-27 ENCOUNTER — Ambulatory Visit
Admission: RE | Admit: 2014-01-27 | Discharge: 2014-01-27 | Disposition: A | Discharge status: Home / Self Care | Payer: BC Managed Care – PPO | Source: Ambulatory Visit | Attending: Radiation Oncology | Admitting: Radiation Oncology

## 2014-01-27 ENCOUNTER — Telehealth: Payer: Self-pay | Admitting: Oncology

## 2014-01-27 VITALS — BP 138/65 | HR 80 | Temp 98.5°F | Resp 18 | Ht 68.0 in | Wt 207.8 lb

## 2014-01-27 DIAGNOSIS — C50919 Malignant neoplasm of unspecified site of unspecified female breast: Secondary | ICD-10-CM

## 2014-01-27 DIAGNOSIS — T451X5A Adverse effect of antineoplastic and immunosuppressive drugs, initial encounter: Secondary | ICD-10-CM

## 2014-01-27 DIAGNOSIS — Z510 Encounter for antineoplastic radiation therapy: Secondary | ICD-10-CM | POA: Diagnosis not present

## 2014-01-27 DIAGNOSIS — C50512 Malignant neoplasm of lower-outer quadrant of left female breast: Secondary | ICD-10-CM

## 2014-01-27 DIAGNOSIS — G5790 Unspecified mononeuropathy of unspecified lower limb: Secondary | ICD-10-CM

## 2014-01-27 DIAGNOSIS — F341 Dysthymic disorder: Secondary | ICD-10-CM

## 2014-01-27 DIAGNOSIS — Z5112 Encounter for antineoplastic immunotherapy: Secondary | ICD-10-CM

## 2014-01-27 DIAGNOSIS — D6481 Anemia due to antineoplastic chemotherapy: Secondary | ICD-10-CM

## 2014-01-27 DIAGNOSIS — Z170 Estrogen receptor positive status [ER+]: Secondary | ICD-10-CM

## 2014-01-27 LAB — CBC WITH DIFFERENTIAL/PLATELET
BASO%: 1.3 % (ref 0.0–2.0)
Basophils Absolute: 0.1 10*3/uL (ref 0.0–0.1)
EOS ABS: 0.7 10*3/uL — AB (ref 0.0–0.5)
EOS%: 11 % — ABNORMAL HIGH (ref 0.0–7.0)
HCT: 34.2 % — ABNORMAL LOW (ref 34.8–46.6)
HEMOGLOBIN: 11.1 g/dL — AB (ref 11.6–15.9)
LYMPH%: 19.6 % (ref 14.0–49.7)
MCH: 31 pg (ref 25.1–34.0)
MCHC: 32.6 g/dL (ref 31.5–36.0)
MCV: 95.2 fL (ref 79.5–101.0)
MONO#: 0.6 10*3/uL (ref 0.1–0.9)
MONO%: 9.4 % (ref 0.0–14.0)
NEUT%: 58.7 % (ref 38.4–76.8)
NEUTROS ABS: 3.6 10*3/uL (ref 1.5–6.5)
Platelets: 225 10*3/uL (ref 145–400)
RBC: 3.59 10*6/uL — ABNORMAL LOW (ref 3.70–5.45)
RDW: 15 % — AB (ref 11.2–14.5)
WBC: 6.2 10*3/uL (ref 3.9–10.3)
lymph#: 1.2 10*3/uL (ref 0.9–3.3)

## 2014-01-27 LAB — COMPREHENSIVE METABOLIC PANEL (CC13)
ALK PHOS: 76 U/L (ref 40–150)
ALT: 21 U/L (ref 0–55)
AST: 19 U/L (ref 5–34)
Albumin: 3.6 g/dL (ref 3.5–5.0)
Anion Gap: 9 mEq/L (ref 3–11)
BUN: 12.9 mg/dL (ref 7.0–26.0)
CALCIUM: 9.4 mg/dL (ref 8.4–10.4)
CO2: 26 mEq/L (ref 22–29)
Chloride: 106 mEq/L (ref 98–109)
Creatinine: 0.8 mg/dL (ref 0.6–1.1)
GLUCOSE: 98 mg/dL (ref 70–140)
POTASSIUM: 3.8 meq/L (ref 3.5–5.1)
Sodium: 142 mEq/L (ref 136–145)
TOTAL PROTEIN: 6.5 g/dL (ref 6.4–8.3)
Total Bilirubin: 0.46 mg/dL (ref 0.20–1.20)

## 2014-01-27 LAB — MAGNESIUM (CC13): MAGNESIUM: 1.9 mg/dL (ref 1.5–2.5)

## 2014-01-27 MED ORDER — ACETAMINOPHEN 325 MG PO TABS
650.0000 mg | ORAL_TABLET | Freq: Once | ORAL | Status: AC
  Administered 2014-01-27: 650 mg via ORAL

## 2014-01-27 MED ORDER — LIDOCAINE-PRILOCAINE 2.5-2.5 % EX CREA
TOPICAL_CREAM | CUTANEOUS | Status: AC
  Filled 2014-01-27: qty 5

## 2014-01-27 MED ORDER — ACETAMINOPHEN 325 MG PO TABS
ORAL_TABLET | ORAL | Status: AC
  Filled 2014-01-27: qty 2

## 2014-01-27 MED ORDER — INFLUENZA VAC SPLIT QUAD 0.5 ML IM SUSY
0.5000 mL | PREFILLED_SYRINGE | Freq: Once | INTRAMUSCULAR | Status: DC
  Filled 2014-01-27: qty 0.5

## 2014-01-27 MED ORDER — TRASTUZUMAB CHEMO INJECTION 440 MG
6.0000 mg/kg | Freq: Once | INTRAVENOUS | Status: AC
  Administered 2014-01-27: 567 mg via INTRAVENOUS
  Filled 2014-01-27: qty 27

## 2014-01-27 MED ORDER — DIPHENHYDRAMINE HCL 25 MG PO CAPS
ORAL_CAPSULE | ORAL | Status: AC
  Filled 2014-01-27: qty 1

## 2014-01-27 MED ORDER — DIPHENHYDRAMINE HCL 25 MG PO CAPS
25.0000 mg | ORAL_CAPSULE | Freq: Once | ORAL | Status: AC
  Administered 2014-01-27: 25 mg via ORAL

## 2014-01-27 MED ORDER — HEPARIN SOD (PORK) LOCK FLUSH 100 UNIT/ML IV SOLN
500.0000 [IU] | Freq: Once | INTRAVENOUS | Status: AC | PRN
  Administered 2014-01-27: 500 [IU]
  Filled 2014-01-27: qty 5

## 2014-01-27 MED ORDER — SODIUM CHLORIDE 0.9 % IV SOLN
Freq: Once | INTRAVENOUS | Status: AC
  Administered 2014-01-27: 10:00:00 via INTRAVENOUS

## 2014-01-27 MED ORDER — SODIUM CHLORIDE 0.9 % IJ SOLN
10.0000 mL | INTRAMUSCULAR | Status: DC | PRN
  Administered 2014-01-27: 10 mL
  Filled 2014-01-27: qty 10

## 2014-01-27 NOTE — Progress Notes (Signed)
[facility]  [phone]) [phone] Fax:(336) [phone]     ID: [name] OB: 1955/12/24  MR#: [id number]  HYI#:[id number]  PCP: [name], NP GYN:  [name], MD SU: [name], MD OTHER MD: Arloa Koh, MD  CHIEF COMPLAINT: estrogen receptor positive breast cancer CURRENT TREATMENT:adjuvant chemotherapy  HISTORY OF PRESENT ILLNESS: From the original intake note:  K[facility]ia [facility] showing a possible mass in the left breast. Diagnostic left mammography and ultrasonography at the breast center 04/26/2013 confirmed an oval mass with microcalcifications in the 6:00 position of the left breast. There was an adjacent partially obscured 1.2 cm mass. On physical exam there was no palpable mass in the left breast. By ultrasonography, there was an oval mass at the 6:00 position measuring 1.4 cm, with a second mass measuring 1.1 cm. There were no abnormal lymph nodes noted in the left axilla.  Biopsy of the 6:00 mass in the left breast January 2015 showed ([id number]) and invasive ductal carcinoma, grade 3, estrogen receptor 97% positive, progesterone receptor 75% positive, with strong staining intensity, and MIB-144%. HER-2 was amplified, with the signals ratio 4.18 and in number per cell of 9.40 .  On 06/08/2013 the patient underwent bilateral breast MRI which showed a dumbbell-shaped area in the central left breast consisting of a 2 cm mass and 2 extensions or an adjacent masses, measuring a total of 2.9 cm. There were no abnormal appearing lymph nodes for any other areas of concern.  The patient's subsequent history is as detailed below.   INTERVAL HISTORY: Raffaella comes today for followup of her breast cancer. Since her last visit with me she completed her chemotherapy, and has started her radiation treatments. She continues on trastuzumab every 3 weeks and will receive a dose today. Her most recent  echocardiogram, less than a month ago, showed a well preserved ejection fraction  REVIEW OF SYSTEMS: Selby continues to have significant problems with her toenails and is about to lose her up to big toe nails. She has persistent although grade 1 neuropathy in both her feet, involving the toes. In addition she had significant swelling in both lower extremities. This has largely resolved, but she still very tender over the calves and shins, so that is touching that scan can be unpleasant. There has been no erythema. She has also lost her eyebrows and eyelashes. She is having some vaginal dryness problems. She is tolerating radiation well so far, except for mild erythema. Unfortunately she has had to give up some work because of the timing of the radiation. She is going to be discussing that with Dr. [name] at her next visit. A detailed review of systems today was otherwise stable.  PAST MEDICAL HISTORY: Past Medical History  Diagnosis Date  . Breast cancer 06/03/13    left, 6:00 o'clock  . Depression   . Hypothyroidism   . Hypokalemia   . Anemia due to chemotherapy     PAST SURGICAL HISTORY: Past Surgical History  Procedure Laterality Date  . Tonsillectomy  1972  . Cesarean section  04/1999  . Breast biopsy  1/14    lt  . Dilation and curettage of uterus    . Colonoscopy  2013  . Breast lumpectomy with needle localization and axillary sentinel lymph node bx Left 07/26/2013    Procedure: BREAST LUMPECTOMY WITH NEEDLE LOCALIZATION AND AXILLARY SENTINEL LYMPH NODE BX;  Surgeon: Shann Medal, MD;  Location: [facility];  Service: General;  Laterality: Left;  Marland Kitchen Mole removal Right 07/26/2013    Procedure: MOLE REMOVAL UNDER RIGHT BREAST;  Surgeon: Shann Medal, MD;  Location: [facility];  Service: General;  Laterality: Right;  . Portacath placement Right 07/26/2013    Procedure: INSERTION PORT-A-CATH;  Surgeon: Shann Medal, MD;  Location: [facility];   Service: General;  Laterality: Right;    FAMILY HISTORY Family History  Problem Relation Age of Onset  . Stroke Mother   . Diabetes Father   . Cancer Maternal Grandmother 44    colon   the patient's father died at the age of 42 in a car accident. The patient's mother died at the age of 59 from a stroke. The patient has one adopted brother. She has one biological sister. The patient's mother's mother was diagnosed with colon cancer at the age of 31. There is no history of breast oropharynx cancer in the family to the patient's knowledge.  She asked about her daughter needing testing for brca and i said no.  GYNECOLOGIC HISTORY:   (Reviewed 11/15/2013) Menarche age 89. First live birth age 20. The patient is GX P1. She took birth control pills for approximately 12 years remotely. She had a period in February of 2014 and then in December of 2014. She had been started on hormone replacement in December. That has since been discontinued.  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 Her husband died at [place] 3 years ago with soft tissue sarcoma. The patient's significant other also works as a Geophysicist/field seismologist. At home it is just the patient and her daughter 90, 78 years old, who just finished middle school. The patient has 2 stepchildren from her earlier marriage. [name] is a Games developer in [place] (masc.) studies criminal Justice.  ADVANCED DIRECTIVES: Not in place   HEALTH MAINTENANCE:  (Updated 11/15/2013) History  Substance Use Topics  . Smoking status: Never Smoker   . Smokeless tobacco: Never Used  . Alcohol Use: Yes     Comment: 5-7 glasses per week of red wine     Colonoscopy: July 2014  PAP: November 2014  Bone density: November 2014  Lipid panel: Not on file  Allergies  Allergen Reactions  . Compazine [Prochlorperazine] Other  (See Comments)    Extreme restlessness and mild extrapyramidal movement;  Patient tolerates Reglan with no problems    Current Outpatient Prescriptions  Medication Sig Dispense Refill  . citalopram (CELEXA) 20 MG tablet Take 1 tablet (20 mg total) by mouth daily.  30 tablet  5  . levothyroxine (SYNTHROID, LEVOTHROID) 112 MCG tablet Take 112 mcg by mouth daily before breakfast. Take sat and sunday      . lidocaine-prilocaine (EMLA) cream Apply 1 application topically as needed. 1-2 hr before each port access  30 g  5  . LORazepam (ATIVAN) 0.5 MG tablet Take 1 tablet (0.5 mg total) by mouth 2 (two) times daily.  60 tablet  0  . potassium chloride SA (K-DUR,KLOR-CON) 20 MEQ tablet Take 1 tablet (20 mEq total) by mouth 3 (three) times daily.  90 tablet  2  . Wound Dressings (RADIAGEL) GEL Apply topically 2 (two) times daily.       No current facility-administered medications for this visit.    OBJECTIVE: Middle-aged white woman who appears stated age 58 Vitals:  01/27/14 0855  BP: 138/65  Pulse: 80  Temp: 98.5 F (36.9 C)  Resp: 18   Body mass index is 31.6 kg/(m^2).    ECOG FS: 1 Filed Weights   01/27/14 0855  Weight: 207 lb 12.8 oz (94.257 kg)   Sclerae unicteric, EOMs intact Oropharynx clear and moist No cervical or supraclavicular adenopathy Lungs no rales or rhonchi Heart regular rate and rhythm Abd soft, nontender, positive bowel sounds MSK no focal spinal tenderness, no upper extremity lymphedema, no edema or erythema lower legs Neuro: nonfocal, well oriented, positive affect Breasts: The right breast is unremarkable. The left breast is status post lumpectomy. The incisions have healed very nicely. There is very minimal erythema over the radiation port area Skin: She has significant nail discoloration, including Beau's lines, and some nails are becoming detached  LAB RESULTS: CBC    Component Value Date/Time   WBC 6.2 01/27/2014 0811   RBC 3.59* 01/27/2014 0811   HGB  11.1* 01/27/2014 0811   HCT 34.2* 01/27/2014 0811   PLT 225 01/27/2014 0811   MCV 95.2 01/27/2014 0811   MCH 31.0 01/27/2014 0811   MCHC 32.6 01/27/2014 0811   RDW 15.0* 01/27/2014 0811   LYMPHSABS 1.2 01/27/2014 0811   MONOABS 0.6 01/27/2014 0811   EOSABS 0.7* 01/27/2014 0811   BASOSABS 0.1 01/27/2014 0811    CMP     Component Value Date/Time   NA 142 01/27/2014 0811   K 3.8 01/27/2014 0811   CO2 26 01/27/2014 0811   GLUCOSE 98 01/27/2014 0811   BUN 12.9 01/27/2014 0811   CREATININE 0.8 01/27/2014 0811   CALCIUM 9.4 01/27/2014 0811   PROT 6.5 01/27/2014 0811   ALBUMIN 3.6 01/27/2014 0811   AST 19 01/27/2014 0811   ALT 21 01/27/2014 0811   ALKPHOS 76 01/27/2014 0811   BILITOT 0.46 01/27/2014 0811      STUDIES:  *[facility]* *[facility] [address], [place] [address] [phone]  ------------------------------------------------------------------- Transthoracic Echocardiography  Patient: Madalyne, Husk MR #: [id number] Study Date: 12/28/2013 Gender: F Age: 3 Height: 172.7 cm Weight: 89.8 kg BSA: 2.1 m^2 Pt. Status: Room:  ORDERING Magrinat, Virgie Dad PERFORMING [facility], Outpatient SONOGRAPHER Darlina Sicilian, RDCS  cc:  ------------------------------------------------------------------- LV EF: 60% - 65%   ASSESSMENT: 58 y.o. [place] woman, status post left breast biopsy 06/03/2013 for a clinical T2 N0, stage IIA invasive ductal carcinoma, grade 3, estrogen and progesterone receptor positive, with an MIB-1 of 44%, and HER-2 amplified.  (1) status post left lumpectomy and sentinel lymph node sampling 07/26/2013 for a pT2 pN0, stage IIA invasive ductal carcinoma, grade 3, with negative margins.  (2) treated in the adjuvant setting with docetaxel/carboplatin (AUC 5) given along with trastuzumab/pertuzumab x6 cycles, first dose on 09/02/2013, last dose 12/16/2013  (3) trastuzumab to continue through March 2016; most recent echo 12/28/2013 showed an excellent ejection  fraction  (4) adjuvant radiation to be completed 03/02/2014  (5) adjuvant anti-estrogens to follow radiation  (6)  Depression/anxiety - on Celexa, 20 mg  (7) chemotherapy-induced anemia    PLAN: Alizae is recovering from her chemotherapy. She is likely to lose some nails, but she understands there will grow back completely normal over the next several months. She has largely resolved the lower extremity edema secondary to the Taxotere, but still has some hypersensitivity over the lower legs. I expect that to resolve over the next 3 weeks or so she understands this is not an infection but a reaction  to the chemotherapy.  She is already having some vaginal dryness issues and I gave her information on our "intimacy and pelvic health" program. I strongly encouraged her to begin pelvic muscle massage.  She will complete her radiation treatments in early October. She will see me the first week in November. At that time we will likely start tamoxifen. She knows to call for any problems that may develop before her next visit here.  Chauncey Cruel, MD   01/27/2014 9:13 AM

## 2014-01-27 NOTE — Telephone Encounter (Signed)
, °

## 2014-01-27 NOTE — Telephone Encounter (Signed)
Per staff message and POF I have scheduled appts. Advised scheduler of appts. JMW  

## 2014-01-27 NOTE — Patient Instructions (Signed)

## 2014-01-28 ENCOUNTER — Ambulatory Visit
Admission: RE | Admit: 2014-01-28 | Discharge: 2014-01-28 | Disposition: A | Discharge status: Home / Self Care | Payer: BC Managed Care – PPO | Source: Ambulatory Visit | Attending: Radiation Oncology | Admitting: Radiation Oncology

## 2014-01-28 ENCOUNTER — Other Ambulatory Visit: Payer: Self-pay | Admitting: Oncology

## 2014-01-28 DIAGNOSIS — Z510 Encounter for antineoplastic radiation therapy: Secondary | ICD-10-CM | POA: Diagnosis not present

## 2014-02-01 ENCOUNTER — Ambulatory Visit
Admission: RE | Admit: 2014-02-01 | Discharge: 2014-02-01 | Disposition: A | Discharge status: Home / Self Care | Payer: BC Managed Care – PPO | Source: Ambulatory Visit | Attending: Radiation Oncology | Admitting: Radiation Oncology

## 2014-02-01 ENCOUNTER — Telehealth: Payer: Self-pay | Admitting: Oncology

## 2014-02-01 ENCOUNTER — Other Ambulatory Visit (HOSPITAL_BASED_OUTPATIENT_CLINIC_OR_DEPARTMENT_OTHER): Payer: BC Managed Care – PPO

## 2014-02-01 ENCOUNTER — Telehealth: Payer: Self-pay | Admitting: *Deleted

## 2014-02-01 VITALS — BP 133/66 | HR 78 | Temp 99.1°F | Resp 20 | Wt 208.7 lb

## 2014-02-01 DIAGNOSIS — C50512 Malignant neoplasm of lower-outer quadrant of left female breast: Secondary | ICD-10-CM

## 2014-02-01 DIAGNOSIS — Z510 Encounter for antineoplastic radiation therapy: Secondary | ICD-10-CM | POA: Diagnosis not present

## 2014-02-01 DIAGNOSIS — C50919 Malignant neoplasm of unspecified site of unspecified female breast: Secondary | ICD-10-CM

## 2014-02-01 LAB — CBC WITH DIFFERENTIAL/PLATELET
BASO%: 0.7 % (ref 0.0–2.0)
Basophils Absolute: 0 10*3/uL (ref 0.0–0.1)
EOS%: 14.7 % — AB (ref 0.0–7.0)
Eosinophils Absolute: 0.9 10*3/uL — ABNORMAL HIGH (ref 0.0–0.5)
HEMATOCRIT: 34.9 % (ref 34.8–46.6)
HGB: 11.1 g/dL — ABNORMAL LOW (ref 11.6–15.9)
LYMPH#: 1.2 10*3/uL (ref 0.9–3.3)
LYMPH%: 18.8 % (ref 14.0–49.7)
MCH: 30.7 pg (ref 25.1–34.0)
MCHC: 31.8 g/dL (ref 31.5–36.0)
MCV: 96.4 fL (ref 79.5–101.0)
MONO#: 0.5 10*3/uL (ref 0.1–0.9)
MONO%: 7.5 % (ref 0.0–14.0)
NEUT#: 3.6 10*3/uL (ref 1.5–6.5)
NEUT%: 58.3 % (ref 38.4–76.8)
Platelets: 221 10*3/uL (ref 145–400)
RBC: 3.62 10*6/uL — ABNORMAL LOW (ref 3.70–5.45)
RDW: 14.8 % — ABNORMAL HIGH (ref 11.2–14.5)
WBC: 6.1 10*3/uL (ref 3.9–10.3)

## 2014-02-01 LAB — COMPREHENSIVE METABOLIC PANEL (CC13)
ALK PHOS: 82 U/L (ref 40–150)
ALT: 17 U/L (ref 0–55)
AST: 20 U/L (ref 5–34)
Albumin: 3.7 g/dL (ref 3.5–5.0)
Anion Gap: 9 mEq/L (ref 3–11)
BUN: 17.1 mg/dL (ref 7.0–26.0)
CO2: 26 mEq/L (ref 22–29)
Calcium: 9.3 mg/dL (ref 8.4–10.4)
Chloride: 107 mEq/L (ref 98–109)
Creatinine: 0.8 mg/dL (ref 0.6–1.1)
Glucose: 100 mg/dl (ref 70–140)
POTASSIUM: 3.9 meq/L (ref 3.5–5.1)
Sodium: 141 mEq/L (ref 136–145)
Total Bilirubin: 0.46 mg/dL (ref 0.20–1.20)
Total Protein: 6.6 g/dL (ref 6.4–8.3)

## 2014-02-01 LAB — HOLD TUBE, BLOOD BANK

## 2014-02-01 LAB — MAGNESIUM (CC13): Magnesium: 2.1 mg/dl (ref 1.5–2.5)

## 2014-02-01 MED ORDER — RADIAPLEXRX EX GEL
Freq: Once | CUTANEOUS | Status: AC
  Administered 2014-02-01: 10:00:00 via TOPICAL

## 2014-02-01 NOTE — Telephone Encounter (Signed)
, °

## 2014-02-01 NOTE — Progress Notes (Signed)
Weekly Management Note:  Site: Left breast Current Dose:  1800  cGy Projected Dose: 4680  cGy followed by 5 fraction boost  Narrative: The patient is seen today for routine under treatment assessment. CBCT/MVCT images/port films were reviewed. The chart was reviewed.   She does have a symptomatic lower extremity neuropathy secondary to her Taxotere. She takes Aleve when necessary. She uses Radioplex gel along her left breast. She does have slight left breast discomfort.  Physical Examination:  Filed Vitals:   02/01/14 0845  BP: 133/66  Pulse: 78  Temp: 99.1 F (37.3 C)  Resp: 20  .  Weight: 208 lb 11.2 oz (94.666 kg). There is faint erythema along her left breast. No areas of desquamation.  Impression: Tolerating radiation therapy well.  Plan: Continue radiation therapy as planned.

## 2014-02-01 NOTE — Telephone Encounter (Signed)
I have adjusted 9/23 appt

## 2014-02-01 NOTE — Addendum Note (Signed)
Encounter addended by: Andria Rhein, RN on: 02/01/2014  9:37 AM<BR>     Documentation filed: Inpatient MAR, Orders

## 2014-02-01 NOTE — Progress Notes (Signed)
Patient c/o "hard dull pain" in BLE from knees down to feet she states is due to Taxotere. She states she is taking Advil 200 mg, 2 tabs three times daily. She will discuss taking higher doses with Dr [name]. She states her shins are sensitive to touch, feet swelling but she states they are much less swollen today. She reports pain on the soles of her feet. She elevates her legs when she is able. Her left big toenail came off; she is soaking in Epsom salt/water then vinegar water per Dr Jana Hakim. She then applies Neosporin. Pt denies skin changes of left breast, but states her breast is "sore inside, tender". She is applying Radiaplex lotion to left breast.

## 2014-02-02 ENCOUNTER — Ambulatory Visit
Admission: RE | Admit: 2014-02-02 | Discharge: 2014-02-02 | Disposition: A | Discharge status: Home / Self Care | Payer: BC Managed Care – PPO | Source: Ambulatory Visit | Attending: Radiation Oncology | Admitting: Radiation Oncology

## 2014-02-02 DIAGNOSIS — Z510 Encounter for antineoplastic radiation therapy: Secondary | ICD-10-CM | POA: Diagnosis not present

## 2014-02-03 ENCOUNTER — Ambulatory Visit
Admission: RE | Admit: 2014-02-03 | Discharge: 2014-02-03 | Disposition: A | Discharge status: Home / Self Care | Payer: BC Managed Care – PPO | Source: Ambulatory Visit | Attending: Radiation Oncology | Admitting: Radiation Oncology

## 2014-02-03 ENCOUNTER — Encounter: Payer: Self-pay | Admitting: Radiation Oncology

## 2014-02-03 DIAGNOSIS — Z510 Encounter for antineoplastic radiation therapy: Secondary | ICD-10-CM | POA: Diagnosis not present

## 2014-02-03 NOTE — Progress Notes (Signed)
Complex simulation: The patient completed virtual complex simulation for her left breast boost. She was set up to 3 field technique. 3 separate multileaf collimators were designed to conform the field. She is being treated with 6 MV and 15 MV photons. I prescribing 1000 cGy in 5 sessions. An isodose plan was reviewed and accepted.

## 2014-02-04 ENCOUNTER — Ambulatory Visit
Admission: RE | Admit: 2014-02-04 | Discharge: 2014-02-04 | Disposition: A | Discharge status: Home / Self Care | Payer: BC Managed Care – PPO | Source: Ambulatory Visit | Attending: Radiation Oncology | Admitting: Radiation Oncology

## 2014-02-04 DIAGNOSIS — Z510 Encounter for antineoplastic radiation therapy: Secondary | ICD-10-CM | POA: Diagnosis not present

## 2014-02-07 ENCOUNTER — Ambulatory Visit
Admission: RE | Admit: 2014-02-07 | Discharge: 2014-02-07 | Disposition: A | Discharge status: Home / Self Care | Payer: BC Managed Care – PPO | Source: Ambulatory Visit | Attending: Radiation Oncology | Admitting: Radiation Oncology

## 2014-02-07 ENCOUNTER — Encounter: Payer: Self-pay | Admitting: Radiation Oncology

## 2014-02-07 ENCOUNTER — Other Ambulatory Visit: Payer: BC Managed Care – PPO

## 2014-02-07 ENCOUNTER — Ambulatory Visit: Payer: BC Managed Care – PPO | Admitting: Nurse Practitioner

## 2014-02-07 VITALS — BP 137/71 | HR 74 | Temp 98.2°F | Resp 20 | Wt 208.1 lb

## 2014-02-07 DIAGNOSIS — Z510 Encounter for antineoplastic radiation therapy: Secondary | ICD-10-CM | POA: Diagnosis not present

## 2014-02-07 DIAGNOSIS — C50512 Malignant neoplasm of lower-outer quadrant of left female breast: Secondary | ICD-10-CM

## 2014-02-07 NOTE — Progress Notes (Signed)
Patient continues to have pain in her LE. She denies other pain today, denies loss of appetite. She is fatigued, applying Radiaplex to left breast treatment area for some pinkness of skin.

## 2014-02-07 NOTE — Progress Notes (Signed)
Weekly Management Note:  Site: Left breast Current Dose:  2520  cGy Projected Dose: 4680  cGy followed by 5 fraction boost  Narrative: The patient is seen today for routine under treatment assessment. CBCT/MVCT images/port films were reviewed. The chart was reviewed.   She is without new complaints today. She does have mild to moderate left breast discomfort. She uses Radioplex gel.  Physical Examination:  Filed Vitals:   02/07/14 0843  BP: 137/71  Pulse: 74  Temp: 98.2 F (36.8 C)  Resp: 20  .  Weight: 208 lb 1.6 oz (94.394 kg). There is faint hyperpigmentation and faint erythema along the left breast. No areas of desquamation.  Impression: Tolerating radiation therapy well.  Plan: Continue radiation therapy as planned.

## 2014-02-08 ENCOUNTER — Ambulatory Visit
Admission: RE | Admit: 2014-02-08 | Discharge: 2014-02-08 | Disposition: A | Discharge status: Home / Self Care | Payer: BC Managed Care – PPO | Source: Ambulatory Visit | Attending: Radiation Oncology | Admitting: Radiation Oncology

## 2014-02-08 DIAGNOSIS — Z510 Encounter for antineoplastic radiation therapy: Secondary | ICD-10-CM | POA: Diagnosis not present

## 2014-02-09 ENCOUNTER — Ambulatory Visit
Admission: RE | Admit: 2014-02-09 | Discharge: 2014-02-09 | Disposition: A | Discharge status: Home / Self Care | Payer: BC Managed Care – PPO | Source: Ambulatory Visit | Attending: Radiation Oncology | Admitting: Radiation Oncology

## 2014-02-09 DIAGNOSIS — Z510 Encounter for antineoplastic radiation therapy: Secondary | ICD-10-CM | POA: Diagnosis not present

## 2014-02-10 ENCOUNTER — Ambulatory Visit
Admission: RE | Admit: 2014-02-10 | Discharge: 2014-02-10 | Disposition: A | Discharge status: Home / Self Care | Payer: BC Managed Care – PPO | Source: Ambulatory Visit | Attending: Radiation Oncology | Admitting: Radiation Oncology

## 2014-02-10 DIAGNOSIS — Z510 Encounter for antineoplastic radiation therapy: Secondary | ICD-10-CM | POA: Diagnosis not present

## 2014-02-11 ENCOUNTER — Ambulatory Visit
Admission: RE | Admit: 2014-02-11 | Discharge: 2014-02-11 | Disposition: A | Discharge status: Home / Self Care | Payer: BC Managed Care – PPO | Source: Ambulatory Visit | Attending: Radiation Oncology | Admitting: Radiation Oncology

## 2014-02-11 DIAGNOSIS — Z510 Encounter for antineoplastic radiation therapy: Secondary | ICD-10-CM | POA: Diagnosis not present

## 2014-02-14 ENCOUNTER — Encounter: Payer: Self-pay | Admitting: Radiation Oncology

## 2014-02-14 ENCOUNTER — Ambulatory Visit
Admission: RE | Admit: 2014-02-14 | Discharge: 2014-02-14 | Disposition: A | Discharge status: Home / Self Care | Payer: BC Managed Care – PPO | Source: Ambulatory Visit | Attending: Radiation Oncology | Admitting: Radiation Oncology

## 2014-02-14 VITALS — BP 134/74 | HR 71 | Temp 98.4°F | Resp 20 | Wt 210.6 lb

## 2014-02-14 DIAGNOSIS — Z510 Encounter for antineoplastic radiation therapy: Secondary | ICD-10-CM | POA: Diagnosis not present

## 2014-02-14 DIAGNOSIS — C50512 Malignant neoplasm of lower-outer quadrant of left female breast: Secondary | ICD-10-CM

## 2014-02-14 NOTE — Progress Notes (Signed)
Weekly Management Note:  Site: Left breast Current Dose:  3420  cGy Projected Dose: 4680  cGy followed by 5 fraction boost  Narrative: The patient is seen today for routine under treatment assessment. CBCT/MVCT images/port films were reviewed. The chart was reviewed.   She is having more left breast discomfort as expected. This is improved with taking up to 6 ibuprofen a day which she takes for her peripheral neuropathy involving her lower extremities. She uses Radioplex gel.  Physical Examination:  Filed Vitals:   02/14/14 0825  BP: 134/74  Pulse: 71  Temp: 98.4 F (36.9 C)  Resp: 20  .  Weight: 210 lb 9.6 oz (95.528 kg). There is mild to moderate erythema along left breast with no areas of desquamation.  Impression: Tolerating radiation therapy well.  Plan: Continue radiation therapy as planned.

## 2014-02-14 NOTE — Progress Notes (Signed)
Patient states she is having more sharp, shooting pains and tenderness/soreness in her left breast. She states her breast feels warm, skin is pink. She also continues to have LE weakness, discomfort especially when using stairs. She is applying Radiaplex to left breast treatment area, no desquamation. She states her energy level is improving, denies loss of appetite.

## 2014-02-15 ENCOUNTER — Ambulatory Visit
Admission: RE | Admit: 2014-02-15 | Discharge: 2014-02-15 | Disposition: A | Discharge status: Home / Self Care | Payer: BC Managed Care – PPO | Source: Ambulatory Visit | Attending: Radiation Oncology | Admitting: Radiation Oncology

## 2014-02-15 DIAGNOSIS — Z510 Encounter for antineoplastic radiation therapy: Secondary | ICD-10-CM | POA: Diagnosis not present

## 2014-02-16 ENCOUNTER — Telehealth: Payer: Self-pay | Admitting: Nurse Practitioner

## 2014-02-16 ENCOUNTER — Ambulatory Visit (HOSPITAL_BASED_OUTPATIENT_CLINIC_OR_DEPARTMENT_OTHER): Payer: BC Managed Care – PPO

## 2014-02-16 ENCOUNTER — Ambulatory Visit
Admission: RE | Admit: 2014-02-16 | Discharge: 2014-02-16 | Disposition: A | Discharge status: Home / Self Care | Payer: BC Managed Care – PPO | Source: Ambulatory Visit | Attending: Radiation Oncology | Admitting: Radiation Oncology

## 2014-02-16 ENCOUNTER — Encounter: Payer: Self-pay | Admitting: Nurse Practitioner

## 2014-02-16 ENCOUNTER — Telehealth: Payer: Self-pay | Admitting: *Deleted

## 2014-02-16 ENCOUNTER — Ambulatory Visit (HOSPITAL_BASED_OUTPATIENT_CLINIC_OR_DEPARTMENT_OTHER): Payer: BC Managed Care – PPO | Admitting: Nurse Practitioner

## 2014-02-16 ENCOUNTER — Other Ambulatory Visit (HOSPITAL_BASED_OUTPATIENT_CLINIC_OR_DEPARTMENT_OTHER): Payer: BC Managed Care – PPO

## 2014-02-16 VITALS — BP 104/73 | HR 78 | Temp 98.1°F | Resp 18 | Ht 68.0 in | Wt 208.4 lb

## 2014-02-16 DIAGNOSIS — D6481 Anemia due to antineoplastic chemotherapy: Secondary | ICD-10-CM

## 2014-02-16 DIAGNOSIS — C50919 Malignant neoplasm of unspecified site of unspecified female breast: Secondary | ICD-10-CM

## 2014-02-16 DIAGNOSIS — C50512 Malignant neoplasm of lower-outer quadrant of left female breast: Secondary | ICD-10-CM

## 2014-02-16 DIAGNOSIS — T451X5A Adverse effect of antineoplastic and immunosuppressive drugs, initial encounter: Secondary | ICD-10-CM

## 2014-02-16 DIAGNOSIS — R232 Flushing: Secondary | ICD-10-CM

## 2014-02-16 DIAGNOSIS — G620 Drug-induced polyneuropathy: Secondary | ICD-10-CM

## 2014-02-16 DIAGNOSIS — F341 Dysthymic disorder: Secondary | ICD-10-CM

## 2014-02-16 DIAGNOSIS — N951 Menopausal and female climacteric states: Secondary | ICD-10-CM

## 2014-02-16 DIAGNOSIS — Z510 Encounter for antineoplastic radiation therapy: Secondary | ICD-10-CM | POA: Diagnosis not present

## 2014-02-16 DIAGNOSIS — Z5112 Encounter for antineoplastic immunotherapy: Secondary | ICD-10-CM

## 2014-02-16 LAB — COMPREHENSIVE METABOLIC PANEL (CC13)
ALT: 15 U/L (ref 0–55)
ANION GAP: 8 meq/L (ref 3–11)
AST: 18 U/L (ref 5–34)
Albumin: 3.6 g/dL (ref 3.5–5.0)
Alkaline Phosphatase: 84 U/L (ref 40–150)
BUN: 12.7 mg/dL (ref 7.0–26.0)
CO2: 28 meq/L (ref 22–29)
Calcium: 9.6 mg/dL (ref 8.4–10.4)
Chloride: 105 mEq/L (ref 98–109)
Creatinine: 0.8 mg/dL (ref 0.6–1.1)
GLUCOSE: 100 mg/dL (ref 70–140)
Potassium: 3.6 mEq/L (ref 3.5–5.1)
Sodium: 141 mEq/L (ref 136–145)
TOTAL PROTEIN: 6.6 g/dL (ref 6.4–8.3)
Total Bilirubin: 0.5 mg/dL (ref 0.20–1.20)

## 2014-02-16 LAB — CBC WITH DIFFERENTIAL/PLATELET
BASO%: 0.8 % (ref 0.0–2.0)
BASOS ABS: 0 10*3/uL (ref 0.0–0.1)
EOS ABS: 0.6 10*3/uL — AB (ref 0.0–0.5)
EOS%: 11.9 % — ABNORMAL HIGH (ref 0.0–7.0)
HEMATOCRIT: 33.9 % — AB (ref 34.8–46.6)
HGB: 11 g/dL — ABNORMAL LOW (ref 11.6–15.9)
LYMPH%: 18.1 % (ref 14.0–49.7)
MCH: 30.7 pg (ref 25.1–34.0)
MCHC: 32.5 g/dL (ref 31.5–36.0)
MCV: 94.5 fL (ref 79.5–101.0)
MONO#: 0.5 10*3/uL (ref 0.1–0.9)
MONO%: 9.7 % (ref 0.0–14.0)
NEUT%: 59.5 % (ref 38.4–76.8)
NEUTROS ABS: 2.8 10*3/uL (ref 1.5–6.5)
PLATELETS: 189 10*3/uL (ref 145–400)
RBC: 3.59 10*6/uL — ABNORMAL LOW (ref 3.70–5.45)
RDW: 13.5 % (ref 11.2–14.5)
WBC: 4.8 10*3/uL (ref 3.9–10.3)
lymph#: 0.9 10*3/uL (ref 0.9–3.3)

## 2014-02-16 LAB — MAGNESIUM (CC13): MAGNESIUM: 2 mg/dL (ref 1.5–2.5)

## 2014-02-16 MED ORDER — ACETAMINOPHEN 325 MG PO TABS
650.0000 mg | ORAL_TABLET | Freq: Once | ORAL | Status: AC
  Administered 2014-02-16: 650 mg via ORAL

## 2014-02-16 MED ORDER — DIPHENHYDRAMINE HCL 25 MG PO CAPS
25.0000 mg | ORAL_CAPSULE | Freq: Once | ORAL | Status: AC
  Administered 2014-02-16: 25 mg via ORAL

## 2014-02-16 MED ORDER — RADIAPLEXRX EX GEL
Freq: Once | CUTANEOUS | Status: AC
  Administered 2014-02-16: 09:00:00 via TOPICAL

## 2014-02-16 MED ORDER — ACETAMINOPHEN 325 MG PO TABS
ORAL_TABLET | ORAL | Status: AC
  Filled 2014-02-16: qty 2

## 2014-02-16 MED ORDER — SODIUM CHLORIDE 0.9 % IV SOLN
Freq: Once | INTRAVENOUS | Status: AC
  Administered 2014-02-16: 11:00:00 via INTRAVENOUS

## 2014-02-16 MED ORDER — TRASTUZUMAB CHEMO INJECTION 440 MG
6.0000 mg/kg | Freq: Once | INTRAVENOUS | Status: AC
  Administered 2014-02-16: 567 mg via INTRAVENOUS
  Filled 2014-02-16: qty 27

## 2014-02-16 MED ORDER — HEPARIN SOD (PORK) LOCK FLUSH 100 UNIT/ML IV SOLN
500.0000 [IU] | Freq: Once | INTRAVENOUS | Status: AC | PRN
  Administered 2014-02-16: 500 [IU]
  Filled 2014-02-16: qty 5

## 2014-02-16 MED ORDER — GABAPENTIN 300 MG PO CAPS
300.0000 mg | ORAL_CAPSULE | Freq: Three times a day (TID) | ORAL | Status: DC
Start: 2014-02-16 — End: 2014-10-10

## 2014-02-16 MED ORDER — DIPHENHYDRAMINE HCL 25 MG PO CAPS
ORAL_CAPSULE | ORAL | Status: AC
  Filled 2014-02-16: qty 1

## 2014-02-16 MED ORDER — SODIUM CHLORIDE 0.9 % IJ SOLN
10.0000 mL | INTRAMUSCULAR | Status: DC | PRN
  Administered 2014-02-16: 10 mL
  Filled 2014-02-16: qty 10

## 2014-02-16 NOTE — Telephone Encounter (Signed)
Per staff message and POF I have scheduled appts. Advised scheduler of appts. JMW  

## 2014-02-16 NOTE — Telephone Encounter (Signed)
per pof to sch pt appt-sch and sent ME wmail to chge appt from 10/15 to 10/16 per HF-req to sch trmt on 11/30-pt in trmt room will get updated sch b4 leaving

## 2014-02-16 NOTE — Progress Notes (Signed)
[place]  [phone] [phone]     ID: [name] OB: January 20, 1956  MR#: [id number]  GBT#:[id number]  PCP: [name], NP GYN:  [name], MD SU: [name], MD OTHER MD: Arloa Koh, MD  CHIEF COMPLAINT: estrogen receptor positive breast cancer CURRENT TREATMENT: anti-HER-2 therapy  HISTORY OF PRESENT ILLNESS: From the original intake note:  [facility]ylvi [facility] showing a possible mass in the left breast. Diagnostic left mammography and ultrasonography at the breast center 04/26/2013 confirmed an oval mass with microcalcifications in the 6:00 position of the left breast. There was an adjacent partially obscured 1.2 cm mass. On physical exam there was no palpable mass in the left breast. By ultrasonography, there was an oval mass at the 6:00 position measuring 1.4 cm, with a second mass measuring 1.1 cm. There were no abnormal lymph nodes noted in the left axilla.  Biopsy of the 6:00 mass in the left breast January 2015 showed ([id number]) and invasive ductal carcinoma, grade 3, estrogen receptor 97% positive, progesterone receptor 75% positive, with strong staining intensity, and MIB-144%. HER-2 was amplified, with the signals ratio 4.18 and in number per cell of 9.40 .  On 06/08/2013 the patient underwent bilateral breast MRI which showed a dumbbell-shaped area in the central left breast consisting of a 2 cm mass and 2 extensions or an adjacent masses, measuring a total of 2.9 cm. There were no abnormal appearing lymph nodes for any other areas of concern.  The patient's subsequent history is as detailed below.   INTERVAL HISTORY: Telesia returns today for follow up of her breast cancer. She is continues trastuzumab every 3 weeks and is due today. Her last echo was last month and showed a well preserved ejection fraction. She is tolerating the treatments with no issues that she  can recall. She continues with daily radiation, and will complete this therapy on on 10/7.   REVIEW OF SYSTEMS: Cori has finally lost the toenails on her right and left great toe. Her neuropathy continues to her bilateral lower feet and are these are starting to hurt more. The swelling to her bilateral lower extremities has gone down, but the skin is now every irritated, dry and itchy. They are tender over the calves and shins and are sensitive when touched. They are beginning to turn pink, but this may be because of her scratching them frequently. She has hot flashes, mostly at night that awake her from her sleep. A detailed review of systems is otherwise stable.    PAST MEDICAL HISTORY: Past Medical History  Diagnosis Date  . Breast cancer 06/03/13    left, 6:00 o'clock  . Depression   . Hypothyroidism   . Hypokalemia   . Anemia due to chemotherapy     PAST SURGICAL HISTORY: Past Surgical History  Procedure Laterality Date  . Tonsillectomy  1972  . Cesarean section  04/1999  . Breast biopsy  1/14    lt  . Dilation and curettage of uterus    . Colonoscopy  2013  . Breast lumpectomy with needle localization and axillary sentinel lymph node bx Left 07/26/2013    Procedure: BREAST LUMPECTOMY WITH NEEDLE LOCALIZATION AND AXILLARY SENTINEL LYMPH NODE BX;  Surgeon: Shann Medal, MD;  Location: [facility];  Service: General;  Laterality: Left;  Marland Kitchen Mole removal Right 07/26/2013    Procedure: MOLE REMOVAL UNDER RIGHT BREAST;  Surgeon: Shann Medal, MD;  Location: [facility];  Service: General;  Laterality: Right;  . Portacath placement Right 07/26/2013    Procedure: INSERTION PORT-A-CATH;  Surgeon: Shann Medal, MD;  Location: [facility];  Service: General;  Laterality: Right;    FAMILY HISTORY Family History  Problem Relation Age of Onset  . Stroke Mother   . Diabetes Father   . Cancer Maternal Grandmother 54    colon   the patient's  father died at the age of 78 in a car accident. The patient's mother died at the age of 56 from a stroke. The patient has one adopted brother. She has one biological sister. The patient's mother's mother was diagnosed with colon cancer at the age of 15. There is no history of breast oropharynx cancer in the family to the patient's knowledge.  She asked about her daughter needing testing for brca and i said no.  GYNECOLOGIC HISTORY:   (Reviewed 11/15/2013) Menarche age 21. First live birth age 24. The patient is GX P1. She took birth control pills for approximately 12 years remotely. She had a period in February of 2014 and then in December of 2014. She had been started on hormone replacement in December. That has since been discontinued.  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 Her husband died at [place] 3 years ago with soft tissue sarcoma. The patient's significant other also works as a Geophysicist/field seismologist. At home it is just the patient and her daughter 27, 61 years old, who just finished middle school. The patient has 2 stepchildren from her earlier marriage. [name] is a Games developer in [place] and [place] (masc.) studies criminal Justice.  ADVANCED DIRECTIVES: Not in place   HEALTH MAINTENANCE:  (Updated 11/15/2013) History  Substance Use Topics  . Smoking status: Never Smoker   . Smokeless tobacco: Never Used  . Alcohol Use: Yes     Comment: 5-7 glasses per week of red wine     Colonoscopy: July 2014  PAP: November 2014  Bone density: November 2014  Lipid panel: Not on file  Allergies  Allergen Reactions  . Compazine [Prochlorperazine] Other (See Comments)    Extreme restlessness and mild extrapyramidal movement;  Patient tolerates Reglan with no problems    Current Outpatient Prescriptions  Medication Sig Dispense Refill  . citalopram (CELEXA)  20 MG tablet Take 1 tablet (20 mg total) by mouth daily.  30 tablet  5  . levothyroxine (SYNTHROID, LEVOTHROID) 112 MCG tablet Take 112 mcg by mouth daily before breakfast. Take sat and sunday      . lidocaine-prilocaine (EMLA) cream Apply 1 application topically as needed. 1-2 hr before each port access  30 g  5  . LORazepam (ATIVAN) 0.5 MG tablet Take 1 tablet (0.5 mg total) by mouth 2 (two) times daily.  60 tablet  0  . Wound Dressings (RADIAGEL) GEL Apply topically 2 (two) times daily.      Marland Kitchen gabapentin (NEURONTIN) 300 MG capsule Take 1 capsule (300 mg total) by mouth 3 (three) times daily.  30 capsule  2  . potassium chloride SA (K-DUR,KLOR-CON) 20 MEQ tablet Take 1 tablet (20 mEq total) by mouth 3 (three) times daily.  90 tablet  2   No current facility-administered medications for this visit.   Facility-Administered Medications Ordered in Other Visits  Medication Dose Route Frequency Provider Last Rate Last  Dose  . sodium chloride 0.9 % injection 10 mL  10 mL Intracatheter PRN Chauncey Cruel, MD   10 mL at 02/16/14 1143    OBJECTIVE: Middle-aged white woman who appears stated age 58 Vitals:   02/16/14 0925  BP: 104/73  Pulse: 78  Temp: 98.1 F (36.7 C)  Resp: 18   Body mass index is 31.69 kg/(m^2).    ECOG FS: 1 Filed Weights   02/16/14 0925  Weight: 208 lb 6.4 oz (94.53 kg)   Skin: warm, dry, bilateral lower extremities pink and dry with several small scabs from scratch marks, missing toenails to left and right great toe, nail beds hyperpigemented HEENT: sclerae anicteric, conjunctivae pink, oropharynx clear. No thrush or mucositis.  Lymph Nodes: No cervical or supraclavicular lymphadenopathy  Lungs: clear to auscultation bilaterally, no rales, wheezes, or rhonci  Heart: regular rate and rhythm  Abdomen: round, soft, non tender, positive bowel sounds  Musculoskeletal: No focal spinal tenderness, bilateral lower ankles with +1 edema Neuro: non focal, well oriented,  positive affect  Breasts: deferred  LAB RESULTS: CBC    Component Value Date/Time   WBC 4.8 02/16/2014 0906   RBC 3.59* 02/16/2014 0906   HGB 11.0* 02/16/2014 0906   HCT 33.9* 02/16/2014 0906   PLT 189 02/16/2014 0906   MCV 94.5 02/16/2014 0906   MCH 30.7 02/16/2014 0906   MCHC 32.5 02/16/2014 0906   RDW 13.5 02/16/2014 0906   LYMPHSABS 0.9 02/16/2014 0906   MONOABS 0.5 02/16/2014 0906   EOSABS 0.6* 02/16/2014 0906   BASOSABS 0.0 02/16/2014 0906    CMP     Component Value Date/Time   NA 141 02/16/2014 0906   K 3.6 02/16/2014 0906   CO2 28 02/16/2014 0906   GLUCOSE 100 02/16/2014 0906   BUN 12.7 02/16/2014 0906   CREATININE 0.8 02/16/2014 0906   CALCIUM 9.6 02/16/2014 0906   PROT 6.6 02/16/2014 0906   ALBUMIN 3.6 02/16/2014 0906   AST 18 02/16/2014 0906   ALT 15 02/16/2014 0906   ALKPHOS 84 02/16/2014 0906   BILITOT 0.50 02/16/2014 0906    STUDIES: Most recent echocardiogram on 12/28/13 showed an ejection fraction of 60-65%  ASSESSMENT: 58 y.o. [place] woman, status post left breast biopsy 06/03/2013 for a clinical T2 N0, stage IIA invasive ductal carcinoma, grade 3, estrogen and progesterone receptor positive, with an MIB-1 of 44%, and HER-2 amplified.  (1) status post left lumpectomy and sentinel lymph node sampling 07/26/2013 for a pT2 pN0, stage IIA invasive ductal carcinoma, grade 3, with negative margins.  (2) treated in the adjuvant setting with docetaxel/carboplatin (AUC 5) given along with trastuzumab/pertuzumab x6 cycles, first dose on 09/02/2013, last dose 12/16/2013  (3) trastuzumab to continue through March 2016; most recent echo 12/28/2013 showed an excellent ejection fraction  (4) adjuvant radiation to be completed 03/02/2014  (5) adjuvant anti-estrogens to follow radiation  (6)  Depression/anxiety - on Celexa, 20 mg  (7) chemotherapy-induced anemia    PLAN: Anapaula is doing well today. She is tolerating the trastuzumab well and will continue every 3 weeks through  March 2016 to complete a year of anti-her-2 therapy. She will continue to have repeat echocardiograms every 3 months, next due in November.   As for her hot flashes, she is interested in gabapentin 335m QHS to help her get through the night better. This should help with the foot pain that is a residual for her chemotherapy induced peripheral neuropathy as well.   AJuliannwill continue to  moisturize her bilateral lower extremities well with soothing lotions and aloe vera as tolerated. She will call if this issue does not resolve in the upcoming weeks. At this time it is not concerning for cellulitis.   Ariauna will return in 6 weeks for labs and and follow up visit with Dr. Jana Hakim. They will discuss beginning anti-estrogen therapy during this evaluation. She understands and agrees with this plan. She know the goal of treatment in her case is cure. She has been encouraged to call with any issues that might arise before her next visit here.   Marcelino Duster, NP   02/16/2014 1:41 PM

## 2014-02-17 ENCOUNTER — Ambulatory Visit
Admission: RE | Admit: 2014-02-17 | Discharge: 2014-02-17 | Disposition: A | Discharge status: Home / Self Care | Payer: BC Managed Care – PPO | Source: Ambulatory Visit | Attending: Radiation Oncology | Admitting: Radiation Oncology

## 2014-02-17 ENCOUNTER — Telehealth: Payer: Self-pay | Admitting: *Deleted

## 2014-02-17 ENCOUNTER — Telehealth: Payer: Self-pay | Admitting: Oncology

## 2014-02-17 DIAGNOSIS — Z510 Encounter for antineoplastic radiation therapy: Secondary | ICD-10-CM | POA: Diagnosis not present

## 2014-02-17 NOTE — Telephone Encounter (Signed)
, °

## 2014-02-17 NOTE — Telephone Encounter (Signed)
Per staff message and POF I have scheduled appts. Advised scheduler of appts. JMW  

## 2014-02-18 ENCOUNTER — Ambulatory Visit
Admission: RE | Admit: 2014-02-18 | Discharge: 2014-02-18 | Disposition: A | Discharge status: Home / Self Care | Payer: BC Managed Care – PPO | Source: Ambulatory Visit | Attending: Radiation Oncology | Admitting: Radiation Oncology

## 2014-02-18 ENCOUNTER — Telehealth: Payer: Self-pay | Admitting: Oncology

## 2014-02-18 ENCOUNTER — Other Ambulatory Visit: Payer: Self-pay | Admitting: *Deleted

## 2014-02-18 DIAGNOSIS — Z510 Encounter for antineoplastic radiation therapy: Secondary | ICD-10-CM | POA: Diagnosis not present

## 2014-02-18 NOTE — Telephone Encounter (Signed)
pt called today to confirm appts on schedule with next appt being 10/16. pt given each appt d/t 10/16 thru 11/30. per pt she is not having inj day after tx anymonr. pt forwarded to desk nurse re inj concern.

## 2014-02-21 ENCOUNTER — Ambulatory Visit: Payer: BC Managed Care – PPO | Admitting: Radiation Oncology

## 2014-02-21 ENCOUNTER — Ambulatory Visit
Admission: RE | Admit: 2014-02-21 | Discharge: 2014-02-21 | Disposition: A | Discharge status: Home / Self Care | Payer: BC Managed Care – PPO | Source: Ambulatory Visit | Attending: Radiation Oncology | Admitting: Radiation Oncology

## 2014-02-21 DIAGNOSIS — Z510 Encounter for antineoplastic radiation therapy: Secondary | ICD-10-CM | POA: Diagnosis not present

## 2014-02-21 DIAGNOSIS — C50512 Malignant neoplasm of lower-outer quadrant of left female breast: Secondary | ICD-10-CM

## 2014-02-21 MED ORDER — RADIAPLEXRX EX GEL
Freq: Once | CUTANEOUS | Status: AC
  Administered 2014-02-21: 17:00:00 via TOPICAL

## 2014-02-22 ENCOUNTER — Ambulatory Visit
Admission: RE | Admit: 2014-02-22 | Discharge: 2014-02-22 | Disposition: A | Discharge status: Home / Self Care | Payer: BC Managed Care – PPO | Source: Ambulatory Visit | Attending: Radiation Oncology | Admitting: Radiation Oncology

## 2014-02-22 VITALS — BP 131/57 | HR 73 | Temp 98.0°F | Resp 20 | Wt 208.0 lb

## 2014-02-22 DIAGNOSIS — C50512 Malignant neoplasm of lower-outer quadrant of left female breast: Secondary | ICD-10-CM

## 2014-02-22 DIAGNOSIS — Z510 Encounter for antineoplastic radiation therapy: Secondary | ICD-10-CM | POA: Diagnosis not present

## 2014-02-22 NOTE — Progress Notes (Signed)
Weekly Management Note:  Site: Left breast Current Dose:  4500  cGy Projected Dose: 4680  CGy followed by tumor bed boost of 1000 cGy in 5 sessions  Narrative: The patient is seen today for routine under treatment assessment. CBCT/MVCT images/port films were reviewed. The chart was reviewed.   She still doing well although she does have left breast discomfort. She uses Radioplex gel.  Physical Examination:  Filed Vitals:   02/22/14 0927  BP: 131/57  Pulse: 73  Temp: 98 F (36.7 C)  Resp: 20  .  Weight: 208 lb (94.348 kg). There is hyperpigmentation the skin along the left breast with no obvious desquamation.  Impression: Tolerating radiation therapy well. She finishes her radiation therapy next week.  Plan: Continue radiation therapy as planned.

## 2014-02-22 NOTE — Progress Notes (Signed)
Patient denies pain but continues to have tenderness of her left breast. She is applying Radiaplex to left breast for hyperpigmentation, darkening of skin. She denies itching, and she states the lotion "is very soothing". She denies fatigue, loss of appetite.

## 2014-02-23 ENCOUNTER — Ambulatory Visit
Admission: RE | Admit: 2014-02-23 | Discharge: 2014-02-23 | Disposition: A | Discharge status: Home / Self Care | Payer: BC Managed Care – PPO | Source: Ambulatory Visit | Attending: Radiation Oncology | Admitting: Radiation Oncology

## 2014-02-23 ENCOUNTER — Encounter: Payer: Self-pay | Admitting: *Deleted

## 2014-02-23 DIAGNOSIS — Z510 Encounter for antineoplastic radiation therapy: Secondary | ICD-10-CM | POA: Diagnosis not present

## 2014-02-24 ENCOUNTER — Encounter: Payer: Self-pay | Admitting: Radiation Oncology

## 2014-02-24 ENCOUNTER — Ambulatory Visit
Admission: RE | Admit: 2014-02-24 | Discharge: 2014-02-24 | Disposition: A | Discharge status: Home / Self Care | Payer: BC Managed Care – PPO | Source: Ambulatory Visit | Attending: Radiation Oncology | Admitting: Radiation Oncology

## 2014-02-24 DIAGNOSIS — Z510 Encounter for antineoplastic radiation therapy: Secondary | ICD-10-CM | POA: Insufficient documentation

## 2014-02-24 DIAGNOSIS — C50912 Malignant neoplasm of unspecified site of left female breast: Secondary | ICD-10-CM | POA: Insufficient documentation

## 2014-02-24 DIAGNOSIS — D0592 Unspecified type of carcinoma in situ of left breast: Secondary | ICD-10-CM | POA: Diagnosis not present

## 2014-02-24 NOTE — Progress Notes (Signed)
Simulation verification note: The patient underwent simulation verification today for her left breast boost. Her isocenter is in good position and the multileaf collimators contoured the treatment volume appropriately.

## 2014-02-25 ENCOUNTER — Ambulatory Visit
Admission: RE | Admit: 2014-02-25 | Discharge: 2014-02-25 | Disposition: A | Discharge status: Home / Self Care | Payer: BC Managed Care – PPO | Source: Ambulatory Visit | Attending: Radiation Oncology | Admitting: Radiation Oncology

## 2014-02-25 DIAGNOSIS — Z510 Encounter for antineoplastic radiation therapy: Secondary | ICD-10-CM | POA: Diagnosis not present

## 2014-02-28 ENCOUNTER — Encounter: Payer: Self-pay | Admitting: Radiation Oncology

## 2014-02-28 ENCOUNTER — Other Ambulatory Visit: Payer: Self-pay | Admitting: Oncology

## 2014-02-28 ENCOUNTER — Ambulatory Visit
Admission: RE | Admit: 2014-02-28 | Discharge: 2014-02-28 | Disposition: A | Discharge status: Home / Self Care | Payer: BC Managed Care – PPO | Source: Ambulatory Visit | Attending: Radiation Oncology | Admitting: Radiation Oncology

## 2014-02-28 VITALS — BP 128/86 | HR 80 | Temp 97.6°F | Resp 20 | Wt 209.6 lb

## 2014-02-28 DIAGNOSIS — Z510 Encounter for antineoplastic radiation therapy: Secondary | ICD-10-CM | POA: Diagnosis not present

## 2014-02-28 DIAGNOSIS — C50512 Malignant neoplasm of lower-outer quadrant of left female breast: Secondary | ICD-10-CM

## 2014-02-28 MED ORDER — RADIAPLEXRX EX GEL
Freq: Once | CUTANEOUS | Status: AC
  Administered 2014-02-28: 09:00:00 via TOPICAL

## 2014-02-28 MED ORDER — LORAZEPAM 0.5 MG PO TABS: 0.5000 mg | ORAL_TABLET | Freq: Two times a day (BID) | ORAL | Status: DC | PRN

## 2014-02-28 NOTE — Progress Notes (Signed)
Patient states she has occasional soreness of her left breast, states she was fatigued over the weekend. She is applying Radiaplex to left breast for hyperpigmentation, some dry desquamation. Advised she apply antibiotic ointment if she develops moist desquamation. She completes Wed, gave her 1 month fu card.

## 2014-02-28 NOTE — Addendum Note (Signed)
Encounter addended by: Andria Rhein, RN on: 02/28/2014  9:25 AM<BR>     Documentation filed: Inpatient MAR, Orders

## 2014-02-28 NOTE — Progress Notes (Signed)
Weekly Management Note:  Site: Left breast boost Current Dose:  600  cGy Projected Dose: 1000  cGy  Narrative: The patient is seen today for routine under treatment assessment. CBCT/MVCT images/port films were reviewed. The chart was reviewed.   She does have moderate fatigue. She continues with Radioplex gel. She finishes her treatment this Wednesday.  Physical Examination:  Filed Vitals:   02/28/14 0833  BP: 128/86  Pulse: 80  Temp: 97.6 F (36.4 C)  Resp: 20  .  Weight: 209 lb 9.6 oz (95.074 kg). There is hyperpigmentation the skin the left breast with patchy dry desquamation. No areas of moist desquamation.  Impression: Tolerating radiation therapy well. She'll finish her radiation therapy this Wednesday.  Plan: Continue radiation therapy as planned. One-month followup after completion of radiation therapy.

## 2014-03-01 ENCOUNTER — Ambulatory Visit
Admission: RE | Admit: 2014-03-01 | Discharge: 2014-03-01 | Disposition: A | Discharge status: Home / Self Care | Payer: BC Managed Care – PPO | Source: Ambulatory Visit | Attending: Radiation Oncology | Admitting: Radiation Oncology

## 2014-03-01 DIAGNOSIS — Z510 Encounter for antineoplastic radiation therapy: Secondary | ICD-10-CM | POA: Diagnosis not present

## 2014-03-02 ENCOUNTER — Encounter: Payer: Self-pay | Admitting: Radiation Oncology

## 2014-03-02 ENCOUNTER — Ambulatory Visit
Admission: RE | Admit: 2014-03-02 | Discharge: 2014-03-02 | Disposition: A | Discharge status: Home / Self Care | Payer: BC Managed Care – PPO | Source: Ambulatory Visit | Attending: Radiation Oncology | Admitting: Radiation Oncology

## 2014-03-02 DIAGNOSIS — Z510 Encounter for antineoplastic radiation therapy: Secondary | ICD-10-CM | POA: Diagnosis not present

## 2014-03-02 NOTE — Progress Notes (Signed)
[facility] End of Treatment Note  Name:Radiance LENAH MESSENGER  Date: 03/02/2014 QPR:[id number] DOB:[birth year]   Status:outpatient    CC: [name], NP  Dr. [name], Dr. Gunnar Bulla Magrinat  REFERRING PHYSICIAN:  Dr. [name]   DIAGNOSIS: Stage IIA (T2 N0 M0) invasive ductal/DCIS of the left breast   INDICATION FOR TREATMENT: Curative   TREATMENT DATES: 01/18/2014 through 03/02/2014                          SITE/DOSE:  Left breast 4680 cGy in 26 sessions followed by boost of 1000 cGy in 5 sessions                          BEAMS/ENERGY:   Tangential fields to the left breast with deep respiration breath-hold with mixed 6 MV and 15 MV photons. Left breast boost with three-field technique again with 6 MV and 15 MV photons .                NARRATIVE: Ms. Gagliano tolerated her treatment well with the expected degree of hyperpigmentation/erythema and patchy dry desquamation the skin by completion of therapy.                           PLAN: Routine followup in one month. Patient instructed to call if questions or worsening complaints in interim. She will see Dr. Jana Hakim on November 5 for discussion of antiestrogen therapy.

## 2014-03-04 ENCOUNTER — Telehealth: Payer: Self-pay | Admitting: *Deleted

## 2014-03-04 NOTE — Telephone Encounter (Signed)
error 

## 2014-03-10 ENCOUNTER — Ambulatory Visit: Payer: BC Managed Care – PPO

## 2014-03-10 ENCOUNTER — Other Ambulatory Visit: Payer: BC Managed Care – PPO

## 2014-03-11 ENCOUNTER — Ambulatory Visit: Payer: BC Managed Care – PPO

## 2014-03-11 ENCOUNTER — Other Ambulatory Visit (HOSPITAL_BASED_OUTPATIENT_CLINIC_OR_DEPARTMENT_OTHER): Payer: BC Managed Care – PPO

## 2014-03-11 ENCOUNTER — Ambulatory Visit (HOSPITAL_BASED_OUTPATIENT_CLINIC_OR_DEPARTMENT_OTHER): Payer: BC Managed Care – PPO

## 2014-03-11 VITALS — BP 135/64 | HR 71 | Temp 98.0°F | Resp 18

## 2014-03-11 DIAGNOSIS — C50512 Malignant neoplasm of lower-outer quadrant of left female breast: Secondary | ICD-10-CM

## 2014-03-11 DIAGNOSIS — Z23 Encounter for immunization: Secondary | ICD-10-CM

## 2014-03-11 DIAGNOSIS — C50812 Malignant neoplasm of overlapping sites of left female breast: Secondary | ICD-10-CM

## 2014-03-11 DIAGNOSIS — C50919 Malignant neoplasm of unspecified site of unspecified female breast: Secondary | ICD-10-CM

## 2014-03-11 DIAGNOSIS — Z5112 Encounter for antineoplastic immunotherapy: Secondary | ICD-10-CM

## 2014-03-11 LAB — BASIC METABOLIC PANEL (CC13)
Anion Gap: 9 mEq/L (ref 3–11)
BUN: 15 mg/dL (ref 7.0–26.0)
CHLORIDE: 106 meq/L (ref 98–109)
CO2: 28 mEq/L (ref 22–29)
Calcium: 9.6 mg/dL (ref 8.4–10.4)
Creatinine: 0.8 mg/dL (ref 0.6–1.1)
Glucose: 100 mg/dl (ref 70–140)
POTASSIUM: 3.8 meq/L (ref 3.5–5.1)
Sodium: 143 mEq/L (ref 136–145)

## 2014-03-11 LAB — CBC WITH DIFFERENTIAL/PLATELET
BASO%: 0.3 % (ref 0.0–2.0)
BASOS ABS: 0 10*3/uL (ref 0.0–0.1)
EOS%: 7 % (ref 0.0–7.0)
Eosinophils Absolute: 0.2 10*3/uL (ref 0.0–0.5)
HCT: 34.2 % — ABNORMAL LOW (ref 34.8–46.6)
HEMOGLOBIN: 11.2 g/dL — AB (ref 11.6–15.9)
LYMPH#: 0.8 10*3/uL — AB (ref 0.9–3.3)
LYMPH%: 25.6 % (ref 14.0–49.7)
MCH: 30.7 pg (ref 25.1–34.0)
MCHC: 32.7 g/dL (ref 31.5–36.0)
MCV: 93.7 fL (ref 79.5–101.0)
MONO#: 0.4 10*3/uL (ref 0.1–0.9)
MONO%: 11.4 % (ref 0.0–14.0)
NEUT%: 55.7 % (ref 38.4–76.8)
NEUTROS ABS: 1.8 10*3/uL (ref 1.5–6.5)
Platelets: 144 10*3/uL — ABNORMAL LOW (ref 145–400)
RBC: 3.65 10*6/uL — ABNORMAL LOW (ref 3.70–5.45)
RDW: 13.3 % (ref 11.2–14.5)
WBC: 3.2 10*3/uL — AB (ref 3.9–10.3)

## 2014-03-11 MED ORDER — SODIUM CHLORIDE 0.9 % IJ SOLN
10.0000 mL | INTRAMUSCULAR | Status: DC | PRN
  Administered 2014-03-11: 10 mL
  Filled 2014-03-11: qty 10

## 2014-03-11 MED ORDER — DIPHENHYDRAMINE HCL 25 MG PO CAPS
ORAL_CAPSULE | ORAL | Status: AC
  Filled 2014-03-11: qty 1

## 2014-03-11 MED ORDER — ACETAMINOPHEN 325 MG PO TABS
ORAL_TABLET | ORAL | Status: AC
  Filled 2014-03-11: qty 1

## 2014-03-11 MED ORDER — SODIUM CHLORIDE 0.9 % IV SOLN
Freq: Once | INTRAVENOUS | Status: AC
  Administered 2014-03-11: 10:00:00 via INTRAVENOUS

## 2014-03-11 MED ORDER — DIPHENHYDRAMINE HCL 25 MG PO CAPS
25.0000 mg | ORAL_CAPSULE | Freq: Once | ORAL | Status: AC
  Administered 2014-03-11: 25 mg via ORAL

## 2014-03-11 MED ORDER — SODIUM CHLORIDE 0.9 % IV SOLN
6.0000 mg/kg | Freq: Once | INTRAVENOUS | Status: AC
  Administered 2014-03-11: 567 mg via INTRAVENOUS
  Filled 2014-03-11: qty 27

## 2014-03-11 MED ORDER — HEPARIN SOD (PORK) LOCK FLUSH 100 UNIT/ML IV SOLN
500.0000 [IU] | Freq: Once | INTRAVENOUS | Status: AC | PRN
  Administered 2014-03-11: 500 [IU]
  Filled 2014-03-11: qty 5

## 2014-03-11 MED ORDER — ACETAMINOPHEN 325 MG PO TABS
650.0000 mg | ORAL_TABLET | Freq: Once | ORAL | Status: AC
  Administered 2014-03-11: 650 mg via ORAL

## 2014-03-11 MED ORDER — INFLUENZA VAC SPLIT QUAD 0.5 ML IM SUSY
0.5000 mL | PREFILLED_SYRINGE | Freq: Once | INTRAMUSCULAR | Status: AC
  Administered 2014-03-11: 0.5 mL via INTRAMUSCULAR
  Filled 2014-03-11: qty 0.5

## 2014-03-11 NOTE — Patient Instructions (Signed)
[facility] Discharge Instructions for Patients Receiving Chemotherapy  Today you received the following chemotherapy agents Herceptin  To help prevent nausea and vomiting after your treatment, we encourage you to take your nausea medication  Zofran as directed   If you develop nausea and vomiting that is not controlled by your nausea medication, call the clinic.   BELOW ARE SYMPTOMS THAT SHOULD BE REPORTED IMMEDIATELY:  *FEVER GREATER THAN 100.5 F  *CHILLS WITH OR WITHOUT FEVER  NAUSEA AND VOMITING THAT IS NOT CONTROLLED WITH YOUR NAUSEA MEDICATION  *UNUSUAL SHORTNESS OF BREATH  *UNUSUAL BRUISING OR BLEEDING  TENDERNESS IN MOUTH AND THROAT WITH OR WITHOUT PRESENCE OF ULCERS  *URINARY PROBLEMS  *BOWEL PROBLEMS  UNUSUAL RASH Items with * indicate a potential emergency and should be followed up as soon as possible.  Feel free to call the clinic you have any questions or concerns. The clinic phone number is [phone]. Influenza Virus Vaccine injection What is this medicine? INFLUENZA VIRUS VACCINE (in floo EN zuh VAHY ruhs vak SEEN) helps to reduce the risk of getting influenza also known as the flu. The vaccine only helps protect you against some strains of the flu. This medicine may be used for other purposes; ask your health care provider or pharmacist if you have questions. COMMON BRAND NAME(S): Afluria, Agriflu, Fluarix, Fluarix Quadrivalent, FLUCELVAX, Flulaval, Fluvirin, Fluzone, Fluzone High-Dose, Fluzone Intradermal What should I tell my health care provider before I take this medicine? They need to know if you have any of these conditions: -bleeding disorder like hemophilia -fever or infection -Guillain-Barre syndrome or other neurological problems -immune system problems -infection with the human immunodeficiency virus (HIV) or AIDS -low blood platelet counts -multiple sclerosis -an unusual or allergic reaction to influenza virus vaccine, latex,  other medicines, foods, dyes, or preservatives. Different brands of vaccines contain different allergens. Some may contain latex or eggs. Talk to your doctor about your allergies to make sure that you get the right vaccine. -pregnant or trying to get pregnant -breast-feeding How should I use this medicine? This vaccine is for injection into a muscle or under the skin. It is given by a health care professional. A copy of Vaccine Information Statements will be given before each vaccination. Read this sheet carefully each time. The sheet may change frequently. Talk to your healthcare provider to see which vaccines are right for you. Some vaccines should not be used in all age groups. Overdosage: If you think you have taken too much of this medicine contact a poison control center or emergency room at once. NOTE: This medicine is only for you. Do not share this medicine with others. What if I miss a dose? This does not apply. What may interact with this medicine? -chemotherapy or radiation therapy -medicines that lower your immune system like etanercept, anakinra, infliximab, and adalimumab -medicines that treat or prevent blood clots like warfarin -phenytoin -steroid medicines like prednisone or cortisone -theophylline -vaccines This list may not describe all possible interactions. Give your health care provider a list of all the medicines, herbs, non-prescription drugs, or dietary supplements you use. Also tell them if you smoke, drink alcohol, or use illegal drugs. Some items may interact with your medicine. What should I watch for while using this medicine? Report any side effects that do not go away within 3 days to your doctor or health care professional. Call your health care provider if any unusual symptoms occur within 6 weeks of receiving this vaccine. You may still  catch the flu, but the illness is not usually as bad. You cannot get the flu from the vaccine. The vaccine will not protect  against colds or other illnesses that may cause fever. The vaccine is needed every year. What side effects may I notice from receiving this medicine? Side effects that you should report to your doctor or health care professional as soon as possible: -allergic reactions like skin rash, itching or hives, swelling of the face, lips, or tongue Side effects that usually do not require medical attention (report to your doctor or health care professional if they continue or are bothersome): -fever -headache -muscle aches and pains -pain, tenderness, redness, or swelling at the injection site -tiredness This list may not describe all possible side effects. Call your doctor for medical advice about side effects. You may report side effects to FDA at 1-800-FDA-1088. Where should I keep my medicine? The vaccine will be given by a health care professional in a clinic, pharmacy, doctor's office, or other health care setting. You will not be given vaccine doses to store at home. NOTE: This sheet is a summary. It may not cover all possible information. If you have questions about this medicine, talk to your doctor, pharmacist, or health care provider.  2015, Elsevier/Gold Standard. (2011-11-21 79:48:01)

## 2014-03-12 ENCOUNTER — Ambulatory Visit: Payer: BC Managed Care – PPO

## 2014-03-25 ENCOUNTER — Other Ambulatory Visit: Payer: BC Managed Care – PPO

## 2014-03-28 ENCOUNTER — Other Ambulatory Visit: Payer: Self-pay | Admitting: Oncology

## 2014-03-28 ENCOUNTER — Other Ambulatory Visit: Payer: Self-pay

## 2014-03-28 ENCOUNTER — Encounter: Payer: Self-pay | Admitting: Radiation Oncology

## 2014-03-28 DIAGNOSIS — Z853 Personal history of malignant neoplasm of breast: Secondary | ICD-10-CM

## 2014-03-29 ENCOUNTER — Encounter: Payer: Self-pay | Admitting: *Deleted

## 2014-03-30 ENCOUNTER — Other Ambulatory Visit: Payer: Self-pay | Admitting: *Deleted

## 2014-03-30 DIAGNOSIS — C50512 Malignant neoplasm of lower-outer quadrant of left female breast: Secondary | ICD-10-CM

## 2014-03-31 ENCOUNTER — Ambulatory Visit
Admission: RE | Admit: 2014-03-31 | Discharge: 2014-03-31 | Disposition: A | Discharge status: Home / Self Care | Payer: BC Managed Care – PPO | Source: Ambulatory Visit | Attending: Radiation Oncology | Admitting: Radiation Oncology

## 2014-03-31 ENCOUNTER — Ambulatory Visit (HOSPITAL_BASED_OUTPATIENT_CLINIC_OR_DEPARTMENT_OTHER): Payer: BC Managed Care – PPO | Admitting: Oncology

## 2014-03-31 ENCOUNTER — Ambulatory Visit (HOSPITAL_BASED_OUTPATIENT_CLINIC_OR_DEPARTMENT_OTHER): Payer: BC Managed Care – PPO

## 2014-03-31 ENCOUNTER — Encounter: Payer: Self-pay | Admitting: Radiation Oncology

## 2014-03-31 ENCOUNTER — Other Ambulatory Visit (HOSPITAL_BASED_OUTPATIENT_CLINIC_OR_DEPARTMENT_OTHER): Payer: BC Managed Care – PPO

## 2014-03-31 ENCOUNTER — Telehealth: Payer: Self-pay | Admitting: *Deleted

## 2014-03-31 ENCOUNTER — Ambulatory Visit (HOSPITAL_COMMUNITY)
Admission: RE | Admit: 2014-03-31 | Discharge: 2014-03-31 | Disposition: A | Discharge status: Home / Self Care | Payer: BC Managed Care – PPO | Source: Ambulatory Visit | Attending: Oncology | Admitting: Oncology

## 2014-03-31 ENCOUNTER — Telehealth: Payer: Self-pay | Admitting: Oncology

## 2014-03-31 VITALS — BP 124/73 | HR 84 | Temp 98.0°F | Resp 20 | Wt 209.0 lb

## 2014-03-31 VITALS — BP 135/61 | HR 75 | Temp 98.2°F | Resp 18 | Ht 68.0 in | Wt 210.9 lb

## 2014-03-31 DIAGNOSIS — C50512 Malignant neoplasm of lower-outer quadrant of left female breast: Secondary | ICD-10-CM

## 2014-03-31 DIAGNOSIS — F418 Other specified anxiety disorders: Secondary | ICD-10-CM

## 2014-03-31 DIAGNOSIS — C50812 Malignant neoplasm of overlapping sites of left female breast: Secondary | ICD-10-CM

## 2014-03-31 DIAGNOSIS — G629 Polyneuropathy, unspecified: Secondary | ICD-10-CM

## 2014-03-31 DIAGNOSIS — D6481 Anemia due to antineoplastic chemotherapy: Secondary | ICD-10-CM

## 2014-03-31 DIAGNOSIS — R0781 Pleurodynia: Secondary | ICD-10-CM | POA: Diagnosis not present

## 2014-03-31 DIAGNOSIS — Z170 Estrogen receptor positive status [ER+]: Secondary | ICD-10-CM

## 2014-03-31 DIAGNOSIS — Z5112 Encounter for antineoplastic immunotherapy: Secondary | ICD-10-CM

## 2014-03-31 LAB — COMPREHENSIVE METABOLIC PANEL (CC13)
ALT: 23 U/L (ref 0–55)
ANION GAP: 9 meq/L (ref 3–11)
AST: 19 U/L (ref 5–34)
Albumin: 3.9 g/dL (ref 3.5–5.0)
Alkaline Phosphatase: 98 U/L (ref 40–150)
BUN: 16.1 mg/dL (ref 7.0–26.0)
CALCIUM: 9.8 mg/dL (ref 8.4–10.4)
CHLORIDE: 103 meq/L (ref 98–109)
CO2: 27 meq/L (ref 22–29)
Creatinine: 0.8 mg/dL (ref 0.6–1.1)
Glucose: 104 mg/dl (ref 70–140)
Potassium: 4.1 mEq/L (ref 3.5–5.1)
SODIUM: 139 meq/L (ref 136–145)
TOTAL PROTEIN: 6.7 g/dL (ref 6.4–8.3)
Total Bilirubin: 0.4 mg/dL (ref 0.20–1.20)

## 2014-03-31 LAB — CBC WITH DIFFERENTIAL/PLATELET
BASO%: 0.7 % (ref 0.0–2.0)
Basophils Absolute: 0 10*3/uL (ref 0.0–0.1)
EOS%: 5.1 % (ref 0.0–7.0)
Eosinophils Absolute: 0.2 10*3/uL (ref 0.0–0.5)
HCT: 35.3 % (ref 34.8–46.6)
HGB: 11.4 g/dL — ABNORMAL LOW (ref 11.6–15.9)
LYMPH#: 1 10*3/uL (ref 0.9–3.3)
LYMPH%: 24.3 % (ref 14.0–49.7)
MCH: 29.8 pg (ref 25.1–34.0)
MCHC: 32.4 g/dL (ref 31.5–36.0)
MCV: 92.1 fL (ref 79.5–101.0)
MONO#: 0.5 10*3/uL (ref 0.1–0.9)
MONO%: 11.6 % (ref 0.0–14.0)
NEUT#: 2.3 10*3/uL (ref 1.5–6.5)
NEUT%: 58.3 % (ref 38.4–76.8)
Platelets: 210 10*3/uL (ref 145–400)
RBC: 3.84 10*6/uL (ref 3.70–5.45)
RDW: 13.4 % (ref 11.2–14.5)
WBC: 3.9 10*3/uL (ref 3.9–10.3)

## 2014-03-31 MED ORDER — GABAPENTIN 300 MG PO CAPS: 300.0000 mg | ORAL_CAPSULE | Freq: Three times a day (TID) | ORAL | Status: DC

## 2014-03-31 MED ORDER — ACETAMINOPHEN 325 MG PO TABS
650.0000 mg | ORAL_TABLET | Freq: Once | ORAL | Status: AC
  Administered 2014-03-31: 650 mg via ORAL

## 2014-03-31 MED ORDER — GABAPENTIN 100 MG PO CAPS: 100.0000 mg | ORAL_CAPSULE | Freq: Three times a day (TID) | ORAL | Status: DC

## 2014-03-31 MED ORDER — DIPHENHYDRAMINE HCL 25 MG PO CAPS
25.0000 mg | ORAL_CAPSULE | Freq: Once | ORAL | Status: AC
  Administered 2014-03-31: 25 mg via ORAL

## 2014-03-31 MED ORDER — SULFAMETHOXAZOLE-TRIMETHOPRIM 800-160 MG PO TABS: 1.0000 | ORAL_TABLET | Freq: Two times a day (BID) | ORAL | Status: DC

## 2014-03-31 MED ORDER — TAMOXIFEN CITRATE 20 MG PO TABS: 20.0000 mg | ORAL_TABLET | Freq: Every day | ORAL | Status: DC

## 2014-03-31 MED ORDER — HEPARIN SOD (PORK) LOCK FLUSH 100 UNIT/ML IV SOLN
500.0000 [IU] | Freq: Once | INTRAVENOUS | Status: AC | PRN
  Administered 2014-03-31: 500 [IU]
  Filled 2014-03-31: qty 5

## 2014-03-31 MED ORDER — TRASTUZUMAB CHEMO INJECTION 440 MG
6.0000 mg/kg | Freq: Once | INTRAVENOUS | Status: AC
  Administered 2014-03-31: 567 mg via INTRAVENOUS
  Filled 2014-03-31: qty 27

## 2014-03-31 MED ORDER — ACETAMINOPHEN 325 MG PO TABS
ORAL_TABLET | ORAL | Status: AC
  Filled 2014-03-31: qty 2

## 2014-03-31 MED ORDER — SODIUM CHLORIDE 0.9 % IV SOLN
Freq: Once | INTRAVENOUS | Status: AC
  Administered 2014-03-31: 11:00:00 via INTRAVENOUS

## 2014-03-31 MED ORDER — DIPHENHYDRAMINE HCL 25 MG PO CAPS
ORAL_CAPSULE | ORAL | Status: AC
  Filled 2014-03-31: qty 1

## 2014-03-31 MED ORDER — SODIUM CHLORIDE 0.9 % IJ SOLN
10.0000 mL | INTRAMUSCULAR | Status: DC | PRN
  Administered 2014-03-31: 10 mL
  Filled 2014-03-31: qty 10

## 2014-03-31 NOTE — Progress Notes (Signed)
Follow-up note:  Lori Vincent returns today approximately 1 month following completion of radiation therapy following conservative surgery and adjuvant chemotherapy in the management of her T2 N0 invasive ductal/DCIS of the left breast. She continues to have left breast discomfort which she describes as having sharp pains also a dull ache. Her pain is improved with ibuprofen. She sees Dr. Jana Hakim today for discussion of antiestrogen therapy. She continues with Herceptin through next spring.  Physical examination: Alert and oriented. Filed Vitals:   03/31/14 0814  BP: 124/73  Pulse: 84  Temp: 98 F (36.7 C)  Resp: 20   Head and neck examination: She wears a wig. Nodes: Without palpable cervical, supraclavicular, or axillary lymphadenopathy. Chest: Right anterior Port-A-Cath. Breasts: There is residual hyperpigmentation of the skin along the left breast. There is mild thickening of the left breast. There is tenderness on palpation along the superior and central aspect of the left breast. No masses are appreciated. Right breast without masses or lesions. Extremities: Without edema.  Impression: Satisfactory progress although she still has resolving left breast discomfort which should improve over the next few months. As mentioned above, she will discuss antiestrogen therapy with Dr. Jana Hakim.she is scheduled for mammography in January 2016.  Plan: Follow-up through Dr. Jana Hakim.

## 2014-03-31 NOTE — Progress Notes (Signed)
Patient states she continues to have sharp shooting pains daily in left breast, "tenderness inside". She continues to apply Radiaplex to left breast, no desquamation. She denies fatigue, loss of appetite. She will see Dr Jana Hakim today, receive Herceptin.

## 2014-03-31 NOTE — Telephone Encounter (Signed)
Per staff message and POF I have scheduled appts. Advised scheduler of appts. JMW  

## 2014-03-31 NOTE — Patient Instructions (Signed)

## 2014-03-31 NOTE — Progress Notes (Signed)
[place]  [phone] [phone]) [phone]     ID: Lori Vincent OB: [birth year]  MR#: [id number]  TGG#:[id number]  PCP: Lori Fusi, NP GYN:  Lori Posey, MD SU: Lori Overall, MD OTHER MD: Lori Koh, MD  CHIEF COMPLAINT: estrogen receptor positive breast cancer CURRENT TREATMENT: anti-HER-2 therapy  HISTORY OF PRESENT ILLNESS: From the original intake note:  Lori Vincent [facility] showing a possible mass in the left breast. Diagnostic left mammography and ultrasonography at the breast center 04/26/2013 confirmed an oval mass with microcalcifications in the 6:00 position of the left breast. There was an adjacent partially obscured 1.2 cm mass. On physical exam there was no palpable mass in the left breast. By ultrasonography, there was an oval mass at the 6:00 position measuring 1.4 cm, with a second mass measuring 1.1 cm. There were no abnormal lymph nodes noted in the left axilla.  Biopsy of the 6:00 mass in the left breast January 2015 showed ([id number]) and invasive ductal carcinoma, grade 3, estrogen receptor 97% positive, progesterone receptor 75% positive, with strong staining intensity, and MIB-144%. HER-2 was amplified, with the signals ratio 4.18 and in number per cell of 9.40 .  On 06/08/2013 the patient underwent bilateral breast MRI which showed a dumbbell-shaped area in the central left breast consisting of a 2 cm mass and 2 extensions or an adjacent masses, measuring a total of 2.9 cm. There were no abnormal appearing lymph nodes for any other areas of concern.  The patient's subsequent history is as detailed below.   INTERVAL HISTORY: Lori Vincent returns today for follow up of her breast cancer. She is continues trastuzumab every 3 weeks.she tolerates that well. Also, since the last visit here, she completed her radiation treatments.  REVIEW OF SYSTEMS: Lori Vincent Is still a little  fatigued from the radiation but is doing better. What is bothering her the most is tingling and numbness in her feet. This is worse around the front of the feet but she also sometimes the pain all the way up the ankles bilaterally. There is no swelling or erythema. Sometimes she has shooting pains in the left breast. She understands that these are benign. She is having some urinary tract infection symptoms. She is having more "gas than before". She has not changed her diet but she has been on antibiotics intermittently and that might explain that. She has a pain in her left flank, close to the spine, which is not constant or increasing in intensity, but which concerns her for the possibility of metastatic disease. She continues to have significant problems with insomnia. Aside from this up detailed review of systems was noncontributory   PAST MEDICAL HISTORY: Past Medical History  Diagnosis Date  . Breast cancer 06/03/13    left, 6:00 o'clock  . Depression   . Hypothyroidism   . Hypokalemia   . Anemia due to chemotherapy   . History of radiation therapy 01/18/14- 03/02/14    left breast 4680 cGy 26 sessions, boost of 1000 cGy 5 sessions    PAST SURGICAL HISTORY: Past Surgical History  Procedure Laterality Date  . Tonsillectomy  1972  . Cesarean section  04/1999  . Breast biopsy  1/14    lt  . Dilation and curettage of uterus    . Colonoscopy  2013  . Breast lumpectomy with needle localization and axillary sentinel lymph node bx Left 07/26/2013    Procedure: BREAST LUMPECTOMY WITH NEEDLE LOCALIZATION AND AXILLARY  SENTINEL LYMPH NODE BX;  Surgeon: Shann Medal, MD;  Location: [facility];  Service: General;  Laterality: Left;  Lori Vincent Kitchen Mole removal Right 07/26/2013    Procedure: MOLE REMOVAL UNDER RIGHT BREAST;  Surgeon: Shann Medal, MD;  Location: [facility];  Service: General;  Laterality: Right;  . Portacath placement Right 07/26/2013    Procedure: INSERTION  PORT-A-CATH;  Surgeon: Shann Medal, MD;  Location: [facility];  Service: General;  Laterality: Right;    FAMILY HISTORY Family History  Problem Relation Age of Onset  . Stroke Mother   . Diabetes Father   . Cancer Maternal Grandmother 73    colon   the patient's father died at the age of 71 in a car accident. The patient's mother died at the age of 46 from a stroke. The patient has one adopted brother. She has one biological sister. The patient's mother's mother was diagnosed with colon cancer at the age of 35. There is no history of breast oropharynx cancer in the family to the patient's knowledge.  She asked about her daughter needing testing for brca and i said no.  GYNECOLOGIC HISTORY:   (Reviewed 11/15/2013) Menarche age 66. First live birth age 72. The patient is GX P1. She took birth control pills for approximately 12 years remotely. She had a period in February of 2014 and then in December of 2014. She had been started on hormone replacement in December. That has since been discontinued.  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 Her husband died at [place] 3 years ago with soft tissue sarcoma. The patient's significant other also works as a Geophysicist/field seismologist. At home it is just the patient and her daughter 70, 54 years old, who just finished middle school. The patient has 2 stepchildren from her earlier marriage. 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  ADVANCED DIRECTIVES: Not in place   HEALTH MAINTENANCE:  (Updated 11/15/2013) History  Substance Use Topics  . Smoking status: Never Smoker   . Smokeless tobacco: Never Used  . Alcohol Use: Yes     Comment: 5-7 glasses per week of red wine     Colonoscopy: July 2014  PAP: November 2014  Bone density: November 2014  Lipid panel: Not on  file  Allergies  Allergen Reactions  . Compazine [Prochlorperazine] Other (See Comments)    Extreme restlessness and mild extrapyramidal movement;  Patient tolerates Reglan with no problems    Current Outpatient Prescriptions  Medication Sig Dispense Refill  . citalopram (CELEXA) 20 MG tablet Take 1 tablet (20 mg total) by mouth daily. 30 tablet 5  . gabapentin (NEURONTIN) 100 MG capsule Take 1 capsule (100 mg total) by mouth 3 (three) times daily. 90 capsule 4  . gabapentin (NEURONTIN) 300 MG capsule Take 1 capsule (300 mg total) by mouth 3 (three) times daily. 30 capsule 2  . gabapentin (NEURONTIN) 300 MG capsule Take 1 capsule (300 mg total) by mouth 3 (three) times daily. 30 capsule 4  . levothyroxine (SYNTHROID, LEVOTHROID) 112 MCG tablet Take 112 mcg by mouth daily before breakfast. Take sat and sunday    . lidocaine-prilocaine (EMLA) cream Apply 1 application topically as needed. 1-2 hr before each port access 30 g 5  . LORazepam (ATIVAN) 0.5 MG tablet Take 1 tablet (0.5 mg  total) by mouth 2 (two) times daily as needed for anxiety. 60 tablet 0  . sulfamethoxazole-trimethoprim (BACTRIM DS,SEPTRA DS) 800-160 MG per tablet Take 1 tablet by mouth 2 (two) times daily. 10 tablet 0  . tamoxifen (NOLVADEX) 20 MG tablet Take 1 tablet (20 mg total) by mouth daily. 90 tablet 4  . Wound Dressings (RADIAGEL) GEL Apply topically 2 (two) times daily.     No current facility-administered medications for this visit.    OBJECTIVE: Middle-aged white woman in no acute distress  Filed Vitals:   03/31/14 0919  BP: 135/61  Pulse: 75  Temp: 98.2 F (36.8 C)  Resp: 18   Body mass index is 32.07 kg/(m^2).    ECOG FS: 1 Filed Weights   03/31/14 0919  Weight: 210 lb 14.4 oz (95.664 kg)   Sclerae unicteric, pupils equal and reactive Oropharynx clear and moist-- no thrush No cervical or supraclavicular adenopathy Lungs no rales or rhonchi Heart regular rate and rhythm Abd soft, nontender, positive  bowel sounds MSK no focal spinal tenderness, no upper extremity lymphedema; minimal tenderness in the mid left back approximately 5 cm from the spine with no mass or erythema noted in that area  Neuro: nonfocal, well oriented, appropriate affect Breasts: the right breast is unremarkable. The left breast is status post lumpectomy and radiation. There is still some hyperpigmentation, but no desquamation. There is tenderness to palpation particularly under the areola. The left axilla is benign    LAB RESULTS: CBC    Component Value Date/Time   WBC 3.9 03/31/2014 0906   RBC 3.84 03/31/2014 0906   HGB 11.4* 03/31/2014 0906   HCT 35.3 03/31/2014 0906   PLT 210 03/31/2014 0906   MCV 92.1 03/31/2014 0906   MCH 29.8 03/31/2014 0906   MCHC 32.4 03/31/2014 0906   RDW 13.4 03/31/2014 0906   LYMPHSABS 1.0 03/31/2014 0906   MONOABS 0.5 03/31/2014 0906   EOSABS 0.2 03/31/2014 0906   BASOSABS 0.0 03/31/2014 0906    CMP     Component Value Date/Time   NA 139 03/31/2014 0906   K 4.1 03/31/2014 0906   CO2 27 03/31/2014 0906   GLUCOSE 104 03/31/2014 0906   BUN 16.1 03/31/2014 0906   CREATININE 0.8 03/31/2014 0906   CALCIUM 9.8 03/31/2014 0906   PROT 6.7 03/31/2014 0906   ALBUMIN 3.9 03/31/2014 0906   AST 19 03/31/2014 0906   ALT 23 03/31/2014 0906   ALKPHOS 98 03/31/2014 0906   BILITOT 0.40 03/31/2014 0906    STUDIES: Most recent echocardiogram on 12/28/13 showed an ejection fraction of 60-65%  ASSESSMENT: 58 y.o. [place] woman, status post left breast biopsy 06/03/2013 for a clinical T2 N0, stage IIA invasive ductal carcinoma, grade 3, estrogen and progesterone receptor positive, with an MIB-1 of 44%, and HER-2 amplified.  (1) status post left lumpectomy and sentinel lymph node sampling 07/26/2013 for a pT2 pN0, stage IIA invasive ductal carcinoma, grade 3, with negative margins.  (2) treated in the adjuvant setting with docetaxel/carboplatin (AUC 5) given along with  trastuzumab/pertuzumab x6 cycles, first dose on 09/02/2013, last dose 12/16/2013  (3) trastuzumab to continue through March 2016; most recent echo 12/28/2013 showed an excellent ejection fraction  (4) adjuvant radiation completed 03/02/2014  (5) adjuvant anti-estrogens to follow radiation  (6)  Depression/anxiety - on Celexa, 20 mg  (7) chemotherapy-induced anemia    PLAN: Sreya has multiple concerns as noted above. She would like to meet with a nutritionist and I have requested that. She  is having significant peripheral neuropathy. I am starting her on gabapentin 100 mg 3 times a day during the day and 300 mg at bedtime. I'm also referring her to physical therapy in case that helps. She has a urinary tract infection and I'm starting her on Bactrim twice daily for the next 3 days area did finally we are going to evaluate the pain she has on her left side with a chest x-ray and plain rib films on the left.  We then discussed tamoxifen. She has a good understanding of normal menopausal symptoms ("I have all of those"), and how tamoxifen effects or does not affect those. She is can see me again in January to make sure she is tolerating it well. Note that she had [facility] and that was normal. She is using barrier contraceptives. She understands that tamoxifen is not a contraceptive and that she is perimenopausal but perhaps not completely menopausal.  Vincent Ebony Hail is doing very well. She is already scheduled for a repeat echo later this month.She knows to call for any problems that may develop before her next visit here.  Chauncey Cruel, MD   03/31/2014 10:23 AM

## 2014-03-31 NOTE — Telephone Encounter (Signed)
, °

## 2014-03-31 NOTE — Addendum Note (Signed)
Addended by: Laureen Abrahams on: 03/31/2014 03:59 PM   Modules accepted: Orders, Medications

## 2014-04-01 ENCOUNTER — Ambulatory Visit: Payer: BC Managed Care – PPO

## 2014-04-18 ENCOUNTER — Ambulatory Visit: Payer: BC Managed Care – PPO | Attending: Oncology | Admitting: Physical Therapy

## 2014-04-18 DIAGNOSIS — C50512 Malignant neoplasm of lower-outer quadrant of left female breast: Secondary | ICD-10-CM | POA: Insufficient documentation

## 2014-04-18 DIAGNOSIS — M79671 Pain in right foot: Secondary | ICD-10-CM

## 2014-04-18 DIAGNOSIS — M79672 Pain in left foot: Secondary | ICD-10-CM | POA: Diagnosis not present

## 2014-04-18 DIAGNOSIS — Z5189 Encounter for other specified aftercare: Secondary | ICD-10-CM | POA: Insufficient documentation

## 2014-04-18 DIAGNOSIS — R29898 Other symptoms and signs involving the musculoskeletal system: Secondary | ICD-10-CM

## 2014-04-18 NOTE — Therapy (Signed)
Physical Therapy Evaluation  Patient Details  Name: Lori Vincent MRN: [id number] Date of Birth: January 23, 1956  Encounter Date: 04/18/2014      PT End of Session - 04/18/14 1656    Visit Number 1   Number of Visits 9   Date for PT Re-Evaluation 05/18/14   PT Start Time 1191   PT Stop Time 1650   PT Time Calculation (min) 40 min      Past Medical History  Diagnosis Date  . Breast cancer 06/03/13    left, 6:00 o'clock  . Depression   . Hypothyroidism   . Hypokalemia   . Anemia due to chemotherapy   . History of radiation therapy 01/18/14- 03/02/14    left breast 4680 cGy 26 sessions, boost of 1000 cGy 5 sessions    Past Surgical History  Procedure Laterality Date  . Tonsillectomy  1972  . Cesarean section  04/1999  . Breast biopsy  1/14    lt  . Dilation and curettage of uterus    . Colonoscopy  2013  . Breast lumpectomy with needle localization and axillary sentinel lymph node bx Left 07/26/2013    Procedure: BREAST LUMPECTOMY WITH NEEDLE LOCALIZATION AND AXILLARY SENTINEL LYMPH NODE BX;  Surgeon: Shann Medal, MD;  Location: [facility];  Service: General;  Laterality: Left;  Mar[place] Kitchen Mole removal Right 07/26/2013    Procedure: MOLE REMOVAL UNDER RIGHT BREAST;  Surgeon: Shann Medal, MD;  Location: [facility];  Service: General;  Laterality: Right;  . Portacath placement Right 07/26/2013    Procedure: INSERTION PORT-A-CATH;  Surgeon: Shann Medal, MD;  Location: [facility];  Service: General;  Laterality: Right;    There were no vitals taken for this visit.  Visit Diagnosis:  Pain in both feet - Plan: PT plan of care cert/re-cert  Weakness of both legs - Plan: PT plan of care cert/re-cert      Subjective Assessment - 04/18/14 1612    Symptoms Toes on both feet are numb and tingly; have severe pain in feet when I walk.  Also reports moderate to sever muscle weakness and difficulty standing from sitting.   Pertinent History Left  breast cancer diagnosed in January 2015 with lumpectomy and 2 sentinel nodes removed; adjuvant chemotherapy finished 12/16/13; radiation finished three weeks ago.   Limitations Other (comment)  Standing from sitting without use of arms very difficult   Currently in Pain? Yes   Pain Score 3   up to 5 with standing, and worse on other days   Pain Location Foot   Pain Orientation Right;Left   Pain Descriptors / Indicators Burning;Aching   Aggravating Factors  worse as the day goes on; with being on your feet, and with walking   Pain Relieving Factors gabapentin helps (takes the edge off); Advil helped, but pt. stopped because of stomach aches          [facility] PT Assessment - 04/18/14 0001    Assessment   Medical Diagnosis breast cancer on left   Precautions   Precautions Other (comment)   Precaution Comments cancer precautions; fall risk   Restrictions   Weight Bearing Restrictions No   Balance Screen   Has the patient fallen in the past 6 months No   Has the patient had a decrease in activity level because of a fear of falling?  No   Is the patient reluctant to leave their home because of a fear of falling?  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 with slight tightness left; LEs slightly limited and pt. reports stiffness                  Plan - 04/18/14 1656    Clinical Impression Statement Pt. with significant LE weakness and painful foot  neuropathy should benefit from therapy for strengthening and pain relief.   Pt will benefit from skilled therapeutic intervention in order to improve on the following deficits Pain;Decreased strength;Decreased mobility   Rehab Potential Good   PT Frequency 2x / week   PT Duration 4 weeks   PT Treatment/Interventions Electrical Stimulation;Manual techniques;Therapeutic exercise;Patient/family education   PT Next Visit Plan Begin trial of massage and electrical stimulation for foot pain; begin HEP for LE strengthening   Consulted and Agree with Plan of Care Patient        Problem List Patient Active Problem List   Diagnosis Date Noted  . Antineoplastic chemotherapy induced anemia(285.3) 01/07/2014  . Low magnesium levels 01/06/2014  . Hemorrhoid 11/15/2013  . Hypokalemia 11/04/2013  . Pedal edema 10/14/2013  . Diarrhea 09/23/2013  . GERD (gastroesophageal reflux disease) 08/23/2013  . Anxiety 08/23/2013  . Breast cancer of lower-outer quadrant of left female breast 06/19/2013            LYMPHEDEMA/ONCOLOGY QUESTIONNAIRE - 04/18/14 1618    Type   Cancer Type left breast   Surgeries   Lumpectomy Date 07/26/13   Treatment   Past Chemotherapy Treatment Yes   Date 12/16/13   Past Radiation Treatment Yes   Date 03/02/14   Current Hormone Treatment Yes   Date 04/26/14  will start tamoxifen; also still on herceptin   Lymphedema Assessments   Lymphedema Assessments Upper extremities   Right Upper Extremity Lymphedema   10 cm Proximal to Olecranon Process 31.1 cm   Olecranon Process 24.2 cm   10 cm Proximal to Ulnar Styloid Process 20.9 cm   Just Proximal to Ulnar Styloid Process 15.2 cm   Across Hand at [employer] 18.3 cm   At [place] of 2nd Digit 5.5 cm   Left Upper Extremity Lymphedema   10 cm Proximal to Olecranon Process 31 cm   Olecranon Process 24.3 cm   10 cm Proximal to Ulnar Styloid Process 21.3 cm   Just Proximal to Ulnar Styloid Process 15.1 cm   Across  Hand at [employer] 18.7 cm   At [place] of 2nd Digit 5.7 cm                                        Long Term Clinic Goals - 04/18/14 1659    CC Long Term Goal  #1   Title Pt. will report at least 40% decrease in foot pain.   Time 4   Period Weeks  CC Long Term Goal  #2   Title Pt. will be independent in self-pain management for foot pain.   CC Long Term Goal  #3   Title Pt. will be independent in home exercise program for LE strengthing.   CC Long Term Goal  #4   Title Pt. will report at least 25% greater ease in coming to standing from sitting position.          [place], PT 04/18/2014, 5:05 PM

## 2014-04-19 ENCOUNTER — Ambulatory Visit: Payer: BC Managed Care – PPO | Admitting: Physical Therapy

## 2014-04-19 DIAGNOSIS — M79672 Pain in left foot: Principal | ICD-10-CM

## 2014-04-19 DIAGNOSIS — Z5189 Encounter for other specified aftercare: Secondary | ICD-10-CM | POA: Diagnosis not present

## 2014-04-19 DIAGNOSIS — M79671 Pain in right foot: Secondary | ICD-10-CM

## 2014-04-19 DIAGNOSIS — R29898 Other symptoms and signs involving the musculoskeletal system: Secondary | ICD-10-CM

## 2014-04-19 NOTE — Therapy (Signed)
Physical Therapy Treatment  Patient Details  Name: Lori Vincent MRN: [id number] Date of Birth: [birth year]  Encounter Date: 04/19/2014      PT End of Session - 04/19/14 1527    Visit Number 2   Number of Visits 9   Date for PT Re-Evaluation 05/18/14   PT Start Time 6063   PT Stop Time 1520   PT Time Calculation (min) 45 min      Past Medical History  Diagnosis Date  . Breast cancer 06/03/13    left, 6:00 o'clock  . Depression   . Hypothyroidism   . Hypokalemia   . Anemia due to chemotherapy   . History of radiation therapy 01/18/14- 03/02/14    left breast 4680 cGy 26 sessions, boost of 1000 cGy 5 sessions    Past Surgical History  Procedure Laterality Date  . Tonsillectomy  1972  . Cesarean section  04/1999  . Breast biopsy  1/14    lt  . Dilation and curettage of uterus    . Colonoscopy  2013  . Breast lumpectomy with needle localization and axillary sentinel lymph node bx Left 07/26/2013    Procedure: BREAST LUMPECTOMY WITH NEEDLE LOCALIZATION AND AXILLARY SENTINEL LYMPH NODE BX;  Surgeon: Shann Medal, MD;  Location: [facility];  Service: General;  Laterality: Left;  Marland Kitchen Mole removal Right 07/26/2013    Procedure: MOLE REMOVAL UNDER RIGHT BREAST;  Surgeon: Shann Medal, MD;  Location: [facility];  Service: General;  Laterality: Right;  . Portacath placement Right 07/26/2013    Procedure: INSERTION PORT-A-CATH;  Surgeon: Shann Medal, MD;  Location: [facility];  Service: General;  Laterality: Right;    There were no vitals taken for this visit.  Visit Diagnosis:  Pain in both feet  Weakness of both legs      Subjective Assessment - 04/19/14 1437    Symptoms "I feel good, except my feet."   Currently in Pain? Yes   Pain Score 7    Pain Location Foot   Pain Orientation Right;Left   Aggravating Factors  worse as the day goes on; was 0 this morning            [facility] Adult PT Treatment/Exercise - 04/19/14 0001     Exercises   Exercises Knee/Hip   Knee/Hip Exercises: Supine   Bridges AROM;Both;10 reps   Bridges Limitations --  limited ROM   Straight Leg Raises Right;Left;5 reps   Knee/Hip Exercises: Sidelying   Hip ABduction AROM;Right;Left;Other (comment)  8 reps   Hip ADduction AROM;Right;Left;5 reps   Hip ADduction Limitations limited ROM   Manual Therapy   Manual Therapy Massage   Massage Soft tissue work to right and left foot in supine with feet elevated; used Biotone and light to moderate pressure for relaxation/pain-relieving massage.           PT Education - 04/19/14 1526    Education provided Yes   Education Details bridging; supine and sidelying SLR   Person(s) Educated Patient   Methods Explanation;Handout;Verbal cues   Comprehension Returned demonstration;Verbalized understanding              Plan - 04/19/14 1528    Clinical Impression Statement Pt. with fair tolerance of exercise as noted and prescribed for HEP.  Pt. reported significant (50%?) immediate pain remediation with soft tissue work to feet.   PT Next Visit Plan Ask patient about the length of pain relief she got from massage  to feet; review and progress LE strengthening HEP; try electrical stimulation to back or feet for pain relief.   PT Home Exercise Plan See instructions given.        Problem List Patient Active Problem List   Diagnosis Date Noted  . Antineoplastic chemotherapy induced anemia(285.3) 01/07/2014  . Low magnesium levels 01/06/2014  . Hemorrhoid 11/15/2013  . Hypokalemia 11/04/2013  . Pedal edema 10/14/2013  . Diarrhea 09/23/2013  . GERD (gastroesophageal reflux disease) 08/23/2013  . Anxiety 08/23/2013  . Breast cancer of lower-outer quadrant of left female breast 06/19/2013            LYMPHEDEMA/ONCOLOGY QUESTIONNAIRE - 04/18/14 1618    Type   Cancer Type left breast   Surgeries   Lumpectomy Date 07/26/13   Treatment   Past Chemotherapy Treatment Yes   Date  12/16/13   Past Radiation Treatment Yes   Date 03/02/14   Current Hormone Treatment Yes   Date 04/26/14  will start tamoxifen; also still on herceptin   Lymphedema Assessments   Lymphedema Assessments Upper extremities   Right Upper Extremity Lymphedema   10 cm Proximal to Olecranon Process 31.1 cm   Olecranon Process 24.2 cm   10 cm Proximal to Ulnar Styloid Process 20.9 cm   Just Proximal to Ulnar Styloid Process 15.2 cm   Across Hand at [employer] 18.3 cm   At [place] of 2nd Digit 5.5 cm   Left Upper Extremity Lymphedema   10 cm Proximal to Olecranon Process 31 cm   Olecranon Process 24.3 cm   10 cm Proximal to Ulnar Styloid Process 21.3 cm   Just Proximal to Ulnar Styloid Process 15.1 cm   Across Hand at [employer] 18.7 cm   At [place] of 2nd Digit 5.7 cm                                        Long Term Clinic Goals - 04/18/14 1659    CC Long Term Goal  #1   Title Pt. will report at least 40% decrease in foot pain.   Time 4   Period Weeks   Status New   CC Long Term Goal  #2   Title Pt. will be independent in self-pain management for foot pain.   Status New   CC Long Term Goal  #3   Title Pt. will be independent in home exercise program for LE strengthing.   Status New   CC Long Term Goal  #4   Title Pt. will report at least 25% greater ease in coming to standing from sitting position.   Status New          [place],DONNA, PT  04/19/2014, 3:33 PM

## 2014-04-19 NOTE — Patient Instructions (Addendum)
Bridging   Slowly raise buttocks from floor, keeping stomach tight. Repeat _10___ times per set. Do ___1_ sets per session. Do __1__ sessions per day.  http://orth.exer.us/1097   Copyright  VHI. All rights reserved.  Straight Leg Raise   Tighten stomach and slowly raise locked right leg __12__ inches from floor. Repeat __8__ times per set. Do _1___ sets per session. Do __1__ sessions per day.  Repeat with left leg.  http://orth.exer.us/1103   Copyright  VHI. All rights reserved.  ABDUCTION: Side-Lying (Active)   Lie on left side, top leg straight. Raise top leg as far as possible. Use __0_ lbs. Complete _1__ sets of _10__ repetitions. Perform __1_ sessions per day.  http://gtsc.exer.us/95   Copyright  VHI. All rights reserved.

## 2014-04-25 ENCOUNTER — Other Ambulatory Visit (HOSPITAL_BASED_OUTPATIENT_CLINIC_OR_DEPARTMENT_OTHER): Payer: BC Managed Care – PPO

## 2014-04-25 ENCOUNTER — Ambulatory Visit (HOSPITAL_BASED_OUTPATIENT_CLINIC_OR_DEPARTMENT_OTHER): Payer: BC Managed Care – PPO

## 2014-04-25 ENCOUNTER — Ambulatory Visit: Payer: BC Managed Care – PPO | Admitting: Nutrition

## 2014-04-25 ENCOUNTER — Other Ambulatory Visit: Payer: Self-pay | Admitting: *Deleted

## 2014-04-25 DIAGNOSIS — C50812 Malignant neoplasm of overlapping sites of left female breast: Secondary | ICD-10-CM

## 2014-04-25 DIAGNOSIS — C50512 Malignant neoplasm of lower-outer quadrant of left female breast: Secondary | ICD-10-CM

## 2014-04-25 DIAGNOSIS — Z5112 Encounter for antineoplastic immunotherapy: Secondary | ICD-10-CM

## 2014-04-25 LAB — COMPREHENSIVE METABOLIC PANEL (CC13)
ALK PHOS: 92 U/L (ref 40–150)
ALT: 18 U/L (ref 0–55)
ANION GAP: 11 meq/L (ref 3–11)
AST: 19 U/L (ref 5–34)
Albumin: 3.8 g/dL (ref 3.5–5.0)
BUN: 16.2 mg/dL (ref 7.0–26.0)
CO2: 28 mEq/L (ref 22–29)
CREATININE: 0.8 mg/dL (ref 0.6–1.1)
Calcium: 9.9 mg/dL (ref 8.4–10.4)
Chloride: 103 mEq/L (ref 98–109)
Glucose: 113 mg/dl (ref 70–140)
Potassium: 3.7 mEq/L (ref 3.5–5.1)
Sodium: 142 mEq/L (ref 136–145)
Total Bilirubin: 0.38 mg/dL (ref 0.20–1.20)
Total Protein: 6.7 g/dL (ref 6.4–8.3)

## 2014-04-25 LAB — CBC WITH DIFFERENTIAL/PLATELET
BASO%: 0.7 % (ref 0.0–2.0)
Basophils Absolute: 0 10*3/uL (ref 0.0–0.1)
EOS%: 5.6 % (ref 0.0–7.0)
Eosinophils Absolute: 0.3 10*3/uL (ref 0.0–0.5)
HCT: 36 % (ref 34.8–46.6)
HGB: 11.6 g/dL (ref 11.6–15.9)
LYMPH%: 23.7 % (ref 14.0–49.7)
MCH: 29.2 pg (ref 25.1–34.0)
MCHC: 32.2 g/dL (ref 31.5–36.0)
MCV: 90.8 fL (ref 79.5–101.0)
MONO#: 0.5 10*3/uL (ref 0.1–0.9)
MONO%: 9.9 % (ref 0.0–14.0)
NEUT#: 2.8 10*3/uL (ref 1.5–6.5)
NEUT%: 60.1 % (ref 38.4–76.8)
PLATELETS: 229 10*3/uL (ref 145–400)
RBC: 3.96 10*6/uL (ref 3.70–5.45)
RDW: 13.7 % (ref 11.2–14.5)
WBC: 4.6 10*3/uL (ref 3.9–10.3)
lymph#: 1.1 10*3/uL (ref 0.9–3.3)

## 2014-04-25 MED ORDER — DIPHENHYDRAMINE HCL 25 MG PO CAPS
ORAL_CAPSULE | ORAL | Status: AC
  Filled 2014-04-25: qty 1

## 2014-04-25 MED ORDER — SODIUM CHLORIDE 0.9 % IJ SOLN
10.0000 mL | INTRAMUSCULAR | Status: DC | PRN
  Filled 2014-04-25: qty 10

## 2014-04-25 MED ORDER — ACETAMINOPHEN 325 MG PO TABS
650.0000 mg | ORAL_TABLET | Freq: Once | ORAL | Status: AC
  Administered 2014-04-25: 650 mg via ORAL

## 2014-04-25 MED ORDER — HEPARIN SOD (PORK) LOCK FLUSH 100 UNIT/ML IV SOLN
500.0000 [IU] | Freq: Once | INTRAVENOUS | Status: DC | PRN
  Filled 2014-04-25: qty 5

## 2014-04-25 MED ORDER — SODIUM CHLORIDE 0.9 % IV SOLN
Freq: Once | INTRAVENOUS | Status: AC
  Administered 2014-04-25: 10:00:00 via INTRAVENOUS

## 2014-04-25 MED ORDER — TRASTUZUMAB CHEMO INJECTION 440 MG
6.0000 mg/kg | Freq: Once | INTRAVENOUS | Status: AC
  Administered 2014-04-25: 567 mg via INTRAVENOUS
  Filled 2014-04-25: qty 27

## 2014-04-25 MED ORDER — ACETAMINOPHEN 325 MG PO TABS
ORAL_TABLET | ORAL | Status: AC
  Filled 2014-04-25: qty 2

## 2014-04-25 MED ORDER — DIPHENHYDRAMINE HCL 25 MG PO CAPS
25.0000 mg | ORAL_CAPSULE | Freq: Once | ORAL | Status: AC
Start: 2014-04-25 — End: 2014-04-25
  Administered 2014-04-25: 25 mg via ORAL

## 2014-04-25 NOTE — Patient Instructions (Signed)

## 2014-04-25 NOTE — Progress Notes (Signed)
58 year old female diagnosed with breast cancer.  She is a patient of Dr. Jana Hakim.  Past medical history includes depression, hypothyroidism, hypokalemia, and anemia.  Medications include Celexa, Synthroid and Ativan.  Labs were reviewed.  Height: 68 inches. Weight: 209 pounds November 5. BMI: 31.79.  Patient requesting information on healthy diet during and after chemotherapy.  Patient has no nutrition side effects at this time.  Nutrition diagnosis: Food and nutrition related knowledge deficit related to diagnosis of breast cancer and associated treatments as evidenced by no prior need for nutrition related information.  Intervention:  Patient educated on consuming healthy, plant-based diet with a minimum of 5-7 servings, vegetables, and fruits daily.   Recommended lean protein sources.   Reviewed importance of a low-fat diet.   Briefly discussed evidence-based research on consuming, soy foods.   Encouraged exercise as permitted by physician. Stressed the importance of portion control.   Questions were answered and teach back method used. Patient was provided with my contact information.  Monitoring, evaluation, goals: Patient will tolerate healthy, plant-based diet to promote slow, safe weight loss.  No followup scheduled.  **Disclaimer: This note was dictated with voice recognition software. Similar sounding words can inadvertently be transcribed and this note may contain transcription errors which may not have been corrected upon publication of note.**

## 2014-04-26 ENCOUNTER — Ambulatory Visit: Payer: BC Managed Care – PPO | Attending: Oncology

## 2014-04-26 DIAGNOSIS — M79671 Pain in right foot: Secondary | ICD-10-CM | POA: Diagnosis not present

## 2014-04-26 DIAGNOSIS — M79672 Pain in left foot: Secondary | ICD-10-CM | POA: Insufficient documentation

## 2014-04-26 DIAGNOSIS — R29898 Other symptoms and signs involving the musculoskeletal system: Secondary | ICD-10-CM | POA: Diagnosis not present

## 2014-04-26 DIAGNOSIS — Z5189 Encounter for other specified aftercare: Secondary | ICD-10-CM | POA: Insufficient documentation

## 2014-04-26 DIAGNOSIS — C50512 Malignant neoplasm of lower-outer quadrant of left female breast: Secondary | ICD-10-CM | POA: Insufficient documentation

## 2014-04-26 NOTE — Therapy (Signed)
Physical Therapy Treatment  Patient Details  Name: Lori Vincent MRN: [id number] Date of Birth: 25-Dec-1955  Encounter Date: 04/26/2014      PT End of Session - 04/26/14 0904    Visit Number 3   Number of Visits 9   Date for PT Re-Evaluation 05/18/14   PT Start Time 0851   PT Stop Time 0952   PT Time Calculation (min) 61 min      Past Medical History  Diagnosis Date  . Breast cancer 06/03/13    left, 6:00 o'clock  . Depression   . Hypothyroidism   . Hypokalemia   . Anemia due to chemotherapy   . History of radiation therapy 01/18/14- 03/02/14    left breast 4680 cGy 26 sessions, boost of 1000 cGy 5 sessions    Past Surgical History  Procedure Laterality Date  . Tonsillectomy  1972  . Cesarean section  04/1999  . Breast biopsy  1/14    lt  . Dilation and curettage of uterus    . Colonoscopy  2013  . Breast lumpectomy with needle localization and axillary sentinel lymph node bx Left 07/26/2013    Procedure: BREAST LUMPECTOMY WITH NEEDLE LOCALIZATION AND AXILLARY SENTINEL LYMPH NODE BX;  Surgeon: Shann Medal, MD;  Location: [facility];  Service: General;  Laterality: Left;  Marland Kitchen Mole removal Right 07/26/2013    Procedure: MOLE REMOVAL UNDER RIGHT BREAST;  Surgeon: Shann Medal, MD;  Location: [place];  Service: General;  Laterality: Right;  . Portacath placement Right 07/26/2013    Procedure: INSERTION PORT-A-CATH;  Surgeon: Shann Medal, MD;  Location: [facility];  Service: General;  Laterality: Right;    There were no vitals taken for this visit.  Visit Diagnosis:  Pain in both feet  Weakness of both legs      Subjective Assessment - 04/26/14 0901    Symptoms Relief after last treatment lasted about 3 hours.   Currently in Pain? Yes   Pain Score 5    Pain Location Foot   Pain Orientation Right;Left   Pain Descriptors / Indicators Aching;Constant;Dull            [facility] Adult PT Treatment/Exercise - 04/26/14 0001     Knee/Hip Exercises: Stretches   Hip Flexor Stretch 3 reps;10 seconds  Bil with hanging LE off side of mat   Knee/Hip Exercises: Supine   Bridges 10 reps  Bridge with knees open/close 2 sets of 3 reps   Bridges Limitations thighs feel weak   Straight Leg Raises Right;Left;5 reps;2 Risk manager Stimulation Location Bil plantar surfaces of feet   Electrical Stimulation Parameters 80-150 Hz, x15 mins   Electrical Stimulation Goals Pain   Manual Therapy   Manual Therapy Massage   Massage Soft tissue work to [employer] - 04/26/14 1223    Clinical Impression Statement Pt still with fair tolerance of exercise today but was able to increase difficulty of one slightly. PAtient has good temproary relief from manual therapy.   Pt will benefit from skilled therapeutic intervention in order to improve on the following deficits Pain;Decreased strength;Decreased mobility   Rehab Potential Good   PT Frequency 2x / week   PT Duration 4 weeks   PT Treatment/Interventions Electrical Stimulation;Manual techniques;Therapeutic exercise;Patient/family education   PT Next Visit Plan Assess if IFC helped/howl long,  continue with LE strength, manual therapy, and IFC if helpful, possibly try on low back?        Problem List Patient Active Problem List   Diagnosis Date Noted  . Antineoplastic chemotherapy induced anemia(285.3) 01/07/2014  . Low magnesium levels 01/06/2014  . Hemorrhoid 11/15/2013  . Hypokalemia 11/04/2013  . Pedal edema 10/14/2013  . Diarrhea 09/23/2013  . GERD (gastroesophageal reflux disease) 08/23/2013  . Anxiety 08/23/2013  . Breast cancer of lower-outer quadrant of left female breast 06/19/2013                                              Otelia Limes, PTA 04/26/2014, 12:27 PM

## 2014-04-28 ENCOUNTER — Ambulatory Visit: Payer: BC Managed Care – PPO | Admitting: Physical Therapy

## 2014-04-29 ENCOUNTER — Ambulatory Visit: Payer: BC Managed Care – PPO | Admitting: Physical Therapy

## 2014-04-29 DIAGNOSIS — R29898 Other symptoms and signs involving the musculoskeletal system: Secondary | ICD-10-CM

## 2014-04-29 DIAGNOSIS — Z5189 Encounter for other specified aftercare: Secondary | ICD-10-CM | POA: Diagnosis not present

## 2014-04-29 DIAGNOSIS — M79671 Pain in right foot: Secondary | ICD-10-CM

## 2014-04-29 DIAGNOSIS — M79672 Pain in left foot: Principal | ICD-10-CM

## 2014-04-29 NOTE — Therapy (Signed)
[place], [place], [address] Phone: [phone]   Fax:  [phone]  Physical Therapy Treatment  Patient Details  Name: Lori Vincent MRN: [id number] Date of Birth: 06-Jun-1955  Encounter Date: 04/29/2014      PT End of Session - 04/29/14 1344    Visit Number 4   Number of Visits 9   Date for PT Re-Evaluation 05/18/14   PT Start Time 0850   PT Stop Time 0955   PT Time Calculation (min) 65 min      Past Medical History  Diagnosis Date  . Breast cancer 06/03/13    left, 6:00 o'clock  . Depression   . Hypothyroidism   . Hypokalemia   . Anemia due to chemotherapy   . History of radiation therapy 01/18/14- 03/02/14    left breast 4680 cGy 26 sessions, boost of 1000 cGy 5 sessions    Past Surgical History  Procedure Laterality Date  . Tonsillectomy  1972  . Cesarean section  04/1999  . Breast biopsy  1/14    lt  . Dilation and curettage of uterus    . Colonoscopy  2013  . Breast lumpectomy with needle localization and axillary sentinel lymph node bx Left 07/26/2013    Procedure: BREAST LUMPECTOMY WITH NEEDLE LOCALIZATION AND AXILLARY SENTINEL LYMPH NODE BX;  Surgeon: Shann Medal, MD;  Location: [facility];  Service: General;  Laterality: Left;  Marland Kitchen Mole removal Right 07/26/2013    Procedure: MOLE REMOVAL UNDER RIGHT BREAST;  Surgeon: Shann Medal, MD;  Location: [place];  Service: General;  Laterality: Right;  . Portacath placement Right 07/26/2013    Procedure: INSERTION PORT-A-CATH;  Surgeon: Shann Medal, MD;  Location: [facility];  Service: General;  Laterality: Right;    There were no vitals taken for this visit.  Visit Diagnosis:  Pain in both feet  Weakness of both legs      Subjective Assessment - 04/29/14 1338    Symptoms Massage alone gave about four hours' relief; massage and electrical stim gave 24 hours relief.   Currently in Pain? Yes    Pain Score 5    Pain Location Foot   Pain Orientation Right;Left   Aggravating Factors  worse as the day goes on and with being on her feet   Pain Relieving Factors massage and electrical stimulation            [facility] Adult PT Treatment/Exercise - 04/29/14 0001    Knee/Hip Exercises: Supine   Bridges 10 reps   Straight Leg Raises Right;Left;10 reps   Knee/Hip Exercises: Sidelying   Hip ADduction AROM;10 reps   Knee/Hip Exercises: Prone   Hamstring Curl 10 reps   Hip Extension AROM;Right;Left;10 reps   Acupuncturist Location low back  patient wanted to try this to compare to feet; in prone   Electrical Stimulation Action interferential   Electrical Stimulation Parameters 80-150 Hz  20 minutes   Electrical Stimulation Goals Pain   Manual Therapy   Manual Therapy Massage   Massage Soft tissue work to both feet and ankles in prone with Biotone; heelcord manual stretches also done          PT Education - 04/29/14 1343    Education provided Yes   Education Details answered pt.'s questions about electrical stimulation and why we don't use ultrasound in therapy with cancer patients   Person(s) Educated Patient   Methods  Explanation   Comprehension Verbalized understanding              Plan - 04/29/14 1345    Clinical Impression Statement Reports significant and temporary, though lasting some hours, benefit of foot massage and electrical stimulation for pain relief.   Pt will benefit from skilled therapeutic intervention in order to improve on the following deficits Pain;Decreased strength;Decreased mobility   Rehab Potential Good   PT Treatment/Interventions Electrical Stimulation;Manual techniques;Therapeutic exercise;Patient/family education   PT Next Visit Plan Assess whether electrical stimulation was more or less beneficial when electrodes were placed on her back compared to on her feet; continue strengthening and massage                                Problem List Patient Active Problem List   Diagnosis Date Noted  . Antineoplastic chemotherapy induced anemia(285.3) 01/07/2014  . Low magnesium levels 01/06/2014  . Hemorrhoid 11/15/2013  . Hypokalemia 11/04/2013  . Pedal edema 10/14/2013  . Diarrhea 09/23/2013  . GERD (gastroesophageal reflux disease) 08/23/2013  . Anxiety 08/23/2013  . Breast cancer of lower-outer quadrant of left female breast 06/19/2013    Domingue Coltrain 04/29/2014, 1:48 PM  Jettie Lazare, PT

## 2014-05-02 ENCOUNTER — Telehealth: Payer: Self-pay

## 2014-05-02 NOTE — Telephone Encounter (Signed)
pt appt cancelled for 12/10 per Val, pt wants to r/s for 12/11 or 12/14, no available right now so pt is on waiting list, will call pt once  those days open up

## 2014-05-03 ENCOUNTER — Ambulatory Visit: Payer: BC Managed Care – PPO

## 2014-05-03 DIAGNOSIS — Z5189 Encounter for other specified aftercare: Secondary | ICD-10-CM | POA: Diagnosis not present

## 2014-05-03 DIAGNOSIS — M79671 Pain in right foot: Secondary | ICD-10-CM

## 2014-05-03 DIAGNOSIS — R29898 Other symptoms and signs involving the musculoskeletal system: Secondary | ICD-10-CM

## 2014-05-03 DIAGNOSIS — M79672 Pain in left foot: Principal | ICD-10-CM

## 2014-05-03 NOTE — Therapy (Signed)
[place], [place], [address] Phone: [phone]   Fax:  [phone]  Physical Therapy Treatment  Patient Details  Name: Lori Vincent MRN: [id number] Date of Birth: August 02, 1955  Encounter Date: 05/03/2014      PT End of Session - 05/03/14 1038    Visit Number 5   Number of Visits 9   Date for PT Re-Evaluation 05/18/14   PT Start Time 0853   PT Stop Time 0952   PT Time Calculation (min) 59 min      Past Medical History  Diagnosis Date  . Breast cancer 06/03/13    left, 6:00 o'clock  . Depression   . Hypothyroidism   . Hypokalemia   . Anemia due to chemotherapy   . History of radiation therapy 01/18/14- 03/02/14    left breast 4680 cGy 26 sessions, boost of 1000 cGy 5 sessions    Past Surgical History  Procedure Laterality Date  . Tonsillectomy  1972  . Cesarean section  04/1999  . Breast biopsy  1/14    lt  . Dilation and curettage of uterus    . Colonoscopy  2013  . Breast lumpectomy with needle localization and axillary sentinel lymph node bx Left 07/26/2013    Procedure: BREAST LUMPECTOMY WITH NEEDLE LOCALIZATION AND AXILLARY SENTINEL LYMPH NODE BX;  Surgeon: Shann Medal, MD;  Location: [facility];  Service: General;  Laterality: Left;  Marland Kitchen Mole removal Right 07/26/2013    Procedure: MOLE REMOVAL UNDER RIGHT BREAST;  Surgeon: Shann Medal, MD;  Location: [facility];  Service: General;  Laterality: Right;  . Portacath placement Right 07/26/2013    Procedure: INSERTION PORT-A-CATH;  Surgeon: Shann Medal, MD;  Location: [facility];  Service: General;  Laterality: Right;    There were no vitals taken for this visit.  Visit Diagnosis:  Pain in both feet  Weakness of both legs      Subjective Assessment - 05/03/14 0856    Symptoms My feet are feeling achy this morning up to bil calves. Feel like the electrodes were a litle more beneficial on my feet.              [facility] Adult PT Treatment/Exercise - 05/03/14 0001    Knee/Hip Exercises: Supine   Short Arc Quad Sets Both;10 reps  5 sec holds with 1 lb on each ankle   Bridges 10 reps;2 sets   Straight Leg Raises Right;Left;10 reps;2 sets   Electrical Stimulation   Electrical Stimulation Location Bil plantar surfaces of feet   Electrical Stimulation Parameters 80-150 Hz x15 mins   Electrical Stimulation Goals Pain   Manual Therapy   Massage Soft tissue work to [name], including deep tissue at Rt arch                Plan - 05/03/14 1039    Clinical Impression Statement Pt has immediate relief after treatment from manual therapy and IFC. Tolerated bike without increase of CIPN sypmtoms, tingling stayed the same.   Pt will benefit from skilled therapeutic intervention in order to improve on the following deficits Pain;Decreased strength;Decreased mobility   Rehab Potential Good   PT Frequency 2x / week   PT Duration 4 weeks   PT Treatment/Interventions Electrical Stimulation;Manual techniques;Therapeutic exercise;Patient/family education   PT Next Visit Plan Cont strengthening, manual therapy and IFC to bil feet.   Consulted and Agree with Plan of Care Patient  Long Term Clinic Goals - 05/03/14 1041    [facility] Long Term Goal  #1   Title Pt. will report at least 40% decrease in foot pain.   Time 4   Period Weeks   Status On-going   CC Long Term Goal  #2   Title Pt. will be independent in self-pain management for foot pain.   Status On-going   CC Long Term Goal  #3   Title Pt. will be independent in home exercise program for LE strengthing.   Status On-going   CC Long Term Goal  #4   Title Pt. will report at least 25% greater ease in coming to standing from sitting position.   Status On-going         Problem List Patient Active Problem List   Diagnosis Date Noted  . Antineoplastic chemotherapy induced anemia(285.3)  01/07/2014  . Low magnesium levels 01/06/2014  . Hemorrhoid 11/15/2013  . Hypokalemia 11/04/2013  . Pedal edema 10/14/2013  . Diarrhea 09/23/2013  . GERD (gastroesophageal reflux disease) 08/23/2013  . Anxiety 08/23/2013  . Breast cancer of lower-outer quadrant of left female breast 06/19/2013    Otelia Limes, PTA 05/03/2014, 10:44 AM

## 2014-05-05 ENCOUNTER — Encounter: Payer: BC Managed Care – PPO | Admitting: Physical Therapy

## 2014-05-06 ENCOUNTER — Other Ambulatory Visit: Payer: Self-pay | Admitting: *Deleted

## 2014-05-06 ENCOUNTER — Other Ambulatory Visit: Payer: Self-pay

## 2014-05-06 DIAGNOSIS — F419 Anxiety disorder, unspecified: Secondary | ICD-10-CM

## 2014-05-06 DIAGNOSIS — F329 Major depressive disorder, single episode, unspecified: Secondary | ICD-10-CM

## 2014-05-06 DIAGNOSIS — C50512 Malignant neoplasm of lower-outer quadrant of left female breast: Secondary | ICD-10-CM

## 2014-05-06 DIAGNOSIS — F32A Depression, unspecified: Secondary | ICD-10-CM

## 2014-05-06 MED ORDER — CITALOPRAM HYDROBROMIDE 20 MG PO TABS: 20.0000 mg | ORAL_TABLET | Freq: Every day | ORAL | Status: DC

## 2014-05-06 MED ORDER — LORAZEPAM 0.5 MG PO TABS: 0.5000 mg | ORAL_TABLET | Freq: Two times a day (BID) | ORAL | Status: DC | PRN

## 2014-05-09 ENCOUNTER — Ambulatory Visit: Payer: BC Managed Care – PPO

## 2014-05-09 DIAGNOSIS — R29898 Other symptoms and signs involving the musculoskeletal system: Secondary | ICD-10-CM

## 2014-05-09 DIAGNOSIS — M79672 Pain in left foot: Principal | ICD-10-CM

## 2014-05-09 DIAGNOSIS — Z5189 Encounter for other specified aftercare: Secondary | ICD-10-CM | POA: Diagnosis not present

## 2014-05-09 DIAGNOSIS — M79671 Pain in right foot: Secondary | ICD-10-CM

## 2014-05-09 NOTE — Patient Instructions (Signed)
Calf / Gastoc: Runners' Stretch I   One leg back and straight, other forward and bent supporting weight, lean forward, gently stretching calf of back leg. Hold _20-30___ seconds. Repeat with other leg. Repeat __3__ times. Do __3__ sessions per day.  Also do towel stretch in sitting with legs straight and towel at ball of foot  and/or laying trying to make knee straight. Hold 20-30 sec, 2-3 times.  Copyright  VHI. All rights reserved.

## 2014-05-09 NOTE — Therapy (Signed)
[place], [place], [address] Phone: [phone]   Fax:  [phone]  Physical Therapy Treatment  Patient Details  Name: Lori Vincent MRN: [id number] Date of Birth: [birth year]  Encounter Date: 05/09/2014      PT End of Session - 05/09/14 0938    Visit Number 6   Number of Visits 9   Date for PT Re-Evaluation 05/18/14   PT Start Time 0852   PT Stop Time 0946   PT Time Calculation (min) 54 min      Past Medical History  Diagnosis Date  . Breast cancer 06/03/13    left, 6:00 o'clock  . Depression   . Hypothyroidism   . Hypokalemia   . Anemia due to chemotherapy   . History of radiation therapy 01/18/14- 03/02/14    left breast 4680 cGy 26 sessions, boost of 1000 cGy 5 sessions    Past Surgical History  Procedure Laterality Date  . Tonsillectomy  1972  . Cesarean section  04/1999  . Breast biopsy  1/14    lt  . Dilation and curettage of uterus    . Colonoscopy  2013  . Breast lumpectomy with needle localization and axillary sentinel lymph node bx Left 07/26/2013    Procedure: BREAST LUMPECTOMY WITH NEEDLE LOCALIZATION AND AXILLARY SENTINEL LYMPH NODE BX;  Surgeon: Shann Medal, MD;  Location: [facility];  Service: General;  Laterality: Left;  Marland Kitchen Mole removal Right 07/26/2013    Procedure: MOLE REMOVAL UNDER RIGHT BREAST;  Surgeon: Shann Medal, MD;  Location: [facility];  Service: General;  Laterality: Right;  . Portacath placement Right 07/26/2013    Procedure: INSERTION PORT-A-CATH;  Surgeon: Shann Medal, MD;  Location: [facility];  Service: General;  Laterality: Right;    There were no vitals taken for this visit.  Visit Diagnosis:  Pain in both feet  Weakness of both legs      Subjective Assessment - 05/09/14 0858    Symptoms Now getting 2 or 3 days of relief after each treatment, and starting to be able to get up from a chair easier. Noticing some  improvement!            [facility] Adult PT Treatment/Exercise - 05/09/14 0001    Electrical Stimulation   Electrical Stimulation Location Bil plantar surfaces of feet at arch and at anterior ankle   Electrical Stimulation Parameters 80-150 HZ x15 mins   Manual Therapy   Massage Soft to deep tissue work to [name] with PROM stretching to bil gastroc throughout.           PT Education - 05/09/14 [phone]    Education provided No   Education Details Gastroc/plantar fascitis stretches; instructed in importance of correct heel/toe pattern with gait.   Person(s) Educated Patient   Methods Explanation;Demonstration;Handout   Comprehension Verbalized understanding              Plan - 05/09/14 0938    Clinical Impression Statement Tried 1 electrode at bil anterior ankles today. Added gastroc/plantar fascitis stretches. Also pt to begin to try heel/toe gait as pain allows with ADLs.    Pt will benefit from skilled therapeutic intervention in order to improve on the following deficits Pain;Decreased strength;Decreased mobility   Rehab Potential Good   PT Frequency 2x / week   PT Duration 4 weeks   PT Treatment/Interventions Electrical Stimulation;Manual techniques;Therapeutic exercise;Patient/family education   PT Next Visit Plan Resume  bike and begin some proprioceptive exercises next visit. Cont manual techniques and IFC to bil feet.   PT Home Exercise Plan See instructions given.   Consulted and Agree with Plan of Care Patient                               Problem List Patient Active Problem List   Diagnosis Date Noted  . Antineoplastic chemotherapy induced anemia(285.3) 01/07/2014  . Low magnesium levels 01/06/2014  . Hemorrhoid 11/15/2013  . Hypokalemia 11/04/2013  . Pedal edema 10/14/2013  . Diarrhea 09/23/2013  . GERD (gastroesophageal reflux disease) 08/23/2013  . Anxiety 08/23/2013  . Breast cancer of lower-outer quadrant of left female breast  06/19/2013    Otelia Limes, PTA 05/09/2014, 9:54 AM

## 2014-05-11 ENCOUNTER — Ambulatory Visit: Payer: BC Managed Care – PPO

## 2014-05-11 DIAGNOSIS — M79671 Pain in right foot: Secondary | ICD-10-CM

## 2014-05-11 DIAGNOSIS — R29898 Other symptoms and signs involving the musculoskeletal system: Secondary | ICD-10-CM

## 2014-05-11 DIAGNOSIS — M79672 Pain in left foot: Principal | ICD-10-CM

## 2014-05-11 DIAGNOSIS — Z5189 Encounter for other specified aftercare: Secondary | ICD-10-CM | POA: Diagnosis not present

## 2014-05-11 NOTE — Therapy (Signed)
[place], [place], [address] Phone: [phone]   Fax:  [phone]  Physical Therapy Treatment  Patient Details  Name: Lori Vincent MRN: [id number] Date of Birth: 03-09-1956  Encounter Date: 05/11/2014      PT End of Session - 05/11/14 0956    Visit Number 7   Number of Visits 9   Date for PT Re-Evaluation 05/18/14   PT Start Time 0932   PT Stop Time 7253   PT Time Calculation (min) 60 min      Past Medical History  Diagnosis Date  . Breast cancer 06/03/13    left, 6:00 o'clock  . Depression   . Hypothyroidism   . Hypokalemia   . Anemia due to chemotherapy   . History of radiation therapy 01/18/14- 03/02/14    left breast 4680 cGy 26 sessions, boost of 1000 cGy 5 sessions    Past Surgical History  Procedure Laterality Date  . Tonsillectomy  1972  . Cesarean section  04/1999  . Breast biopsy  1/14    lt  . Dilation and curettage of uterus    . Colonoscopy  2013  . Breast lumpectomy with needle localization and axillary sentinel lymph node bx Left 07/26/2013    Procedure: BREAST LUMPECTOMY WITH NEEDLE LOCALIZATION AND AXILLARY SENTINEL LYMPH NODE BX;  Surgeon: Shann Medal, MD;  Location: [facility];  Service: General;  Laterality: Left;  Marland Kitchen Mole removal Right 07/26/2013    Procedure: MOLE REMOVAL UNDER RIGHT BREAST;  Surgeon: Shann Medal, MD;  Location: [facility];  Service: General;  Laterality: Right;  . Portacath placement Right 07/26/2013    Procedure: INSERTION PORT-A-CATH;  Surgeon: Shann Medal, MD;  Location: [facility];  Service: General;  Laterality: Right;    There were no vitals taken for this visit.  Visit Diagnosis:  Pain in both feet  Weakness of both legs      Subjective Assessment - 05/11/14 0936    Symptoms Have really been trying to use heel/toe gait pattern and it hurts! Can tell how tight the top of my foot is.            [facility] Adult PT Treatment/Exercise - 05/11/14 0001    Knee/Hip Exercises: Standing   Lateral Step Up 5 reps;Hand Hold: 1;Step Height: 6"   Forward Step Up 5 reps;Both;Hand Hold: 1;Step Height: 6"  Challenging for pt, but better with heel/toe   Electrical Stimulation   Electrical Stimulation Location Bil dorsal surface near toes, and bil posterior to arch   Electrical Stimulation Parameters 80-150 Hz x15 mins   Manual Therapy   Massage Soft tissue work to Facilities manager Bike Level 1 x5 mins   Tandem Walking 2 round trips  15 ft each, 1 UE support                Plan - 05/11/14 1011    Clinical Impression Statement Pt tolerated new exercises well today, the tandem gait was challenging for pt and she reports could feel muscles working. Pt making good progress with correcting gait pattern.   Pt will benefit from skilled therapeutic intervention in order to improve on the following deficits Pain;Decreased strength;Decreased mobility   Rehab Potential Good   PT Frequency 2x / week   PT Duration 4 weeks   PT Treatment/Interventions Electrical Stimulation;Manual techniques;Therapeutic exercise;Patient/family education   PT Next Visit Plan Pt would  like to continue coming after Christmas, will need a renewal at that time. Cont bike and proprioceptive exercises and manual techniques and IFC to bil feet.                              Long Term Clinic Goals - 05/11/14 1013    CC Long Term Goal  #1   Title Pt. will report at least 40% decrease in foot pain.  Pt reports 20% improvement   Time 4   Period Weeks   Status On-going   CC Long Term Goal  #2   Title Pt. will be independent in self-pain management for foot pain.   Status On-going   CC Long Term Goal  #3   Title Pt. will be independent in home exercise program for LE strengthing.   Status On-going   CC Long Term Goal  #4   Title Pt. will report at least 25% greater ease in  coming to standing from sitting position.  10% improvement   Status On-going         Problem List Patient Active Problem List   Diagnosis Date Noted  . Antineoplastic chemotherapy induced anemia(285.3) 01/07/2014  . Low magnesium levels 01/06/2014  . Hemorrhoid 11/15/2013  . Hypokalemia 11/04/2013  . Pedal edema 10/14/2013  . Diarrhea 09/23/2013  . GERD (gastroesophageal reflux disease) 08/23/2013  . Anxiety 08/23/2013  . Breast cancer of lower-outer quadrant of left female breast 06/19/2013    Otelia Limes, PTA 05/11/2014, 10:20 AM

## 2014-05-16 ENCOUNTER — Ambulatory Visit (HOSPITAL_BASED_OUTPATIENT_CLINIC_OR_DEPARTMENT_OTHER): Payer: BC Managed Care – PPO

## 2014-05-16 DIAGNOSIS — C50512 Malignant neoplasm of lower-outer quadrant of left female breast: Secondary | ICD-10-CM

## 2014-05-16 DIAGNOSIS — Z5111 Encounter for antineoplastic chemotherapy: Secondary | ICD-10-CM

## 2014-05-16 DIAGNOSIS — C50812 Malignant neoplasm of overlapping sites of left female breast: Secondary | ICD-10-CM

## 2014-05-16 DIAGNOSIS — Z452 Encounter for adjustment and management of vascular access device: Secondary | ICD-10-CM

## 2014-05-16 LAB — COMPREHENSIVE METABOLIC PANEL (CC13)
ALT: 19 U/L (ref 0–55)
AST: 17 U/L (ref 5–34)
Albumin: 3.8 g/dL (ref 3.5–5.0)
Alkaline Phosphatase: 83 U/L (ref 40–150)
Anion Gap: 9 mEq/L (ref 3–11)
BILIRUBIN TOTAL: 0.52 mg/dL (ref 0.20–1.20)
BUN: 19.2 mg/dL (ref 7.0–26.0)
CO2: 29 mEq/L (ref 22–29)
Calcium: 9.3 mg/dL (ref 8.4–10.4)
Chloride: 102 mEq/L (ref 98–109)
Creatinine: 0.8 mg/dL (ref 0.6–1.1)
EGFR: 81 mL/min/{1.73_m2} — AB (ref >90)
Glucose: 110 mg/dl (ref 70–140)
Potassium: 3.6 mEq/L (ref 3.5–5.1)
SODIUM: 140 meq/L (ref 136–145)
TOTAL PROTEIN: 6.5 g/dL (ref 6.4–8.3)

## 2014-05-16 LAB — CBC WITH DIFFERENTIAL/PLATELET
BASO%: 0.6 % (ref 0.0–2.0)
Basophils Absolute: 0 10*3/uL (ref 0.0–0.1)
EOS%: 4.5 % (ref 0.0–7.0)
Eosinophils Absolute: 0.2 10*3/uL (ref 0.0–0.5)
HCT: 37.1 % (ref 34.8–46.6)
HGB: 11.8 g/dL (ref 11.6–15.9)
LYMPH%: 23.7 % (ref 14.0–49.7)
MCH: 28.5 pg (ref 25.1–34.0)
MCHC: 31.8 g/dL (ref 31.5–36.0)
MCV: 89.7 fL (ref 79.5–101.0)
MONO#: 0.5 10*3/uL (ref 0.1–0.9)
MONO%: 11.4 % (ref 0.0–14.0)
NEUT#: 2.6 10*3/uL (ref 1.5–6.5)
NEUT%: 59.8 % (ref 38.4–76.8)
Platelets: 193 10*3/uL (ref 145–400)
RBC: 4.13 10*6/uL (ref 3.70–5.45)
RDW: 13.6 % (ref 11.2–14.5)
WBC: 4.3 10*3/uL (ref 3.9–10.3)
lymph#: 1 10*3/uL (ref 0.9–3.3)

## 2014-05-16 MED ORDER — HEPARIN SOD (PORK) LOCK FLUSH 100 UNIT/ML IV SOLN
500.0000 [IU] | Freq: Once | INTRAVENOUS | Status: AC | PRN
  Administered 2014-05-16: 500 [IU]
  Filled 2014-05-16: qty 5

## 2014-05-16 MED ORDER — SODIUM CHLORIDE 0.9 % IJ SOLN
10.0000 mL | INTRAMUSCULAR | Status: DC | PRN
  Administered 2014-05-16: 10 mL
  Filled 2014-05-16: qty 10

## 2014-05-16 MED ORDER — TRASTUZUMAB CHEMO INJECTION 440 MG
6.0000 mg/kg | Freq: Once | INTRAVENOUS | Status: AC
  Administered 2014-05-16: 567 mg via INTRAVENOUS
  Filled 2014-05-16: qty 27

## 2014-05-16 MED ORDER — SODIUM CHLORIDE 0.9 % IV SOLN
Freq: Once | INTRAVENOUS | Status: AC
  Administered 2014-05-16: 10:00:00 via INTRAVENOUS

## 2014-05-16 MED ORDER — DIPHENHYDRAMINE HCL 25 MG PO CAPS
ORAL_CAPSULE | ORAL | Status: AC
Start: 2014-05-16 — End: 2014-05-16
  Filled 2014-05-16: qty 1

## 2014-05-16 MED ORDER — ACETAMINOPHEN 325 MG PO TABS
650.0000 mg | ORAL_TABLET | Freq: Once | ORAL | Status: AC
  Administered 2014-05-16: 650 mg via ORAL

## 2014-05-16 MED ORDER — ALTEPLASE 2 MG IJ SOLR
2.0000 mg | Freq: Once | INTRAMUSCULAR | Status: AC | PRN
  Administered 2014-05-16: 2 mg
  Filled 2014-05-16: qty 2

## 2014-05-16 MED ORDER — DIPHENHYDRAMINE HCL 25 MG PO CAPS
25.0000 mg | ORAL_CAPSULE | Freq: Once | ORAL | Status: AC
  Administered 2014-05-16: 25 mg via ORAL

## 2014-05-16 MED ORDER — ACETAMINOPHEN 325 MG PO TABS
ORAL_TABLET | ORAL | Status: AC
  Filled 2014-05-16: qty 2

## 2014-05-16 NOTE — Patient Instructions (Signed)
[facility] Discharge Instructions for Patients Receiving Chemotherapy  Today you received the following chemotherapy agents: Herceptin  To help prevent nausea and vomiting after your treatment, we encourage you to take your nausea medication as prescribed by your physician.    If you develop nausea and vomiting that is not controlled by your nausea medication, call the clinic.   BELOW ARE SYMPTOMS THAT SHOULD BE REPORTED IMMEDIATELY:  *FEVER GREATER THAN 100.5 F  *CHILLS WITH OR WITHOUT FEVER  NAUSEA AND VOMITING THAT IS NOT CONTROLLED WITH YOUR NAUSEA MEDICATION  *UNUSUAL SHORTNESS OF BREATH  *UNUSUAL BRUISING OR BLEEDING  TENDERNESS IN MOUTH AND THROAT WITH OR WITHOUT PRESENCE OF ULCERS  *URINARY PROBLEMS  *BOWEL PROBLEMS  UNUSUAL RASH Items with * indicate a potential emergency and should be followed up as soon as possible.  Feel free to call the clinic you have any questions or concerns. The clinic phone number is [phone].    

## 2014-05-17 ENCOUNTER — Ambulatory Visit: Payer: BC Managed Care – PPO

## 2014-05-17 DIAGNOSIS — Z5189 Encounter for other specified aftercare: Secondary | ICD-10-CM | POA: Diagnosis not present

## 2014-05-17 DIAGNOSIS — M79672 Pain in left foot: Principal | ICD-10-CM

## 2014-05-17 DIAGNOSIS — M79671 Pain in right foot: Secondary | ICD-10-CM

## 2014-05-17 DIAGNOSIS — R29898 Other symptoms and signs involving the musculoskeletal system: Secondary | ICD-10-CM

## 2014-05-17 NOTE — Therapy (Signed)
[place], [place], [address] Phone: [phone]   Fax:  [phone]  Physical Therapy Treatment  Patient Details  Name: Lori Vincent MRN: [id number] Date of Birth: 05/14/1956  Encounter Date: 05/17/2014      PT End of Session - 05/17/14 1106    Visit Number 8   Number of Visits 9   Date for PT Re-Evaluation 05/18/14   PT Start Time 1022   PT Stop Time 1119   PT Time Calculation (min) 57 min      Past Medical History  Diagnosis Date  . Breast cancer 06/03/13    left, 6:00 o'clock  . Depression   . Hypothyroidism   . Hypokalemia   . Anemia due to chemotherapy   . History of radiation therapy 01/18/14- 03/02/14    left breast 4680 cGy 26 sessions, boost of 1000 cGy 5 sessions    Past Surgical History  Procedure Laterality Date  . Tonsillectomy  1972  . Cesarean section  04/1999  . Breast biopsy  1/14    lt  . Dilation and curettage of uterus    . Colonoscopy  2013  . Breast lumpectomy with needle localization and axillary sentinel lymph node bx Left 07/26/2013    Procedure: BREAST LUMPECTOMY WITH NEEDLE LOCALIZATION AND AXILLARY SENTINEL LYMPH NODE BX;  Surgeon: Shann Medal, MD;  Location: [facility];  Service: General;  Laterality: Left;  Marland Kitchen Mole removal Right 07/26/2013    Procedure: MOLE REMOVAL UNDER RIGHT BREAST;  Surgeon: Shann Medal, MD;  Location: [facility];  Service: General;  Laterality: Right;  . Portacath placement Right 07/26/2013    Procedure: INSERTION PORT-A-CATH;  Surgeon: Shann Medal, MD;  Location: [facility];  Service: General;  Laterality: Right;    There were no vitals taken for this visit.  Visit Diagnosis:  Pain in both feet  Weakness of both legs      Subjective Assessment - 05/17/14 1025    Symptoms My feet are starting to feel better, have been noticing though that the tops of my feet have been really sore  and I just realized I have been clenching my feet muscles! So started telling myself to relax my feet muscles, and have noticed a difference. Im definitely more aware of how I walk now.                    [facility] Adult PT Treatment/Exercise - 05/17/14 0001    Knee/Hip Exercises: Stretches   Active Hamstring Stretch 3 reps;20 seconds  Bil   Hip Flexor Stretch 3 reps   ITB Stretch 1 rep;10 seconds  Could only feel stretch on Rt   Piriformis Stretch 3 reps;20 seconds  Bil   Gastroc Stretch 3 reps;20 seconds   Knee/Hip Exercises: Aerobic   Stationary Bike Level 1 x7 mins   Knee/Hip Exercises: Standing   Rocker Board 2 minutes  Front-back   Electrical Stimulation   Electrical Stimulation Location Bil dorsal surface near toes, and bil posterior to arch   Electrical Stimulation Parameters 80-150 Hz x15 mins   Electrical Stimulation Goals Pain   Manual Therapy   Massage Soft tissue work to [name]                PT Education - 05/17/14 1209    Education provided Yes   Education Details Bil LE stretches    Person(s) Educated Patient  Methods Explanation;Demonstration;Verbal cues   Comprehension Verbalized understanding;Returned demonstration                Long Term Clinic Goals - 05/17/14 1213    CC Long Term Goal  #1   Title Pt. will report at least 40% decrease in foot pain.   Period Weeks   Status On-going   CC Long Term Goal  #2   Title Pt. will be independent in self-pain management for foot pain.   Status On-going   CC Long Term Goal  #3   Title Pt. will be independent in home exercise program for LE strengthing.   Status On-going   CC Long Term Goal  #4   Title Pt. will report at least 25% greater ease in coming to standing from sitting position.   Status On-going            Plan - 05/17/14 1210    Clinical Impression Statement Pt had good understanding of bil LEs stretches today and importance of them. Also is making good progress  with correction of gait pattern, though she does still have alot of pain when doing so in retraining the muscles.   Pt will benefit from skilled therapeutic intervention in order to improve on the following deficits Pain;Decreased strength;Decreased mobility   Rehab Potential Good   PT Frequency 2x / week   PT Duration 4 weeks   PT Treatment/Interventions Electrical Stimulation;Manual techniques;Therapeutic exercise;Patient/family education   PT Next Visit Plan Pt would like to continue coming after Christmas, will need a renewal at that time. Cont bike and proprioceptive exercises and manual techniques and IFC to bil feet.   PT Home Exercise Plan Bil LE stretching   Consulted and Agree with Plan of Care Patient        Problem List Patient Active Problem List   Diagnosis Date Noted  . Antineoplastic chemotherapy induced anemia(285.3) 01/07/2014  . Low magnesium levels 01/06/2014  . Hemorrhoid 11/15/2013  . Hypokalemia 11/04/2013  . Pedal edema 10/14/2013  . Diarrhea 09/23/2013  . GERD (gastroesophageal reflux disease) 08/23/2013  . Anxiety 08/23/2013  . Breast cancer of lower-outer quadrant of left female breast 06/19/2013    Otelia Limes, PTA 05/17/2014, 12:17 PM  [place], [place], [address] Phone: [phone]   Fax:  [phone]

## 2014-05-18 ENCOUNTER — Ambulatory Visit: Payer: BC Managed Care – PPO | Admitting: Physical Therapy

## 2014-05-18 ENCOUNTER — Ambulatory Visit: Payer: BC Managed Care – PPO

## 2014-05-31 ENCOUNTER — Ambulatory Visit: Payer: BLUE CROSS/BLUE SHIELD | Attending: Oncology

## 2014-05-31 DIAGNOSIS — R29898 Other symptoms and signs involving the musculoskeletal system: Secondary | ICD-10-CM | POA: Diagnosis not present

## 2014-05-31 DIAGNOSIS — C50512 Malignant neoplasm of lower-outer quadrant of left female breast: Secondary | ICD-10-CM | POA: Insufficient documentation

## 2014-05-31 DIAGNOSIS — Z5189 Encounter for other specified aftercare: Secondary | ICD-10-CM | POA: Insufficient documentation

## 2014-05-31 DIAGNOSIS — M79671 Pain in right foot: Secondary | ICD-10-CM | POA: Diagnosis not present

## 2014-05-31 DIAGNOSIS — M79672 Pain in left foot: Secondary | ICD-10-CM | POA: Insufficient documentation

## 2014-05-31 NOTE — Therapy (Signed)
[place], [place], [address] Phone: [phone]   Fax:  [phone]  Physical Therapy Treatment  Patient Details  Name: Lori Vincent MRN: [id number] Date of Birth: 16-Jan-1956  Encounter Date: 05/31/2014      PT End of Session - 05/31/14 0939    Visit Number 9   Number of Visits 9   PT Start Time 2505   PT Stop Time 0952   PT Time Calculation (min) 53 min      Past Medical History  Diagnosis Date  . Breast cancer 06/03/13    left, 6:00 o'clock  . Depression   . Hypothyroidism   . Hypokalemia   . Anemia due to chemotherapy   . History of radiation therapy 01/18/14- 03/02/14    left breast 4680 cGy 26 sessions, boost of 1000 cGy 5 sessions    Past Surgical History  Procedure Laterality Date  . Tonsillectomy  1972  . Cesarean section  04/1999  . Breast biopsy  1/14    lt  . Dilation and curettage of uterus    . Colonoscopy  2013  . Breast lumpectomy with needle localization and axillary sentinel lymph node bx Left 07/26/2013    Procedure: BREAST LUMPECTOMY WITH NEEDLE LOCALIZATION AND AXILLARY SENTINEL LYMPH NODE BX;  Surgeon: Shann Medal, MD;  Location: [facility];  Service: General;  Laterality: Left;  Marland Kitchen Mole removal Right 07/26/2013    Procedure: MOLE REMOVAL UNDER RIGHT BREAST;  Surgeon: Shann Medal, MD;  Location: [facility];  Service: General;  Laterality: Right;  . Portacath placement Right 07/26/2013    Procedure: INSERTION PORT-A-CATH;  Surgeon: Shann Medal, MD;  Location: [facility];  Service: General;  Laterality: Right;    There were no vitals taken for this visit.  Visit Diagnosis:  Pain in both feet  Weakness of both legs      Subjective Assessment - 05/31/14 0902    Symptoms Am slowly starting to have less pain with proper heel/toe gait pattern and though I still have to remind myself to unclench my toes when I walk, I am  noticing its slightly less often, maybe 10% improved.                    [facility] Adult PT Treatment/Exercise - 05/31/14 0001    Knee/Hip Exercises: Stretches   Active Hamstring Stretch 3 reps;20 seconds  Bil   Piriformis Stretch 2 reps;20 seconds  Bil   Gastroc Stretch 2 reps;30 seconds  Bil with hands on wall   Knee/Hip Exercises: Aerobic   Stationary Bike Level 1 x5 mins.   Acupuncturist Location Bil dorsal surface near toes, and bil posterior to arch   Electrical Stimulation Parameters 80-150 Hz x15 mins   Electrical Stimulation Goals Pain   Manual Therapy   Massage Soft tissue work to [employer] Exercises: Standing   Tandem Stance 2 reps;30 secs;Foam;Eyes open;Intermittent HHA                        Long Term Clinic Goals - 05/17/14 1213    CC Long Term Goal  #1   Title Pt. will report at least 40% decrease in foot pain.   Period Weeks   Status On-going   CC Long Term Goal  #2   Title Pt. will be independent in self-pain management for foot  pain.   Status On-going   CC Long Term Goal  #3   Title Pt. will be independent in home exercise program for LE strengthing.   Status On-going   CC Long Term Goal  #4   Title Pt. will report at least 25% greater ease in coming to standing from sitting position.   Status On-going            Plan - 05/31/14 6578    Clinical Impression Statement Pt running late for appt today. Is able to get up/down with slightl greater ease from a chair, but overall still with difficulties with prolonged walking and balance.   Pt will benefit from skilled therapeutic intervention in order to improve on the following deficits Pain;Decreased strength;Decreased mobility   Rehab Potential Good   PT Treatment/Interventions Electrical Stimulation;Manual techniques;Therapeutic exercise;Patient/family education   PT Next Visit Plan Cont bike and proprioceptive exercises and manual  techniques and IFC to bil feet.   PT Home Exercise Plan Bil LE stretching        Problem List Patient Active Problem List   Diagnosis Date Noted  . Antineoplastic chemotherapy induced anemia(285.3) 01/07/2014  . Low magnesium levels 01/06/2014  . Hemorrhoid 11/15/2013  . Hypokalemia 11/04/2013  . Pedal edema 10/14/2013  . Diarrhea 09/23/2013  . GERD (gastroesophageal reflux disease) 08/23/2013  . Anxiety 08/23/2013  . Breast cancer of lower-outer quadrant of left female breast 06/19/2013    Otelia Limes, PTA 05/31/2014, 9:43 AM  [place], [place], [address] Phone: [phone]   Fax:  [phone]

## 2014-06-01 ENCOUNTER — Ambulatory Visit
Admission: RE | Admit: 2014-06-01 | Discharge: 2014-06-01 | Disposition: A | Discharge status: Home / Self Care | Payer: BLUE CROSS/BLUE SHIELD | Source: Ambulatory Visit | Attending: Oncology | Admitting: Oncology

## 2014-06-01 DIAGNOSIS — Z853 Personal history of malignant neoplasm of breast: Secondary | ICD-10-CM

## 2014-06-02 ENCOUNTER — Ambulatory Visit: Payer: BLUE CROSS/BLUE SHIELD | Admitting: Physical Therapy

## 2014-06-02 DIAGNOSIS — M79672 Pain in left foot: Principal | ICD-10-CM

## 2014-06-02 DIAGNOSIS — R29898 Other symptoms and signs involving the musculoskeletal system: Secondary | ICD-10-CM

## 2014-06-02 DIAGNOSIS — I972 Postmastectomy lymphedema syndrome: Secondary | ICD-10-CM

## 2014-06-02 DIAGNOSIS — Z5189 Encounter for other specified aftercare: Secondary | ICD-10-CM | POA: Diagnosis not present

## 2014-06-02 DIAGNOSIS — M79671 Pain in right foot: Secondary | ICD-10-CM

## 2014-06-02 NOTE — Patient Instructions (Addendum)
   Partial squat with "power up" to intiate and complet hip and knee extension with better speed and force  Toe extension and flexion by pulling up a towel with toes and then pushing it away.

## 2014-06-02 NOTE — Therapy (Signed)
[place], [place], [address] Phone: [phone]   Fax:  [phone]  Physical Therapy Treatment  Patient Details  Name: Lori Vincent MRN: [id number] Date of Birth: 24-Oct-1955 Referring Provider:  [name], NP  Encounter Date: 06/02/2014      PT End of Session - 06/02/14 1243    Visit Number 10   Number of Visits 21   Date for PT Re-Evaluation 07/14/14   PT Start Time 0846   PT Stop Time 0955   PT Time Calculation (min) 69 min      Past Medical History  Diagnosis Date  . Breast cancer 06/03/13    left, 6:00 o'clock  . Depression   . Hypothyroidism   . Hypokalemia   . Anemia due to chemotherapy   . History of radiation therapy 01/18/14- 03/02/14    left breast 4680 cGy 26 sessions, boost of 1000 cGy 5 sessions    Past Surgical History  Procedure Laterality Date  . Tonsillectomy  1972  . Cesarean section  04/1999  . Breast biopsy  1/14    lt  . Dilation and curettage of uterus    . Colonoscopy  2013  . Breast lumpectomy with needle localization and axillary sentinel lymph node bx Left 07/26/2013    Procedure: BREAST LUMPECTOMY WITH NEEDLE LOCALIZATION AND AXILLARY SENTINEL LYMPH NODE BX;  Surgeon: Shann Medal, MD;  Location: [facility];  Service: General;  Laterality: Left;  Marland Kitchen Mole removal Right 07/26/2013    Procedure: MOLE REMOVAL UNDER RIGHT BREAST;  Surgeon: Shann Medal, MD;  Location: [facility];  Service: General;  Laterality: Right;  . Portacath placement Right 07/26/2013    Procedure: INSERTION PORT-A-CATH;  Surgeon: Shann Medal, MD;  Location: [facility];  Service: General;  Laterality: Right;    There were no vitals taken for this visit.  Visit Diagnosis:  Pain in both feet  Weakness of both legs  Lymphedema syndrome, postmastectomy      Subjective Assessment - 06/02/14 1259    Symptoms continues with achy waves of  pain in lower legs when walking. continues with weaknes in thighs. She also compains of pain in left lateral trunk and below breast that she feels should be gone by now.     Pain Onset More than a month ago   Aggravating Factors  worse with trying to walk fast   Pain Relieving Factors massage and electrical stimulation                    [facility] Adult PT Treatment/Exercise - 06/02/14 1244    Knee/Hip Exercises: Stretches   Active Hamstring Stretch 3 reps;20 seconds  Bil   Piriformis Stretch 2 reps;20 seconds  Bil   Gastroc Stretch 2 reps;30 seconds  Bil with hands on wall   Knee/Hip Exercises: Aerobic   Stationary Bike Level 1 x 10 minutes.   Moist Heat Therapy   Number Minutes Moist Heat 10 Minutes   Moist Heat Location Other (comment)  both feet plantar and dorsal surface prior to TNS and Dispensing optician Stimulation Location Bil dorsal surface near toes, and bil posterior to arch  used TNS unit one foot at a time, constant mode.   Electrical Stimulation Parameters --  pt regulated intesity   Electrical Stimulation Goals Pain   Manual Therapy   Manual Therapy Massage   Massage soft tissue  work to both feet especially to trigger point and deep stretch to plantar fascial on left foot.   Ankle Exercises: Seated   Towel Crunch 5 reps  also towel push away   Balance Exercises: Standing   Tandem Stance 2 reps;30 secs;Foam;Eyes open;Intermittent HHA             PT Education - 06/02/14 1242    Education provided Yes   Education Details squats and toe exercises   Person(s) Educated Patient   Methods Explanation;Verbal cues   Comprehension Returned demonstration;Verbalized understanding       sent renewal request to Dr. Jana Hakim with plan include manual lymph drainage instruction Sent prescription request to Dr. Jana Hakim for TNS unit and for compression bra. (2 separate scripts)         [place] - 06/02/14 1317     CC Long Term Goal  #1   Title Pt. will report at least 40% decrease in foot pain.   Time 6   Period Weeks   Status On-going   CC Long Term Goal  #2   Title Pt. will be independent in self-pain management for foot pain.   Time 6   Period Weeks   Status On-going   CC Long Term Goal  #3   Title Pt. will be independent in home exercise program for LE strengthing.   Time 6   Period Weeks   Status On-going   CC Long Term Goal  #4   Title Pt. will report at least 25% greater ease in coming to standing from sitting position.   Time 6   Period Weeks   Status On-going   CC Long Term Goal  #5   Title pt will report a 50% reduction in left lateral chest pain.   Time 6   Period Weeks   Status New            Plan - 06/02/14 1250    Clinical Impression Statement Patient continues with lower exremity weakness especially in plantarflexors, and pain in feet.  She is also complaining of pain in left underarm area that might be from swelling.  Discussed use of a bra with high sides and showed her sample WearEase bra if she wants to go to [facility] [place].  Initiated trial of TNS unit.  Pt may be interested in trying one for home. Durene Cal renewal  to [facility] to include request for TNS unti and compression bra with manual lymph draianage insttruction to left chest.    PT Next Visit Plan Continue with exercise for LEs. Add supine theraband to plantarflexors.Continue with estim or TNS trial      If prescriptions/recert  returned from Dr. Vinie Sill, do TNS trial or home  Consider adding manual lymph drainage to left chest.   Problem List Patient Active Problem List   Diagnosis Date Noted  . Antineoplastic chemotherapy induced anemia(285.3) 01/07/2014  . Low magnesium levels 01/06/2014  . Hemorrhoid 11/15/2013  . Hypokalemia 11/04/2013  . Pedal edema 10/14/2013  . Diarrhea 09/23/2013  . GERD (gastroesophageal reflux disease) 08/23/2013  . Anxiety 08/23/2013  . Breast cancer of lower-outer  quadrant of left female breast 06/19/2013   Donato Heinz. Owens Shark, PT  06/02/2014, 1:18 PM  [place], [place], [address] Phone: [phone]   Fax:  [phone]

## 2014-06-06 ENCOUNTER — Other Ambulatory Visit (HOSPITAL_BASED_OUTPATIENT_CLINIC_OR_DEPARTMENT_OTHER): Payer: BLUE CROSS/BLUE SHIELD

## 2014-06-06 ENCOUNTER — Ambulatory Visit (HOSPITAL_BASED_OUTPATIENT_CLINIC_OR_DEPARTMENT_OTHER): Payer: BLUE CROSS/BLUE SHIELD

## 2014-06-06 ENCOUNTER — Telehealth: Payer: Self-pay | Admitting: Oncology

## 2014-06-06 ENCOUNTER — Ambulatory Visit: Payer: BLUE CROSS/BLUE SHIELD

## 2014-06-06 ENCOUNTER — Encounter: Payer: Self-pay | Admitting: *Deleted

## 2014-06-06 ENCOUNTER — Ambulatory Visit (HOSPITAL_BASED_OUTPATIENT_CLINIC_OR_DEPARTMENT_OTHER): Payer: BLUE CROSS/BLUE SHIELD | Admitting: Oncology

## 2014-06-06 ENCOUNTER — Encounter: Payer: Self-pay | Admitting: Oncology

## 2014-06-06 VITALS — BP 141/60 | HR 70 | Temp 98.7°F | Resp 18 | Ht 68.0 in | Wt 211.8 lb

## 2014-06-06 DIAGNOSIS — Z5112 Encounter for antineoplastic immunotherapy: Secondary | ICD-10-CM

## 2014-06-06 DIAGNOSIS — C50512 Malignant neoplasm of lower-outer quadrant of left female breast: Secondary | ICD-10-CM

## 2014-06-06 DIAGNOSIS — I972 Postmastectomy lymphedema syndrome: Secondary | ICD-10-CM

## 2014-06-06 DIAGNOSIS — K219 Gastro-esophageal reflux disease without esophagitis: Secondary | ICD-10-CM

## 2014-06-06 DIAGNOSIS — Z5189 Encounter for other specified aftercare: Secondary | ICD-10-CM | POA: Diagnosis not present

## 2014-06-06 DIAGNOSIS — M79672 Pain in left foot: Principal | ICD-10-CM

## 2014-06-06 DIAGNOSIS — C50812 Malignant neoplasm of overlapping sites of left female breast: Secondary | ICD-10-CM

## 2014-06-06 DIAGNOSIS — Z170 Estrogen receptor positive status [ER+]: Secondary | ICD-10-CM

## 2014-06-06 DIAGNOSIS — D6481 Anemia due to antineoplastic chemotherapy: Secondary | ICD-10-CM

## 2014-06-06 DIAGNOSIS — M79671 Pain in right foot: Secondary | ICD-10-CM

## 2014-06-06 DIAGNOSIS — D630 Anemia in neoplastic disease: Secondary | ICD-10-CM

## 2014-06-06 DIAGNOSIS — R29898 Other symptoms and signs involving the musculoskeletal system: Secondary | ICD-10-CM

## 2014-06-06 LAB — COMPREHENSIVE METABOLIC PANEL (CC13)
ALBUMIN: 3.7 g/dL (ref 3.5–5.0)
ALT: 15 U/L (ref 0–55)
AST: 18 U/L (ref 5–34)
Alkaline Phosphatase: 94 U/L (ref 40–150)
Anion Gap: 8 mEq/L (ref 3–11)
BUN: 15.2 mg/dL (ref 7.0–26.0)
CHLORIDE: 104 meq/L (ref 98–109)
CO2: 30 mEq/L — ABNORMAL HIGH (ref 22–29)
CREATININE: 0.8 mg/dL (ref 0.6–1.1)
Calcium: 9.1 mg/dL (ref 8.4–10.4)
EGFR: 83 mL/min/{1.73_m2} — ABNORMAL LOW (ref >90)
Glucose: 97 mg/dl (ref 70–140)
Potassium: 4 mEq/L (ref 3.5–5.1)
SODIUM: 142 meq/L (ref 136–145)
Total Bilirubin: 0.46 mg/dL (ref 0.20–1.20)
Total Protein: 6.4 g/dL (ref 6.4–8.3)

## 2014-06-06 LAB — CBC WITH DIFFERENTIAL/PLATELET
BASO%: 0.7 % (ref 0.0–2.0)
Basophils Absolute: 0 10*3/uL (ref 0.0–0.1)
EOS%: 4.3 % (ref 0.0–7.0)
Eosinophils Absolute: 0.2 10*3/uL (ref 0.0–0.5)
HEMATOCRIT: 35.4 % (ref 34.8–46.6)
HGB: 11.5 g/dL — ABNORMAL LOW (ref 11.6–15.9)
LYMPH%: 27.3 % (ref 14.0–49.7)
MCH: 29.2 pg (ref 25.1–34.0)
MCHC: 32.5 g/dL (ref 31.5–36.0)
MCV: 89.7 fL (ref 79.5–101.0)
MONO#: 0.4 10*3/uL (ref 0.1–0.9)
MONO%: 9.7 % (ref 0.0–14.0)
NEUT%: 58 % (ref 38.4–76.8)
NEUTROS ABS: 2.5 10*3/uL (ref 1.5–6.5)
PLATELETS: 183 10*3/uL (ref 145–400)
RBC: 3.94 10*6/uL (ref 3.70–5.45)
RDW: 13.9 % (ref 11.2–14.5)
WBC: 4.3 10*3/uL (ref 3.9–10.3)
lymph#: 1.2 10*3/uL (ref 0.9–3.3)

## 2014-06-06 MED ORDER — DIPHENHYDRAMINE HCL 25 MG PO CAPS
25.0000 mg | ORAL_CAPSULE | Freq: Once | ORAL | Status: AC
  Administered 2014-06-06: 25 mg via ORAL

## 2014-06-06 MED ORDER — DIPHENHYDRAMINE HCL 25 MG PO CAPS
ORAL_CAPSULE | ORAL | Status: AC
  Filled 2014-06-06: qty 1

## 2014-06-06 MED ORDER — HEPARIN SOD (PORK) LOCK FLUSH 100 UNIT/ML IV SOLN
500.0000 [IU] | Freq: Once | INTRAVENOUS | Status: AC
  Administered 2014-06-06: 500 [IU] via INTRAVENOUS
  Filled 2014-06-06: qty 5

## 2014-06-06 MED ORDER — TRASTUZUMAB CHEMO INJECTION 440 MG
6.0000 mg/kg | Freq: Once | INTRAVENOUS | Status: AC
  Administered 2014-06-06: 567 mg via INTRAVENOUS
  Filled 2014-06-06: qty 27

## 2014-06-06 MED ORDER — SODIUM CHLORIDE 0.9 % IV SOLN
Freq: Once | INTRAVENOUS | Status: AC
  Administered 2014-06-06: 11:00:00 via INTRAVENOUS

## 2014-06-06 MED ORDER — ESTRADIOL 10 MCG VA TABS: 1.0000 | ORAL_TABLET | VAGINAL | Status: DC

## 2014-06-06 MED ORDER — SODIUM CHLORIDE 0.9 % IJ SOLN
10.0000 mL | INTRAMUSCULAR | Status: DC | PRN
  Administered 2014-06-06: 10 mL via INTRAVENOUS
  Filled 2014-06-06: qty 10

## 2014-06-06 MED ORDER — ACETAMINOPHEN 325 MG PO TABS
ORAL_TABLET | ORAL | Status: AC
  Filled 2014-06-06: qty 2

## 2014-06-06 MED ORDER — ACETAMINOPHEN 325 MG PO TABS
650.0000 mg | ORAL_TABLET | Freq: Once | ORAL | Status: AC
  Administered 2014-06-06: 650 mg via ORAL

## 2014-06-06 MED ORDER — ESTRADIOL 2 MG VA RING: 2.0000 mg | VAGINAL_RING | VAGINAL | Status: DC

## 2014-06-06 NOTE — Progress Notes (Signed)
Put [facility] pa form on nurse's desk

## 2014-06-06 NOTE — Therapy (Signed)
[place], [place], [address] Phone: [phone]   Fax:  [phone]  Physical Therapy Treatment  Patient Details  Name: Lori Vincent MRN: [id number] Date of Birth: [birth year] Referring Provider:  [name], NP  Encounter Date: 06/06/2014      PT End of Session - 06/06/14 0828    Visit Number 11   Number of Visits 21   Date for PT Re-Evaluation 07/14/14   PT Start Time 0806   PT Stop Time 0840   PT Time Calculation (min) 34 min      Past Medical History  Diagnosis Date  . Breast cancer 06/03/13    left, 6:00 o'clock  . Depression   . Hypothyroidism   . Hypokalemia   . Anemia due to chemotherapy   . History of radiation therapy 01/18/14- 03/02/14    left breast 4680 cGy 26 sessions, boost of 1000 cGy 5 sessions    Past Surgical History  Procedure Laterality Date  . Tonsillectomy  1972  . Cesarean section  04/1999  . Breast biopsy  1/14    lt  . Dilation and curettage of uterus    . Colonoscopy  2013  . Breast lumpectomy with needle localization and axillary sentinel lymph node bx Left 07/26/2013    Procedure: BREAST LUMPECTOMY WITH NEEDLE LOCALIZATION AND AXILLARY SENTINEL LYMPH NODE BX;  Surgeon: Shann Medal, MD;  Location: [facility];  Service: General;  Laterality: Left;  Marland Kitchen Mole removal Right 07/26/2013    Procedure: MOLE REMOVAL UNDER RIGHT BREAST;  Surgeon: Shann Medal, MD;  Location: [facility];  Service: General;  Laterality: Right;  . Portacath placement Right 07/26/2013    Procedure: INSERTION PORT-A-CATH;  Surgeon: Shann Medal, MD;  Location: [facility];  Service: General;  Laterality: Right;    There were no vitals taken for this visit.  Visit Diagnosis:  Pain in both feet  Weakness of both legs  Lymphedema syndrome, postmastectomy      Subjective Assessment - 06/06/14 0810    Symptoms Feel like every week my feet  get a little better, just the tops of my feet is where the pain is still the worse. Have an appt with Dr. Jana Hakim at 9, want to leave by 0840. And my Lt breast in the last few weeks has been getting redder, fuller, and more tender.   Currently in Pain? Yes   Pain Score 5    Pain Location Foot   Pain Orientation Right;Left   Pain Descriptors / Indicators Sharp   Pain Type Chronic pain   Pain Radiating Towards toes   Aggravating Factors  Its only hurts right before toe off.                    [facility] Adult PT Treatment/Exercise - 06/06/14 0001    Knee/Hip Exercises: Aerobic   Stationary Bike Level 1 x 7 minutes.   Knee/Hip Exercises: Standing   Rocker Board 2 minutes  Front-back   Manual Therapy   Massage Soft-deep tissue work to [name]                        Long Term Clinic Goals - 06/02/14 1317    CC Long Term Goal  #1   Title Pt. will report at least 40% decrease in foot pain.   Time 6   Period Weeks   Status  On-going   CC Long Term Goal  #2   Title Pt. will be independent in self-pain management for foot pain.   Time 6   Period Weeks   Status On-going   CC Long Term Goal  #3   Title Pt. will be independent in home exercise program for LE strengthing.   Time 6   Period Weeks   Status On-going   CC Long Term Goal  #4   Title Pt. will report at least 25% greater ease in coming to standing from sitting position.   Time 6   Period Weeks   Status On-going   CC Long Term Goal  #5   Title pt will report a 50% reduction in left lateral chest pain.   Time 6   Period Weeks   Status New            Plan - 06/06/14 0843    Clinical Impression Statement Assessed pts Lt breast where shes been c/o tenderness, soreness, and redness; pt very tender to touch and increased swelling noted when comparing Rt and Lt breast, and mild redness. Advised pt to show Dr. Jana Hakim today and she agreed. Limited treatment time today as pt arrived late and had to  leave early, though did notice less tenderness with trigger points at bil arches with massage today.    Pt will benefit from skilled therapeutic intervention in order to improve on the following deficits Pain;Decreased strength;Decreased mobility   Rehab Potential Good   PT Frequency 2x / week   PT Duration 4 weeks   PT Treatment/Interventions Electrical Stimulation;Manual techniques;Therapeutic exercise;Patient/family education   PT Next Visit Plan Continue with exercise for LEs. Add supine theraband to plantarflexors.Continue with estim or TNS trial    PT Home Exercise Plan Bil LE stretching        Problem List Patient Active Problem List   Diagnosis Date Noted  . Antineoplastic chemotherapy induced anemia(285.3) 01/07/2014  . Low magnesium levels 01/06/2014  . Hemorrhoid 11/15/2013  . Hypokalemia 11/04/2013  . Pedal edema 10/14/2013  . Diarrhea 09/23/2013  . GERD (gastroesophageal reflux disease) 08/23/2013  . Anxiety 08/23/2013  . Breast cancer of lower-outer quadrant of left female breast 06/19/2013    Otelia Limes, PTA 06/06/2014, 10:04 AM  [place], [place], [address] Phone: [phone]   Fax:  [phone]

## 2014-06-06 NOTE — Progress Notes (Signed)
[place]  [phone] [phone]     ID: [name] OB: Jun 21, 1955  MR#: [id number]  BRA#:[id number]  PCP: [name], NP GYN:  [name], MD SU: [name], MD OTHER MD: Arloa Koh, MD  CHIEF COMPLAINT: estrogen receptor positive breast cancer CURRENT TREATMENT: anti-HER-2 therapy  HISTORY OF PRESENT ILLNESS: From the original intake note:  Verla [facility] showing a possible mass in the left breast. Diagnostic left mammography and ultrasonography at the breast center 04/26/2013 confirmed an oval mass with microcalcifications in the 6:00 position of the left breast. There was an adjacent partially obscured 1.2 cm mass. On physical exam there was no palpable mass in the left breast. By ultrasonography, there was an oval mass at the 6:00 position measuring 1.4 cm, with a second mass measuring 1.1 cm. There were no abnormal lymph nodes noted in the left axilla.  Biopsy of the 6:00 mass in the left breast January 2015 showed ([id number]) and invasive ductal carcinoma, grade 3, estrogen receptor 97% positive, progesterone receptor 75% positive, with strong staining intensity, and MIB-144%. HER-2 was amplified, with the signals ratio 4.18 and in number per cell of 9.40 .  On 06/08/2013 the patient underwent bilateral breast MRI which showed a dumbbell-shaped area in the central left breast consisting of a 2 cm mass and 2 extensions or an adjacent masses, measuring a total of 2.9 cm. There were no abnormal appearing lymph nodes for any other areas of concern.  The patient's subsequent history is as detailed below.   INTERVAL HISTORY: Julyssa returns today for follow up of her breast cancer. Since her last visit here she started tamoxifen (04/26/2014). She has not noticed any increase in hot flashes. She has not had any vaginal discharge. She has not noted any other symptoms. She  continues to tolerate her trastuzumab without any side effects that she is aware of  REVIEW OF SYSTEMS: Toni tells me her nighttime hot flashes are considerably improved on gabapentin. She is having significant vaginal dryness issues and dyspareunia. She did come to our "intimacy and pelvic health" program and got quite a bit out of it. She is having some accommodation issues manifested as visual blurring. She's also very tender in her left breast. It is either brown or pink, she can decide what to call the color, but she is worried about it. She also has a "bump" in the posterior aspect of her right thigh. Her thigh muscles are also weak and she is having trouble rising from a chair without pushing off. She has some joint pains here and there, which are not more intense or persistent than before. A detailed review of systems today was otherwise stable   PAST MEDICAL HISTORY: Past Medical History  Diagnosis Date  . Breast cancer 06/03/13    left, 6:00 o'clock  . Depression   . Hypothyroidism   . Hypokalemia   . Anemia due to chemotherapy   . History of radiation therapy 01/18/14- 03/02/14    left breast 4680 cGy 26 sessions, boost of 1000 cGy 5 sessions    PAST SURGICAL HISTORY: Past Surgical History  Procedure Laterality Date  . Tonsillectomy  1972  . Cesarean section  04/1999  . Breast biopsy  1/14    lt  . Dilation and curettage of uterus    . Colonoscopy  2013  . Breast lumpectomy with needle localization and axillary sentinel lymph node bx Left 07/26/2013  Procedure: BREAST LUMPECTOMY WITH NEEDLE LOCALIZATION AND AXILLARY SENTINEL LYMPH NODE BX;  Surgeon: Shann Medal, MD;  Location: [facility];  Service: General;  Laterality: Left;  Marland Kitchen Mole removal Right 07/26/2013    Procedure: MOLE REMOVAL UNDER RIGHT BREAST;  Surgeon: Shann Medal, MD;  Location: [facility];  Service: General;  Laterality: Right;  . Portacath placement Right 07/26/2013     Procedure: INSERTION PORT-A-CATH;  Surgeon: Shann Medal, MD;  Location: [facility];  Service: General;  Laterality: Right;    FAMILY HISTORY Family History  Problem Relation Age of Onset  . Stroke Mother   . Diabetes Father   . Cancer Maternal Grandmother 68    colon   the patient's father died at the age of 72 in a car accident. The patient's mother died at the age of 25 from a stroke. The patient has one adopted brother. She has one biological sister. The patient's mother's mother was diagnosed with colon cancer at the age of 49. There is no history of breast oropharynx cancer in the family to the patient's knowledge.  She asked about her daughter needing testing for brca and i said no.  GYNECOLOGIC HISTORY:   (Reviewed 11/15/2013) Menarche age 17. First live birth age 70. The patient is GX P1. She took birth control pills for approximately 12 years remotely. She had a period in February of 2014 and then in December of 2014. She had been started on hormone replacement in December. That has since been discontinued.  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 Her husband died at [place] 3 years ago with soft tissue sarcoma. The patient's significant other also works as a Geophysicist/field seismologist. At home it is just the patient and her daughter 44, 57 years old, who just finished middle school. The patient has 2 stepchildren from her earlier marriage. [name] is a Games developer in [place] and [place] (masc.) studies criminal Justice.   ADVANCED DIRECTIVES: Not in place   HEALTH MAINTENANCE:  (Updated 11/15/2013) History  Substance Use Topics  . Smoking status: Never Smoker   . Smokeless tobacco: Never Used  . Alcohol Use: Yes     Comment: 5-7 glasses per week of red wine     Colonoscopy: July 2014  PAP: November 2014  Bone density: November  2014  Lipid panel: Not on file  Allergies  Allergen Reactions  . Compazine [Prochlorperazine] Other (See Comments)    Extreme restlessness and mild extrapyramidal movement;  Patient tolerates Reglan with no problems    Current Outpatient Prescriptions  Medication Sig Dispense Refill  . citalopram (CELEXA) 20 MG tablet Take 1 tablet (20 mg total) by mouth daily. 30 tablet 1  . gabapentin (NEURONTIN) 100 MG capsule Take 1 capsule (100 mg total) by mouth 3 (three) times daily. 90 capsule 4  . gabapentin (NEURONTIN) 300 MG capsule Take 1 capsule (300 mg total) by mouth 3 (three) times daily. 30 capsule 2  . levothyroxine (SYNTHROID, LEVOTHROID) 112 MCG tablet Take 112 mcg by mouth daily before breakfast. Take sat and sunday    . lidocaine-prilocaine (EMLA) cream Apply 1 application topically as needed. 1-2 hr before each port access 30 g 5  . LORazepam (ATIVAN) 0.5 MG tablet Take 1 tablet (0.5 mg total) by mouth 2 (two) times daily as needed for anxiety. 60 tablet  0  . sulfamethoxazole-trimethoprim (BACTRIM DS,SEPTRA DS) 800-160 MG per tablet Take 1 tablet by mouth 2 (two) times daily. (Patient not taking: Reported on 04/26/2014) 10 tablet 0  . tamoxifen (NOLVADEX) 20 MG tablet Take 1 tablet (20 mg total) by mouth daily. 90 tablet 4  . Wound Dressings (RADIAGEL) GEL Apply topically 2 (two) times daily.     No current facility-administered medications for this visit.    OBJECTIVE: Middle-aged white woman who appears stated age 59 Vitals:   06/06/14 1001  BP: 141/60  Pulse: 70  Temp: 98.7 F (37.1 C)  Resp: 18   Body mass index is 32.21 kg/(m^2).    ECOG FS: 1 Filed Weights   06/06/14 1001  Weight: 211 lb 12.8 oz (96.072 kg)   Sclerae unicteric, pupils round and equal Oropharynx clear, dentition in good repair No cervical or supraclavicular adenopathy Lungs no rales or rhonchi Heart regular rate and rhythm Abd soft, nontender, positive bowel sounds MSK no focal spinal  tenderness, no upper extremity lymphedema Neuro: nonfocal, well oriented, positive affect Breasts: the right breast is unremarkable. The left breast is status post lumpectomy and radiation. There is tenderness over the breast particularly in the superior aspect, but no palpable mass. There is some duskiness but no frank erythema. What I'm seeing is really consistent with post radiation change. The left axilla is benign   LAB RESULTS: CBC    Component Value Date/Time   WBC 4.3 06/06/2014 0929   RBC 3.94 06/06/2014 0929   HGB 11.5* 06/06/2014 0929   HCT 35.4 06/06/2014 0929   PLT 183 06/06/2014 0929   MCV 89.7 06/06/2014 0929   MCH 29.2 06/06/2014 0929   MCHC 32.5 06/06/2014 0929   RDW 13.9 06/06/2014 0929   LYMPHSABS 1.2 06/06/2014 0929   MONOABS 0.4 06/06/2014 0929   EOSABS 0.2 06/06/2014 0929   BASOSABS 0.0 06/06/2014 0929    CMP     Component Value Date/Time   NA 142 06/06/2014 0930   K 4.0 06/06/2014 0930   CO2 30* 06/06/2014 0930   GLUCOSE 97 06/06/2014 0930   BUN 15.2 06/06/2014 0930   CREATININE 0.8 06/06/2014 0930   CALCIUM 9.1 06/06/2014 0930   PROT 6.4 06/06/2014 0930   ALBUMIN 3.7 06/06/2014 0930   AST 18 06/06/2014 0930   ALT 15 06/06/2014 0930   ALKPHOS 94 06/06/2014 0930   BILITOT 0.46 06/06/2014 0930    STUDIES: Most recent echocardiogram on 12/28/13 showed an ejection fraction of 60-65%  ASSESSMENT: 59 y.o. [place] woman, status post left breast biopsy 06/03/2013 for a clinical T2 N0, stage IIA invasive ductal carcinoma, grade 3, estrogen and progesterone receptor positive, with an MIB-1 of 44%, and HER-2 amplified.  (1) status post left lumpectomy and sentinel lymph node sampling 07/26/2013 for a pT2 pN0, stage IIA invasive ductal carcinoma, grade 3, with negative margins.  (2) treated in the adjuvant setting with docetaxel/carboplatin (AUC 5) given along with trastuzumab/pertuzumab x6 cycles, first dose on 09/02/2013, last dose 12/16/2013  (3)  trastuzumab to continue through March 2016; most recent echo 12/28/2013 showed an excellent ejection fraction  (4) adjuvant radiation completed 03/02/2014  (5) tamoxifen started 04/26/2014  (6)  Depression/anxiety - on Celexa, 20 mg  (7) chemotherapy-induced anemia   (8) vaginal dryness/dyspareunia   PLAN: Naevia is tolerating tamoxifen remarkably well. The plan is going to be to continue that for a minimum of 2 years and a maximum of 10.  While on tamoxifen it is quite a right  for her to use vaginal estrogens. I am putting in a prescription for Estring and also for Vagifem suppositories so she can explore cost issues. She will discuss this with Dr. Cordelia Pen wall as well.  I think what were seeing in her breast is simply the post radiation duskiness. There is some inflammation in the breast but I do not palpate a mass or any evidence of seroma. In general pain is not a symptom of breast cellulitis and it really does not look infected I have asked her to let me know if it becomes more red or if the duskiness appears to be spreading.  She is behind on her echocardiograms. I have written for 1 next week.  Otherwise her final pertuzumab treatment will be March 14. I hope to see her around that date. From that point we will start seeing her on an every three-month basis  Keylah has a good understanding of the overall plan. She agrees with it. She knows the goal of treatment in her case is cure. She will call with any problems that may develop before her next visit here.  Overall Ebony Hail is doing very well. She is already scheduled for a repeat echo later this month.She knows to call for any problems that may develop before her next visit here.  Chauncey Cruel, MD   06/06/2014 10:24 AM

## 2014-06-06 NOTE — Patient Instructions (Signed)
[facility] Discharge Instructions for Patients Receiving Chemotherapy  Today you received the following chemotherapy agents herceptin  To help prevent nausea and vomiting after your treatment, we encourage you to take your nausea medication if needed.   If you develop nausea and vomiting that is not controlled by your nausea medication, call the clinic.   BELOW ARE SYMPTOMS THAT SHOULD BE REPORTED IMMEDIATELY:  *FEVER GREATER THAN 100.5 F  *CHILLS WITH OR WITHOUT FEVER  NAUSEA AND VOMITING THAT IS NOT CONTROLLED WITH YOUR NAUSEA MEDICATION  *UNUSUAL SHORTNESS OF BREATH  *UNUSUAL BRUISING OR BLEEDING  TENDERNESS IN MOUTH AND THROAT WITH OR WITHOUT PRESENCE OF ULCERS  *URINARY PROBLEMS  *BOWEL PROBLEMS  UNUSUAL RASH Items with * indicate a potential emergency and should be followed up as soon as possible.  Feel free to call the clinic you have any questions or concerns. The clinic phone number is [phone].

## 2014-06-06 NOTE — Telephone Encounter (Signed)
, °

## 2014-06-06 NOTE — Progress Notes (Signed)
RECEIVED A FAX FROM [facility] CONCERNING A PRIOR AUTHORIZATION FOR ESTRING VAGINAL RING. THIS REQUEST WAS PLACED IN THE MANAGED CARE BIN.

## 2014-06-07 NOTE — Addendum Note (Signed)
Addended by: Jaci Carrel A on: 06/07/2014 11:04 AM   Modules accepted: Medications

## 2014-06-08 ENCOUNTER — Ambulatory Visit: Payer: BLUE CROSS/BLUE SHIELD

## 2014-06-08 DIAGNOSIS — M79671 Pain in right foot: Secondary | ICD-10-CM

## 2014-06-08 DIAGNOSIS — R29898 Other symptoms and signs involving the musculoskeletal system: Secondary | ICD-10-CM

## 2014-06-08 DIAGNOSIS — M79672 Pain in left foot: Principal | ICD-10-CM

## 2014-06-08 DIAGNOSIS — Z5189 Encounter for other specified aftercare: Secondary | ICD-10-CM | POA: Diagnosis not present

## 2014-06-08 DIAGNOSIS — I972 Postmastectomy lymphedema syndrome: Secondary | ICD-10-CM

## 2014-06-08 NOTE — Therapy (Signed)
[place], [place], [address] Phone: [phone]   Fax:  [phone]  Physical Therapy Treatment  Patient Details  Name: Lori Vincent MRN: [id number] Date of Birth: Nov 18, 1955 Referring Provider:  [name], NP  Encounter Date: 06/08/2014      PT End of Session - 06/08/14 1041    Visit Number 12   Number of Visits 21   Date for PT Re-Evaluation 07/14/14   PT Start Time 0935   PT Stop Time 2836   PT Time Calculation (min) 66 min      Past Medical History  Diagnosis Date  . Breast cancer 06/03/13    left, 6:00 o'clock  . Depression   . Hypothyroidism   . Hypokalemia   . Anemia due to chemotherapy   . History of radiation therapy 01/18/14- 03/02/14    left breast 4680 cGy 26 sessions, boost of 1000 cGy 5 sessions    Past Surgical History  Procedure Laterality Date  . Tonsillectomy  1972  . Cesarean section  04/1999  . Breast biopsy  1/14    lt  . Dilation and curettage of uterus    . Colonoscopy  2013  . Breast lumpectomy with needle localization and axillary sentinel lymph node bx Left 07/26/2013    Procedure: BREAST LUMPECTOMY WITH NEEDLE LOCALIZATION AND AXILLARY SENTINEL LYMPH NODE BX;  Surgeon: Shann Medal, MD;  Location: [facility];  Service: General;  Laterality: Left;  Marland Kitchen Mole removal Right 07/26/2013    Procedure: MOLE REMOVAL UNDER RIGHT BREAST;  Surgeon: Shann Medal, MD;  Location: [facility];  Service: General;  Laterality: Right;  . Portacath placement Right 07/26/2013    Procedure: INSERTION PORT-A-CATH;  Surgeon: Shann Medal, MD;  Location: [facility];  Service: General;  Laterality: Right;    There were no vitals taken for this visit.  Visit Diagnosis:  Pain in both feet  Weakness of both legs  Lymphedema syndrome, postmastectomy      Subjective Assessment - 06/08/14 0939    Symptoms Going to go get new bras soon.  Saw Dr. Jana Hakim and he said the redness and swelling is a normal side effect from radiation and he doesnt think its anything else except maybe just a little inflammation, but nothing to worry about. High bone on top of my feet is still where I have the most pain.   Currently in Pain? Yes   Pain Score 4    Pain Location Foot   Pain Orientation Right;Left   Pain Descriptors / Indicators Sharp   Multiple Pain Sites Yes                    [facility] Adult PT Treatment/Exercise - 06/08/14 0001    Knee/Hip Exercises: Aerobic   Stationary Bike Level 2 x8 minutes   Knee/Hip Exercises: Machines for Strengthening   Cybex Leg Press 60# 2 sets of 10 reps   Knee/Hip Exercises: Standing   Walking with Sports Cord 4 ways with SBA 3-4 reps in each direction   Electrical Stimulation   Electrical Stimulation Action TENS unit issued   Manual Therapy   Massage Soft-deep tissue work to [name]                PT Education - 06/08/14 1029    Education provided Yes   Education Details TENS unit use and precautions   Person(s) Educated Patient  Methods Explanation;Demonstration   Comprehension Verbalized understanding;Returned demonstration                Long Term Clinic Goals - 06/08/14 1052    CC Long Term Goal  #1   Title Pt. will report at least 40% decrease in foot pain.   Status On-going   CC Long Term Goal  #2   Title Pt. will be independent in self-pain management for foot pain.  Issued TENS unit today   Status Partially Met   CC Long Term Goal  #3   Title Pt. will be independent in home exercise program for LE strengthing.   Status On-going   CC Long Term Goal  #4   Title Pt. will report at least 25% greater ease in coming to standing from sitting position.   Status On-going   CC Long Term Goal  #5   Title pt will report a 50% reduction in left lateral chest pain.   Status On-going            Plan - 06/08/14 1050    Clinical Impression Statement  Issued TENS unit to pt today and she demonstraed good understanding of use of unit and precautions of use. Also increased proprioceptive activities and pt tolerated well but did have increased foot pain after.    Pt will benefit from skilled therapeutic intervention in order to improve on the following deficits Pain;Decreased strength;Decreased mobility   Rehab Potential Good   PT Frequency 2x / week   PT Duration 4 weeks   PT Treatment/Interventions Electrical Stimulation;Manual techniques;Therapeutic exercise;Patient/family education   PT Next Visit Plan Continue with exercise for LEs. Add supine theraband to plantarflexors.   PT Home Exercise Plan Bil LE stretching   Consulted and Agree with Plan of Care Patient        Problem List Patient Active Problem List   Diagnosis Date Noted  . Anemia in neoplastic disease 01/07/2014  . Low magnesium levels 01/06/2014  . Hemorrhoid 11/15/2013  . Hypokalemia 11/04/2013  . Pedal edema 10/14/2013  . Diarrhea 09/23/2013  . GERD (gastroesophageal reflux disease) 08/23/2013  . Anxiety 08/23/2013  . Breast cancer of lower-outer quadrant of left female breast 06/19/2013    Lori Vincent, Lori Vincent, PTA 06/08/2014, 10:56 AM  [facility] [address] [place], [place], [address] Phone: [phone]   Fax:  [phone]      

## 2014-06-13 ENCOUNTER — Telehealth: Payer: Self-pay | Admitting: Oncology

## 2014-06-13 NOTE — Telephone Encounter (Signed)
, °

## 2014-06-14 ENCOUNTER — Ambulatory Visit: Payer: BLUE CROSS/BLUE SHIELD | Admitting: Physical Therapy

## 2014-06-14 ENCOUNTER — Telehealth: Payer: Self-pay

## 2014-06-14 NOTE — Telephone Encounter (Signed)
Called pt left message regarding appt for today [facility]

## 2014-06-15 ENCOUNTER — Ambulatory Visit (HOSPITAL_COMMUNITY): Payer: BLUE CROSS/BLUE SHIELD

## 2014-06-17 ENCOUNTER — Ambulatory Visit: Payer: BLUE CROSS/BLUE SHIELD | Admitting: Physical Therapy

## 2014-06-20 ENCOUNTER — Ambulatory Visit (HOSPITAL_COMMUNITY)
Admission: RE | Admit: 2014-06-20 | Discharge: 2014-06-20 | Disposition: A | Discharge status: Home / Self Care | Payer: BLUE CROSS/BLUE SHIELD | Source: Ambulatory Visit | Attending: Oncology | Admitting: Oncology

## 2014-06-20 DIAGNOSIS — C50512 Malignant neoplasm of lower-outer quadrant of left female breast: Secondary | ICD-10-CM

## 2014-06-20 DIAGNOSIS — C50919 Malignant neoplasm of unspecified site of unspecified female breast: Secondary | ICD-10-CM | POA: Insufficient documentation

## 2014-06-20 NOTE — Progress Notes (Signed)
  Echocardiogram 2D Echocardiogram has been performed.  Lori Vincent Lori Vincent 06/20/2014, 11:10 AM

## 2014-06-22 ENCOUNTER — Ambulatory Visit: Payer: BLUE CROSS/BLUE SHIELD

## 2014-06-22 ENCOUNTER — Other Ambulatory Visit: Payer: Self-pay | Admitting: Obstetrics and Gynecology

## 2014-06-22 DIAGNOSIS — R29898 Other symptoms and signs involving the musculoskeletal system: Secondary | ICD-10-CM

## 2014-06-22 DIAGNOSIS — M79671 Pain in right foot: Secondary | ICD-10-CM

## 2014-06-22 DIAGNOSIS — M79672 Pain in left foot: Principal | ICD-10-CM

## 2014-06-22 DIAGNOSIS — Z5189 Encounter for other specified aftercare: Secondary | ICD-10-CM | POA: Diagnosis not present

## 2014-06-22 DIAGNOSIS — I972 Postmastectomy lymphedema syndrome: Secondary | ICD-10-CM

## 2014-06-22 NOTE — Therapy (Signed)
[facility] [place], [place], [address] Phone: [phone]   Fax:  [phone]  Physical Therapy Treatment  Patient Details  Name: Lori Vincent MRN: [id number] Date of Birth: February 28, 1956 Referring Provider:  [name], NP  Encounter Date: 06/22/2014      PT End of Session - 06/22/14 0958    Visit Number 13   Number of Visits 21   Date for PT Re-Evaluation 07/14/14   PT Start Time 0852   PT Stop Time 0949   PT Time Calculation (min) 57 min      Past Medical History  Diagnosis Date  . Breast cancer 06/03/13    left, 6:00 o'clock  . Depression   . Hypothyroidism   . Hypokalemia   . Anemia due to chemotherapy   . History of radiation therapy 01/18/14- 03/02/14    left breast 4680 cGy 26 sessions, boost of 1000 cGy 5 sessions    Past Surgical History  Procedure Laterality Date  . Tonsillectomy  1972  . Cesarean section  04/1999  . Breast biopsy  1/14    lt  . Dilation and curettage of uterus    . Colonoscopy  2013  . Breast lumpectomy with needle localization and axillary sentinel lymph node bx Left 07/26/2013    Procedure: BREAST LUMPECTOMY WITH NEEDLE LOCALIZATION AND AXILLARY SENTINEL LYMPH NODE BX;  Surgeon: Shann Medal, MD;  Location: [facility];  Service: General;  Laterality: Left;  Marland Kitchen Mole removal Right 07/26/2013    Procedure: MOLE REMOVAL UNDER RIGHT BREAST;  Surgeon: Shann Medal, MD;  Location: [facility];  Service: General;  Laterality: Right;  . Portacath placement Right 07/26/2013    Procedure: INSERTION PORT-A-CATH;  Surgeon: Shann Medal, MD;  Location: [facility];  Service: General;  Laterality: Right;    There were no vitals taken for this visit.  Visit Diagnosis:  Pain in both feet  Weakness of both legs  Lymphedema syndrome, postmastectomy      Subjective Assessment - 06/22/14 0857    Symptoms Am back to work more now and  my bil foot pain has flared up some in past few days due to walking alot for work. My biggest complaint now is my bil thigh weakness.                    [facility] Adult PT Treatment/Exercise - 06/22/14 0001    Knee/Hip Exercises: Aerobic   Isokinetic NuStep Levels 5-7 x10 mins   Knee/Hip Exercises: Machines for Strengthening   Cybex Leg Press 60# 3 sets of 10 reps, then heel raises 60# 2 sets of 10 reps   Knee/Hip Exercises: Standing   Walking with Sports Cord 4 ways: Backwards 4 plates, Rt sidestep 3 plates, front and Lt sidestep 2 plates all 4 reps each with CGA-SBA   Manual Therapy   Massage Soft-deep tissue work to bil feet   Passive ROM Into dorsiflexion throughout massage for trigger point release to plantar surface.                        Long Term Clinic Goals - 06/22/14 1000    CC Long Term Goal  #1   Title Pt. will report at least 40% decrease in foot pain.   Status On-going   CC Long Term Goal  #2   Title Pt. will be independent in self-pain management for foot  pain.   Status Partially Met   CC Long Term Goal  #3   Title Pt. will be independent in home exercise program for LE strengthing.   Status On-going   CC Long Term Goal  #4   Title Pt. will report at least 25% greater ease in coming to standing from sitting position.   Status On-going   CC Long Term Goal  #5   Title pt will report a 50% reduction in left lateral chest pain.   Status On-going            Plan - 06/22/14 2820    Clinical Impression Statement Pt using TENS at home and has been compliant with HEP though reports is having trouble with decreasing foot tightness in past few days with increased walking.    Pt will benefit from skilled therapeutic intervention in order to improve on the following deficits Pain;Decreased strength;Decreased mobility   Rehab Potential Good   PT Frequency 2x / week   PT Duration 4 weeks   PT Treatment/Interventions Electrical Stimulation;Manual  techniques;Therapeutic exercise;Patient/family education   PT Next Visit Plan Continue with exercise for LEs. Add supine theraband to plantarflexors.   PT Home Exercise Plan Bil LE stretching   Consulted and Agree with Plan of Care Patient        Problem List Patient Active Problem List   Diagnosis Date Noted  . Anemia in neoplastic disease 01/07/2014  . Low magnesium levels 01/06/2014  . Hemorrhoid 11/15/2013  . Hypokalemia 11/04/2013  . Pedal edema 10/14/2013  . Diarrhea 09/23/2013  . GERD (gastroesophageal reflux disease) 08/23/2013  . Anxiety 08/23/2013  . Breast cancer of lower-outer quadrant of left female breast 06/19/2013    Otelia Limes, PTA 06/22/2014, 10:02 AM  [facility] [place], [place], [address] Phone: [phone]   Fax:  [phone]

## 2014-06-24 ENCOUNTER — Other Ambulatory Visit: Payer: Self-pay | Admitting: *Deleted

## 2014-06-24 DIAGNOSIS — C50512 Malignant neoplasm of lower-outer quadrant of left female breast: Secondary | ICD-10-CM

## 2014-06-24 MED ORDER — LORAZEPAM 0.5 MG PO TABS: 0.5000 mg | ORAL_TABLET | Freq: Two times a day (BID) | ORAL | Status: DC | PRN

## 2014-06-27 ENCOUNTER — Other Ambulatory Visit (HOSPITAL_BASED_OUTPATIENT_CLINIC_OR_DEPARTMENT_OTHER): Payer: BLUE CROSS/BLUE SHIELD

## 2014-06-27 ENCOUNTER — Ambulatory Visit (HOSPITAL_BASED_OUTPATIENT_CLINIC_OR_DEPARTMENT_OTHER): Payer: BLUE CROSS/BLUE SHIELD

## 2014-06-27 ENCOUNTER — Ambulatory Visit: Payer: BLUE CROSS/BLUE SHIELD | Admitting: Physical Therapy

## 2014-06-27 DIAGNOSIS — C50812 Malignant neoplasm of overlapping sites of left female breast: Secondary | ICD-10-CM

## 2014-06-27 DIAGNOSIS — C50512 Malignant neoplasm of lower-outer quadrant of left female breast: Secondary | ICD-10-CM

## 2014-06-27 DIAGNOSIS — Z5112 Encounter for antineoplastic immunotherapy: Secondary | ICD-10-CM

## 2014-06-27 LAB — COMPREHENSIVE METABOLIC PANEL (CC13)
ALT: 18 U/L (ref 0–55)
AST: 16 U/L (ref 5–34)
Albumin: 3.5 g/dL (ref 3.5–5.0)
Alkaline Phosphatase: 83 U/L (ref 40–150)
Anion Gap: 12 mEq/L — ABNORMAL HIGH (ref 3–11)
BUN: 14.3 mg/dL (ref 7.0–26.0)
CALCIUM: 8.5 mg/dL (ref 8.4–10.4)
CO2: 21 mEq/L — ABNORMAL LOW (ref 22–29)
CREATININE: 0.8 mg/dL (ref 0.6–1.1)
Chloride: 109 mEq/L (ref 98–109)
EGFR: 83 mL/min/{1.73_m2} — ABNORMAL LOW (ref >90)
GLUCOSE: 111 mg/dL (ref 70–140)
Potassium: 3.6 mEq/L (ref 3.5–5.1)
Sodium: 142 mEq/L (ref 136–145)
Total Bilirubin: 0.44 mg/dL (ref 0.20–1.20)
Total Protein: 6 g/dL — ABNORMAL LOW (ref 6.4–8.3)

## 2014-06-27 LAB — CBC WITH DIFFERENTIAL/PLATELET
BASO%: 0.7 % (ref 0.0–2.0)
BASOS ABS: 0 10*3/uL (ref 0.0–0.1)
EOS ABS: 0.2 10*3/uL (ref 0.0–0.5)
EOS%: 4.8 % (ref 0.0–7.0)
HCT: 34.4 % — ABNORMAL LOW (ref 34.8–46.6)
HGB: 10.9 g/dL — ABNORMAL LOW (ref 11.6–15.9)
LYMPH#: 1.1 10*3/uL (ref 0.9–3.3)
LYMPH%: 30 % (ref 14.0–49.7)
MCH: 28.7 pg (ref 25.1–34.0)
MCHC: 31.7 g/dL (ref 31.5–36.0)
MCV: 90.5 fL (ref 79.5–101.0)
MONO#: 0.4 10*3/uL (ref 0.1–0.9)
MONO%: 9.4 % (ref 0.0–14.0)
NEUT%: 55.1 % (ref 38.4–76.8)
NEUTROS ABS: 2.1 10*3/uL (ref 1.5–6.5)
Platelets: 168 10*3/uL (ref 145–400)
RBC: 3.8 10*6/uL (ref 3.70–5.45)
RDW: 14.5 % (ref 11.2–14.5)
WBC: 3.8 10*3/uL — ABNORMAL LOW (ref 3.9–10.3)

## 2014-06-27 MED ORDER — DIPHENHYDRAMINE HCL 25 MG PO CAPS
25.0000 mg | ORAL_CAPSULE | Freq: Once | ORAL | Status: AC
  Administered 2014-06-27: 25 mg via ORAL

## 2014-06-27 MED ORDER — TRASTUZUMAB CHEMO INJECTION 440 MG
6.0000 mg/kg | Freq: Once | INTRAVENOUS | Status: AC
  Administered 2014-06-27: 567 mg via INTRAVENOUS
  Filled 2014-06-27: qty 27

## 2014-06-27 MED ORDER — HEPARIN SOD (PORK) LOCK FLUSH 100 UNIT/ML IV SOLN
500.0000 [IU] | Freq: Once | INTRAVENOUS | Status: AC | PRN
  Administered 2014-06-27: 500 [IU]
  Filled 2014-06-27: qty 5

## 2014-06-27 MED ORDER — SODIUM CHLORIDE 0.9 % IV SOLN
Freq: Once | INTRAVENOUS | Status: AC
  Administered 2014-06-27: 10:00:00 via INTRAVENOUS

## 2014-06-27 MED ORDER — DIPHENHYDRAMINE HCL 25 MG PO CAPS
ORAL_CAPSULE | ORAL | Status: AC
  Filled 2014-06-27: qty 1

## 2014-06-27 MED ORDER — SODIUM CHLORIDE 0.9 % IJ SOLN
10.0000 mL | INTRAMUSCULAR | Status: DC | PRN
  Administered 2014-06-27: 10 mL
  Filled 2014-06-27: qty 10

## 2014-06-27 MED ORDER — ACETAMINOPHEN 325 MG PO TABS
650.0000 mg | ORAL_TABLET | Freq: Once | ORAL | Status: AC
  Administered 2014-06-27: 650 mg via ORAL

## 2014-06-27 MED ORDER — ACETAMINOPHEN 325 MG PO TABS
ORAL_TABLET | ORAL | Status: AC
  Filled 2014-06-27: qty 2

## 2014-06-27 NOTE — Patient Instructions (Signed)
[facility] Discharge Instructions for Patients Receiving Chemotherapy  Today you received the following antibody agents herceptin  To help prevent nausea and vomiting after your treatment, we encourage you to take your nausea medication if needed  If you develop nausea and vomiting that is not controlled by your nausea medication, call the clinic.   BELOW ARE SYMPTOMS THAT SHOULD BE REPORTED IMMEDIATELY:  *FEVER GREATER THAN 100.5 F  *CHILLS WITH OR WITHOUT FEVER  NAUSEA AND VOMITING THAT IS NOT CONTROLLED WITH YOUR NAUSEA MEDICATION  *UNUSUAL SHORTNESS OF BREATH  *UNUSUAL BRUISING OR BLEEDING  TENDERNESS IN MOUTH AND THROAT WITH OR WITHOUT PRESENCE OF ULCERS  *URINARY PROBLEMS  *BOWEL PROBLEMS  UNUSUAL RASH Items with * indicate a potential emergency and should be followed up as soon as possible.  Feel free to call the clinic you have any questions or concerns. The clinic phone number is [phone].

## 2014-06-28 ENCOUNTER — Ambulatory Visit: Payer: BLUE CROSS/BLUE SHIELD | Attending: Oncology | Admitting: Physical Therapy

## 2014-06-28 DIAGNOSIS — R29898 Other symptoms and signs involving the musculoskeletal system: Secondary | ICD-10-CM | POA: Insufficient documentation

## 2014-06-28 DIAGNOSIS — Z5189 Encounter for other specified aftercare: Secondary | ICD-10-CM | POA: Insufficient documentation

## 2014-06-28 DIAGNOSIS — I972 Postmastectomy lymphedema syndrome: Secondary | ICD-10-CM

## 2014-06-28 DIAGNOSIS — C50512 Malignant neoplasm of lower-outer quadrant of left female breast: Secondary | ICD-10-CM | POA: Diagnosis not present

## 2014-06-28 DIAGNOSIS — M79671 Pain in right foot: Secondary | ICD-10-CM | POA: Diagnosis not present

## 2014-06-28 DIAGNOSIS — M79672 Pain in left foot: Secondary | ICD-10-CM | POA: Diagnosis not present

## 2014-06-28 NOTE — Therapy (Signed)
[place], [place], [address] Phone: [phone]   Fax:  [phone]  Physical Therapy Treatment  Patient Details  Name: Lori Vincent MRN: [id number] Date of Birth: 04-04-1956 Referring Provider:  [name], NP  Encounter Date: 06/28/2014      PT End of Session - 06/28/14 1247    Visit Number 14   Number of Visits 21   Date for PT Re-Evaluation 07/14/14   PT Start Time 0930   PT Stop Time 1015   PT Time Calculation (min) 45 min      Past Medical History  Diagnosis Date  . Breast cancer 06/03/13    left, 6:00 o'clock  . Depression   . Hypothyroidism   . Hypokalemia   . Anemia due to chemotherapy   . History of radiation therapy 01/18/14- 03/02/14    left breast 4680 cGy 26 sessions, boost of 1000 cGy 5 sessions    Past Surgical History  Procedure Laterality Date  . Tonsillectomy  1972  . Cesarean section  04/1999  . Breast biopsy  1/14    lt  . Dilation and curettage of uterus    . Colonoscopy  2013  . Breast lumpectomy with needle localization and axillary sentinel lymph node bx Left 07/26/2013    Procedure: BREAST LUMPECTOMY WITH NEEDLE LOCALIZATION AND AXILLARY SENTINEL LYMPH NODE BX;  Surgeon: Shann Medal, MD;  Location: [place];  Service: General;  Laterality: Left;  Marland Kitchen Mole removal Right 07/26/2013    Procedure: MOLE REMOVAL UNDER RIGHT BREAST;  Surgeon: Shann Medal, MD;  Location: [place];  Service: General;  Laterality: Right;  . Portacath placement Right 07/26/2013    Procedure: INSERTION PORT-A-CATH;  Surgeon: Shann Medal, MD;  Location: [place];  Service: General;  Laterality: Right;    There were no vitals taken for this visit.  Visit Diagnosis:  Pain in both feet  Weakness of both legs  Lymphedema syndrome, postmastectomy      Subjective Assessment - 06/28/14 0948    Symptoms I had chemo yesterday (once  every 3 weeks)  and was knocked out  having pain on the tops of feet today.    Currently in Pain? Yes   Pain Score 5    Pain Orientation Right;Left   Aggravating Factors  walking properly             [facility] Adult PT Treatment/Exercise - 06/28/14 1240    Knee/Hip Exercises: Aerobic   Stationary Bike 5 minutes at level 2   Knee/Hip Exercises: Supine   Short Arc Quad Sets Strengthening;Both;10 reps  4 # ankle weights   Bridges 5 sets  also instructed in one legged bridge   Straight Leg Raises Right;Left;1 set;10 reps   Straight Leg Raise with External Rotation Left;Right;10 reps   Shoulder Exercises: Supine   Other Supine Exercises chest press 3 sets of 10 with 2#   Other Supine Exercises bicep curls 2 sets of 10 with 2#   Moist Heat Therapy   Number Minutes Moist Heat 15 Minutes   Moist Heat Location --  both feet, especially tops    Manual Therapy   Manual Therapy Massage;Myofascial release   Massage soft and deep tissue work to both feet    Myofascial Release to tender points along instep of both feet   Passive ROM to feet and toes  Long Term Clinic Goals - 06/22/14 1000    CC Long Term Goal  #1   Title Pt. will report at least 40% decrease in foot pain.   Status On-going   CC Long Term Goal  #2   Title Pt. will be independent in self-pain management for foot pain.   Status Partially Met   CC Long Term Goal  #3   Title Pt. will be independent in home exercise program for LE strengthing.   Status On-going   CC Long Term Goal  #4   Title Pt. will report at least 25% greater ease in coming to standing from sitting position.   Status On-going   CC Long Term Goal  #5   Title pt will report a 50% reduction in left lateral chest pain.   Status On-going            Plan - 06/28/14 1248    Clinical Impression Statement suggested pt try "burst" mode of TENS to impove effectiveness for pain relief.  Pt contines to feel relief with myofascial release to  insteps of feet along with moist heat.  Continues to be challenged by proximal hip strengthening work   PT Next Visit Plan Continue with exercise for LEs. Add supine theraband to plantarflexors. Use moist heat and soft tissue work for control of foot pain as needed.        Problem List Patient Active Problem List   Diagnosis Date Noted  . Anemia in neoplastic disease 01/07/2014  . Low magnesium levels 01/06/2014  . Hemorrhoid 11/15/2013  . Hypokalemia 11/04/2013  . Pedal edema 10/14/2013  . Diarrhea 09/23/2013  . GERD (gastroesophageal reflux disease) 08/23/2013  . Anxiety 08/23/2013  . Breast cancer of lower-outer quadrant of left female breast 06/19/2013   Donato Heinz. Owens Shark, PT   06/28/2014, 12:52 PM  [place], [place], [address] Phone: [phone]   Fax:  [phone]

## 2014-07-01 ENCOUNTER — Ambulatory Visit: Payer: BLUE CROSS/BLUE SHIELD | Admitting: Physical Therapy

## 2014-07-01 DIAGNOSIS — Z5189 Encounter for other specified aftercare: Secondary | ICD-10-CM | POA: Diagnosis not present

## 2014-07-01 DIAGNOSIS — M79672 Pain in left foot: Principal | ICD-10-CM

## 2014-07-01 DIAGNOSIS — I972 Postmastectomy lymphedema syndrome: Secondary | ICD-10-CM

## 2014-07-01 DIAGNOSIS — M79671 Pain in right foot: Secondary | ICD-10-CM

## 2014-07-01 DIAGNOSIS — R29898 Other symptoms and signs involving the musculoskeletal system: Secondary | ICD-10-CM

## 2014-07-01 NOTE — Therapy (Signed)
[place], [place], [address] Phone: [phone]   Fax:  [phone]  Physical Therapy Treatment  Patient Details  Name: Lori Vincent MRN: [id number] Date of Birth: 1955/12/27 Referring Provider:  [name], NP  Encounter Date: 07/01/2014      PT End of Session - 07/01/14 1156    Visit Number 15   Number of Visits 21   Date for PT Re-Evaluation 07/14/14   PT Start Time 0940   PT Stop Time 1025   PT Time Calculation (min) 45 min      Past Medical History  Diagnosis Date  . Breast cancer 06/03/13    left, 6:00 o'clock  . Depression   . Hypothyroidism   . Hypokalemia   . Anemia due to chemotherapy   . History of radiation therapy 01/18/14- 03/02/14    left breast 4680 cGy 26 sessions, boost of 1000 cGy 5 sessions    Past Surgical History  Procedure Laterality Date  . Tonsillectomy  1972  . Cesarean section  04/1999  . Breast biopsy  1/14    lt  . Dilation and curettage of uterus    . Colonoscopy  2013  . Breast lumpectomy with needle localization and axillary sentinel lymph node bx Left 07/26/2013    Procedure: BREAST LUMPECTOMY WITH NEEDLE LOCALIZATION AND AXILLARY SENTINEL LYMPH NODE BX;  Surgeon: Shann Medal, MD;  Location: [place];  Service: General;  Laterality: Left;  Marland Kitchen Mole removal Right 07/26/2013    Procedure: MOLE REMOVAL UNDER RIGHT BREAST;  Surgeon: Shann Medal, MD;  Location: [place];  Service: General;  Laterality: Right;  . Portacath placement Right 07/26/2013    Procedure: INSERTION PORT-A-CATH;  Surgeon: Shann Medal, MD;  Location: [place];  Service: General;  Laterality: Right;    There were no vitals taken for this visit.  Visit Diagnosis:  Pain in both feet  Weakness of both legs  Lymphedema syndrome, postmastectomy      Subjective Assessment - 07/01/14 1002    Symptoms I feel good just a little more  tired from treatment on Monday   Currently in Pain? Yes   Pain Score 4    Pain Location Foot   Pain Orientation Right;Left   Pain Descriptors / Indicators [facility] Adult PT Treatment/Exercise - 07/01/14 1004    Knee/Hip Exercises: Aerobic   Stationary Bike 10 minutes at level1    Knee/Hip Exercises: Standing   Forward Step Up Step Height: 6";10 reps   Shoulder Exercises: Supine   Other Supine Exercises chest press 2sets of 10 with 3#   Other Supine Exercises bicep curls 2 sets of 10 with 3#   Moist Heat Therapy   Number Minutes Moist Heat 15 Minutes   Moist Heat Location --  both feet    Manual Therapy   Massage soft tissue and myofascial releast to tender tight areas on both feet   Ankle Exercises: Supine   Other Supine Ankle Exercises AROM espectilly pronation and supination                                       Long Term Clinic Goals - 06/22/14 1000    CC Long Term Goal  #  1   Title Pt. will report at least 40% decrease in foot pain.   Status On-going   CC Long Term Goal  #2   Title Pt. will be independent in self-pain management for foot pain.   Status Partially Met   CC Long Term Goal  #3   Title Pt. will be independent in home exercise program for LE strengthing.   Status On-going   CC Long Term Goal  #4   Title Pt. will report at least 25% greater ease in coming to standing from sitting position.   Status On-going   CC Long Term Goal  #5   Title pt will report a 50% reduction in left lateral chest pain.   Status On-going            Plan - 07/01/14 1157    Clinical Impression Statement Pt feels that she is making slow, steady gains,  Noticed redness at sole of foot and at first metatarsal joint but pt states she usually has that, she had no incrased pain. She felt some relief after moist heat and soft tissue work.    PT Next Visit Plan Continue with exercise for LEs. Add supine theraband to plantarflexors. Use  moist heat and soft tissue work for control of foot pain as needed.        Problem List Patient Active Problem List   Diagnosis Date Noted  . Anemia in neoplastic disease 01/07/2014  . Low magnesium levels 01/06/2014  . Hemorrhoid 11/15/2013  . Hypokalemia 11/04/2013  . Pedal edema 10/14/2013  . Diarrhea 09/23/2013  . GERD (gastroesophageal reflux disease) 08/23/2013  . Anxiety 08/23/2013  . Breast cancer of lower-outer quadrant of left female breast 06/19/2013   Donato Heinz. Owens Shark, PT   07/01/2014, 12:01 PM  [place], [place], [address] Phone: [phone]   Fax:  [phone]

## 2014-07-11 ENCOUNTER — Ambulatory Visit: Payer: BLUE CROSS/BLUE SHIELD | Admitting: Physical Therapy

## 2014-07-13 ENCOUNTER — Ambulatory Visit: Payer: BLUE CROSS/BLUE SHIELD | Admitting: Physical Therapy

## 2014-07-13 DIAGNOSIS — M79671 Pain in right foot: Secondary | ICD-10-CM

## 2014-07-13 DIAGNOSIS — R29898 Other symptoms and signs involving the musculoskeletal system: Secondary | ICD-10-CM

## 2014-07-13 DIAGNOSIS — M79672 Pain in left foot: Principal | ICD-10-CM

## 2014-07-13 DIAGNOSIS — Z5189 Encounter for other specified aftercare: Secondary | ICD-10-CM | POA: Diagnosis not present

## 2014-07-13 NOTE — Therapy (Signed)
[facility] [address] [place], [place], [address] Phone: [phone]   Fax:  [phone]  Physical Therapy Treatment  Patient Details  Name: Lori Vincent MRN: [id number] Date of Birth: 03/19/1956 Referring Provider:  Curtis, Carol G, NP  Encounter Date: 07/13/2014      PT End of Session - 07/13/14 1232    Visit Number 16   Number of Visits 21   Date for PT Re-Evaluation 07/14/14   PT Start Time 1015   PT Stop Time 1110   PT Time Calculation (min) 55 min   Activity Tolerance Patient tolerated treatment well      Past Medical History  Diagnosis Date  . Breast cancer 06/03/13    left, 6:00 o'clock  . Depression   . Hypothyroidism   . Hypokalemia   . Anemia due to chemotherapy   . History of radiation therapy 01/18/14- 03/02/14    left breast 4680 cGy 26 sessions, boost of 1000 cGy 5 sessions    Past Surgical History  Procedure Laterality Date  . Tonsillectomy  1972  . Cesarean section  04/1999  . Breast biopsy  1/14    lt  . Dilation and curettage of uterus    . Colonoscopy  2013  . Breast lumpectomy with needle localization and axillary sentinel lymph node bx Left 07/26/2013    Procedure: BREAST LUMPECTOMY WITH NEEDLE LOCALIZATION AND AXILLARY SENTINEL LYMPH NODE BX;  Surgeon: David H Newman, MD;  Location: [facility];  Service: General;  Laterality: Left;  . Mole removal Right 07/26/2013    Procedure: MOLE REMOVAL UNDER RIGHT BREAST;  Surgeon: David H Newman, MD;  Location: [facility];  Service: General;  Laterality: Right;  . Portacath placement Right 07/26/2013    Procedure: INSERTION PORT-A-CATH;  Surgeon: David H Newman, MD;  [facility];  Service: General;  Laterality: Right;    There were no vitals taken for this visit.  Visit Diagnosis:  Pain in both feet  Weakness of both legs      Subjective Assessment - 07/13/14 1024    Symptoms pt c/o pain at  top on the tops of the foot    Currently in Pain? Yes   Pain Score 5   when walking and weight bearing                    [facility] Adult PT Treatment/Exercise - 07/13/14 1227    Balance   Balance Assessed Yes   Dynamic Standing Balance   Dynamic Standing - Balance Activities Ball toss;Eyes open;Head turns;Foam balance beam   Lumbar Exercises: Stretches   Single Knee to Chest Stretch 3 reps  with towel to deepen stretch   Pelvic Tilt 5 reps   Active Hamstring Stretch 3 reps;20 seconds   Piriformis Stretch 2 reps;20 seconds   Knee/Hip Exercises: Stretches   Gastroc Stretch 3 reps;20 seconds  stretch heels at hip /knee extension machine   Knee/Hip Exercises: Aerobic   Stationary Bike --  nu step with no arms at level 3   Knee/Hip Exercises: Machines for Strengthening   Cybex Leg Press 40# 3 sets of 10 reps, then heel raises 40# 3sets of 10 reps   Knee/Hip Exercises: Standing   Step Down 10 reps;Step Height: 4"  slow eccentric control   Other Standing Knee Exercises cybex hip extension 37.5 pounds at position 4 10 reps on each leg   Shoulder Exercises: ROM/Strengthening   Other   ROM/Strengthening Exercises free motion bilateral elbows to waist, unitlateral elbow extensionion, b   Moist Heat Therapy   Number Minutes Moist Heat 10 Minutes   Moist Heat Location --  to tops of both feet            Long Term Clinic Goals - 06/22/14 1000    CC Long Term Goal  #1   Title Pt. will report at least 40% decrease in foot pain.   Status On-going   CC Long Term Goal  #2   Title Pt. will be independent in self-pain management for foot pain.   Status Partially Met   CC Long Term Goal  #3   Title Pt. will be independent in home exercise program for LE strengthing.   Status On-going   CC Long Term Goal  #4   Title Pt. will report at least 25% greater ease in coming to standing from sitting position.   Status On-going   CC Long Term Goal  #5   Title pt will report a 50%  reduction in left lateral chest pain.   Status On-going            Plan - 07/13/14 1233    Clinical Impression Statement Pt feels that she is making gradual gains. She continues to have pain at the tops of her feet, more in weight bearing, and has erythema at the soles of her feet. She did well with increased resistance exercise  today and should be OK to increase resistance further next treatment   PT Next Visit Plan increase weight on resistance machines if patient with no prolonged muscle soreness        Problem List Patient Active Problem List   Diagnosis Date Noted  . Anemia in neoplastic disease 01/07/2014  . Low magnesium levels 01/06/2014  . Hemorrhoid 11/15/2013  . Hypokalemia 11/04/2013  . Pedal edema 10/14/2013  . Diarrhea 09/23/2013  . GERD (gastroesophageal reflux disease) 08/23/2013  . Anxiety 08/23/2013  . Breast cancer of lower-outer quadrant of left female breast 06/19/2013   Teresa K. Brown, PT   07/13/2014, 12:38 PM  [facility] [address] [place], [place], [address] Phone: [phone]   Fax:  [phone]      

## 2014-07-15 ENCOUNTER — Ambulatory Visit: Payer: BLUE CROSS/BLUE SHIELD | Admitting: Physical Therapy

## 2014-07-15 DIAGNOSIS — R29898 Other symptoms and signs involving the musculoskeletal system: Secondary | ICD-10-CM

## 2014-07-15 DIAGNOSIS — I972 Postmastectomy lymphedema syndrome: Secondary | ICD-10-CM

## 2014-07-15 DIAGNOSIS — M79671 Pain in right foot: Secondary | ICD-10-CM

## 2014-07-15 DIAGNOSIS — M79672 Pain in left foot: Principal | ICD-10-CM

## 2014-07-15 DIAGNOSIS — R269 Unspecified abnormalities of gait and mobility: Secondary | ICD-10-CM

## 2014-07-15 DIAGNOSIS — Z5189 Encounter for other specified aftercare: Secondary | ICD-10-CM | POA: Diagnosis not present

## 2014-07-15 NOTE — Therapy (Addendum)
[place], [place], [address] Phone: [phone]   Fax:  [phone]  Physical Therapy Treatment  Patient Details  Name: Lori Vincent MRN: [id number] Date of Birth: Jan 14, 1956 Referring Provider:  [name], NP  Encounter Date: 07/15/2014      PT End of Session - 07/15/14 1245    Visit Number 17   Number of Visits 33   Date for PT Re-Evaluation 09/12/14   PT Start Time 0930   PT Stop Time 1015   PT Time Calculation (min) 45 min      Past Medical History  Diagnosis Date  . Breast cancer 06/03/13    left, 6:00 o'clock  . Depression   . Hypothyroidism   . Hypokalemia   . Anemia due to chemotherapy   . History of radiation therapy 01/18/14- 03/02/14    left breast 4680 cGy 26 sessions, boost of 1000 cGy 5 sessions    Past Surgical History  Procedure Laterality Date  . Tonsillectomy  1972  . Cesarean section  04/1999  . Breast biopsy  1/14    lt  . Dilation and curettage of uterus    . Colonoscopy  2013  . Breast lumpectomy with needle localization and axillary sentinel lymph node bx Left 07/26/2013    Procedure: BREAST LUMPECTOMY WITH NEEDLE LOCALIZATION AND AXILLARY SENTINEL LYMPH NODE BX;  Surgeon: Shann Medal, MD;  Location: [facility];  Service: General;  Laterality: Left;  Marland Kitchen Mole removal Right 07/26/2013    Procedure: MOLE REMOVAL UNDER RIGHT BREAST;  Surgeon: Shann Medal, MD;  Location: [place];  Service: General;  Laterality: Right;  . Portacath placement Right 07/26/2013    Procedure: INSERTION PORT-A-CATH;  Surgeon: Shann Medal, MD;  Location: [place];  Service: General;  Laterality: Right;    There were no vitals taken for this visit.  Visit Diagnosis:  Pain in both feet  Lymphedema syndrome, postmastectomy  Weakness of both legs  Abnormality of gait and mobility      Subjective Assessment - 07/15/14 0946    Symptoms pt had some delayed onset muscle sorenss after strength training.  She still has pain in tops of feet when she walks, left > right today. Overall she says 'I can tell I am getting stronger"  but she has some days that are better that other.   Currently in Pain? Yes   Pain Score 4    Pain Orientation Right;Left  left greater than right today                    [facility] Adult PT Treatment/Exercise - 07/15/14 0959    Dynamic Standing Balance   Dynamic Standing - Balance Activities Ball toss;Eyes open;Head turns;Foam balance beam   Lumbar Exercises: Stretches   Single Knee to Chest Stretch 3 reps  with towel to deepen stretch   Pelvic Tilt 5 reps   Lumbar Exercises: Supine   Bridge 10 reps   Other Supine Lumbar Exercises pelvic tilt for back stretch   Knee/Hip Exercises: Stretches   Active Hamstring Stretch 3 reps;20 seconds   Gastroc Stretch --  stretch heels at hip /knee extension machine   Knee/Hip Exercises: Aerobic   Stationary Bike --  nu step with no arms at level 3   Knee/Hip Exercises: Machines for Strengthening   Cybex Leg Press 60 # 2 sets of 10 reps, then heel raises 60# 2 sets  of 10 reps   Knee/Hip Exercises: Standing   Step Down --   Other Standing Knee Exercises cybex hip extension 37.5 pounds at position 4 10 reps on each leg   Shoulder Exercises: Supine   Other Supine Exercises chest press 2sets of 10 with 3#   Shoulder Exercises: ROM/Strengthening   Other ROM/Strengthening Exercises free motion bilateral elbows to waist, unitlateral elbow extensionion, b   Moist Heat Therapy   Number Minutes Moist Heat 10 Minutes   Moist Heat Location --  to tops of both feet                   Short Term Clinic Goals - 07/15/14 1308    CC Short Term Goal  #1   Title Pt. will report at least 40% decrease in foot pain.   Time 4   Period Weeks   Status On-going   CC Short Term Goal  #2   Title Pt. will report at least 25% greater ease in coming to  standing from sitting position.   Time 4   Period Weeks   Status On-going             Long Term Clinic Goals - 07/15/14 [phone]    CC Long Term Goal  #1   Title Pt. will report at least 40% decrease in foot pain.   Status Partially Met   CC Long Term Goal  #2   Title Pt. will be independent in self-pain management for foot pain.   Time 8   Period Weeks   Status Partially Met   CC Long Term Goal  #3   Title Pt. will be independent in home exercise program for LE strengthing.   Status Partially Met   CC Long Term Goal  #4   Title Pt. will report at least 75% greater ease in coming to standing from sitting position.   Time 8   Period Weeks   Status Revised   CC Long Term Goal  #5   Title pt will report a 50% reduction in left lateral chest pain.   Time 8   Period Weeks   Status On-going            Plan - 07/15/14 1246    Clinical Impression Statement Pt feels that she is getting better but still has a ways to go.  She would like to continue with physical therapy to continue strengthening especially in thighs and hips and continue working on decreasing pain . She want to transition to working out at [employer] again  She continues with erythema at soles of feet with weight bearing   Pt will benefit from skilled therapeutic intervention in order to improve on the following deficits Abnormal gait;Decreased strength;Pain;Impaired sensation;Difficulty walking;Decreased mobility;Decreased balance   Rehab Potential Good   Clinical Impairments Affecting Rehab Potential chemotherapy induced peripheral neuropathy   PT Frequency 2x / week   PT Duration 8 weeks   PT Treatment/Interventions Therapeutic exercise;Patient/family education;Moist Heat;Other (comment);Manual techniques;Therapeutic activities;Balance training;Cryotherapy;Neuromuscular re-education;Electrical Stimulation;Gait training  vasopneumatic compression   PT Next Visit Plan Continue with progressive resistance on leg  strengthening machines.  Begin instruction in strength ABC program so she can continue with that at home while with do other techniqes for balance and pain management in the clinic   PT Home Exercise Plan strength ABC and LE stretching   Consulted and Agree with Plan of Care Patient        Problem List Patient Active Problem  List   Diagnosis Date Noted  . Anemia in neoplastic disease 01/07/2014  . Low magnesium levels 01/06/2014  . Hemorrhoid 11/15/2013  . Hypokalemia 11/04/2013  . Pedal edema 10/14/2013  . Diarrhea 09/23/2013  . GERD (gastroesophageal reflux disease) 08/23/2013  . Anxiety 08/23/2013  . Breast cancer of lower-outer quadrant of left female breast 06/19/2013   Donato Heinz. Owens Shark, PT   07/15/2014, 1:12 PM      PHYSICAL THERAPY DISCHARGE SUMMARY  Visits from Start of Care: 17  Current functional level related to goals / functional outcomes: As above    Remaining deficits: LE weakness and gait impairment    Education / Equipment: Home exercise  Plan: Patient agrees to discharge.  Patient goals were partially met. Patient is being discharged due to not returning since the last visit.  ?????        Maudry Diego, PT 06/22/2015 10:32 AM  [place], [place], [address] Phone: [phone]   Fax:  [phone]

## 2014-07-18 ENCOUNTER — Ambulatory Visit (HOSPITAL_BASED_OUTPATIENT_CLINIC_OR_DEPARTMENT_OTHER): Payer: BLUE CROSS/BLUE SHIELD

## 2014-07-18 ENCOUNTER — Other Ambulatory Visit: Payer: Self-pay | Admitting: *Deleted

## 2014-07-18 ENCOUNTER — Other Ambulatory Visit (HOSPITAL_BASED_OUTPATIENT_CLINIC_OR_DEPARTMENT_OTHER): Payer: BLUE CROSS/BLUE SHIELD

## 2014-07-18 DIAGNOSIS — C50812 Malignant neoplasm of overlapping sites of left female breast: Secondary | ICD-10-CM

## 2014-07-18 DIAGNOSIS — C50512 Malignant neoplasm of lower-outer quadrant of left female breast: Secondary | ICD-10-CM

## 2014-07-18 DIAGNOSIS — Z5112 Encounter for antineoplastic immunotherapy: Secondary | ICD-10-CM

## 2014-07-18 LAB — CBC WITH DIFFERENTIAL/PLATELET
BASO%: 0.3 % (ref 0.0–2.0)
BASOS ABS: 0 10*3/uL (ref 0.0–0.1)
EOS%: 4 % (ref 0.0–7.0)
Eosinophils Absolute: 0.2 10*3/uL (ref 0.0–0.5)
HCT: 36.4 % (ref 34.8–46.6)
HGB: 11.8 g/dL (ref 11.6–15.9)
LYMPH%: 28.5 % (ref 14.0–49.7)
MCH: 29.8 pg (ref 25.1–34.0)
MCHC: 32.4 g/dL (ref 31.5–36.0)
MCV: 91.9 fL (ref 79.5–101.0)
MONO#: 0.3 10*3/uL (ref 0.1–0.9)
MONO%: 8.2 % (ref 0.0–14.0)
NEUT%: 59 % (ref 38.4–76.8)
NEUTROS ABS: 2.2 10*3/uL (ref 1.5–6.5)
PLATELETS: 165 10*3/uL (ref 145–400)
RBC: 3.96 10*6/uL (ref 3.70–5.45)
RDW: 15 % — ABNORMAL HIGH (ref 11.2–14.5)
WBC: 3.8 10*3/uL — ABNORMAL LOW (ref 3.9–10.3)
lymph#: 1.1 10*3/uL (ref 0.9–3.3)

## 2014-07-18 LAB — COMPREHENSIVE METABOLIC PANEL (CC13)
ALT: 14 U/L (ref 0–55)
AST: 15 U/L (ref 5–34)
Albumin: 3.7 g/dL (ref 3.5–5.0)
Alkaline Phosphatase: 85 U/L (ref 40–150)
Anion Gap: 9 mEq/L (ref 3–11)
BILIRUBIN TOTAL: 0.38 mg/dL (ref 0.20–1.20)
BUN: 13.9 mg/dL (ref 7.0–26.0)
CO2: 26 mEq/L (ref 22–29)
Calcium: 9.4 mg/dL (ref 8.4–10.4)
Chloride: 108 mEq/L (ref 98–109)
Creatinine: 0.8 mg/dL (ref 0.6–1.1)
EGFR: 82 mL/min/{1.73_m2} — ABNORMAL LOW (ref >90)
Glucose: 133 mg/dl (ref 70–140)
Potassium: 3.5 mEq/L (ref 3.5–5.1)
Sodium: 143 mEq/L (ref 136–145)
Total Protein: 6.2 g/dL — ABNORMAL LOW (ref 6.4–8.3)

## 2014-07-18 MED ORDER — ACETAMINOPHEN 325 MG PO TABS
650.0000 mg | ORAL_TABLET | Freq: Once | ORAL | Status: AC
  Administered 2014-07-18: 650 mg via ORAL

## 2014-07-18 MED ORDER — SODIUM CHLORIDE 0.9 % IV SOLN
Freq: Once | INTRAVENOUS | Status: AC
  Administered 2014-07-18: 10:00:00 via INTRAVENOUS

## 2014-07-18 MED ORDER — DIPHENHYDRAMINE HCL 25 MG PO CAPS
ORAL_CAPSULE | ORAL | Status: AC
  Filled 2014-07-18: qty 1

## 2014-07-18 MED ORDER — TRASTUZUMAB CHEMO INJECTION 440 MG
6.0000 mg/kg | Freq: Once | INTRAVENOUS | Status: AC
  Administered 2014-07-18: 567 mg via INTRAVENOUS
  Filled 2014-07-18: qty 27

## 2014-07-18 MED ORDER — HEPARIN SOD (PORK) LOCK FLUSH 100 UNIT/ML IV SOLN
500.0000 [IU] | Freq: Once | INTRAVENOUS | Status: AC | PRN
  Administered 2014-07-18: 500 [IU]
  Filled 2014-07-18: qty 5

## 2014-07-18 MED ORDER — SODIUM CHLORIDE 0.9 % IJ SOLN
10.0000 mL | INTRAMUSCULAR | Status: DC | PRN
  Administered 2014-07-18: 10 mL
  Filled 2014-07-18: qty 10

## 2014-07-18 MED ORDER — DIPHENHYDRAMINE HCL 25 MG PO CAPS
25.0000 mg | ORAL_CAPSULE | Freq: Once | ORAL | Status: AC
  Administered 2014-07-18: 25 mg via ORAL

## 2014-07-18 MED ORDER — ACETAMINOPHEN 325 MG PO TABS
ORAL_TABLET | ORAL | Status: AC
  Filled 2014-07-18: qty 2

## 2014-07-18 NOTE — Patient Instructions (Signed)
[facility] Discharge Instructions for Patients Receiving Chemotherapy  Today you received the following chemotherapy agents Herceptin.  To help prevent nausea and vomiting after your treatment, we encourage you to take your nausea medication as prescribed.   If you develop nausea and vomiting that is not controlled by your nausea medication, call the clinic.   BELOW ARE SYMPTOMS THAT SHOULD BE REPORTED IMMEDIATELY:  *FEVER GREATER THAN 100.5 F  *CHILLS WITH OR WITHOUT FEVER  NAUSEA AND VOMITING THAT IS NOT CONTROLLED WITH YOUR NAUSEA MEDICATION  *UNUSUAL SHORTNESS OF BREATH  *UNUSUAL BRUISING OR BLEEDING  TENDERNESS IN MOUTH AND THROAT WITH OR WITHOUT PRESENCE OF ULCERS  *URINARY PROBLEMS  *BOWEL PROBLEMS  UNUSUAL RASH Items with * indicate a potential emergency and should be followed up as soon as possible.  Feel free to call the clinic you have any questions or concerns. The clinic phone number is [phone].    

## 2014-07-19 ENCOUNTER — Ambulatory Visit: Payer: BLUE CROSS/BLUE SHIELD | Admitting: Physical Therapy

## 2014-07-19 DIAGNOSIS — I972 Postmastectomy lymphedema syndrome: Secondary | ICD-10-CM

## 2014-07-19 DIAGNOSIS — R29898 Other symptoms and signs involving the musculoskeletal system: Secondary | ICD-10-CM

## 2014-07-19 DIAGNOSIS — Z5189 Encounter for other specified aftercare: Secondary | ICD-10-CM | POA: Diagnosis not present

## 2014-07-19 DIAGNOSIS — R269 Unspecified abnormalities of gait and mobility: Secondary | ICD-10-CM

## 2014-07-19 DIAGNOSIS — M79671 Pain in right foot: Secondary | ICD-10-CM

## 2014-07-19 DIAGNOSIS — M79672 Pain in left foot: Principal | ICD-10-CM

## 2014-07-19 NOTE — Therapy (Signed)
[place], [place], [address] Phone: [phone]   Fax:  [phone]  Physical Therapy Treatment  Patient Details  Name: Lori Vincent MRN: [id number] Date of Birth: 01/23/1956 Referring Provider:  [name], NP  Encounter Date: 07/19/2014      PT End of Session - 07/19/14 1234    Visit Number 18   Number of Visits 33   Date for PT Re-Evaluation 09/12/14   PT Start Time 0932   PT Stop Time 6962   PT Time Calculation (min) 43 min   Activity Tolerance Patient tolerated treatment well      Past Medical History  Diagnosis Date  . Breast cancer 06/03/13    left, 6:00 o'clock  . Depression   . Hypothyroidism   . Hypokalemia   . Anemia due to chemotherapy   . History of radiation therapy 01/18/14- 03/02/14    left breast 4680 cGy 26 sessions, boost of 1000 cGy 5 sessions    Past Surgical History  Procedure Laterality Date  . Tonsillectomy  1972  . Cesarean section  04/1999  . Breast biopsy  1/14    lt  . Dilation and curettage of uterus    . Colonoscopy  2013  . Breast lumpectomy with needle localization and axillary sentinel lymph node bx Left 07/26/2013    Procedure: BREAST LUMPECTOMY WITH NEEDLE LOCALIZATION AND AXILLARY SENTINEL LYMPH NODE BX;  Surgeon: Shann Medal, MD;  Location: [facility];  Service: General;  Laterality: Left;  Marland Kitchen Mole removal Right 07/26/2013    Procedure: MOLE REMOVAL UNDER RIGHT BREAST;  Surgeon: Shann Medal, MD;  Location: [facility];  Service: General;  Laterality: Right;  . Portacath placement Right 07/26/2013    Procedure: INSERTION PORT-A-CATH;  Surgeon: Shann Medal, MD;  Location: [facility];  Service: General;  Laterality: Right;    There were no vitals taken for this visit.  Visit Diagnosis:  Pain in both feet  Lymphedema syndrome, postmastectomy  Weakness of both legs  Abnormality of gait and  mobility      Subjective Assessment - 07/19/14 0934    Symptoms Pt states she had a chemo treatment yesteday and slept the rest of  the day.  Some soreness after last session that has resolved.  Still with pain at top of the feet   Currently in Pain? Yes   Pain Score 3   with walking   Pain Orientation Right;Left  top of feet    Aggravating Factors  walking with heel strike/toe off                    [facility] Adult PT Treatment/Exercise - 07/19/14 0001    Lumbar Exercises: Stretches   Single Knee to Chest Stretch 3 reps  with towel to deepen stretch   Pelvic Tilt 5 reps   Knee/Hip Exercises: Stretches   Active Hamstring Stretch 3 reps;20 seconds   Gastroc Stretch --  stretch heels at hip /knee extension machine   Knee/Hip Exercises: Aerobic   Stationary Bike 15 minutes at level1   nu step with no arms at level 3   Knee/Hip Exercises: Machines for Strengthening   Cybex Knee Flexion --  hip flexion 25# 2 sets of 10 -= position 2    Cybex Leg Press 60 # 3 sets of 10 reps, then heel raises 60# 3 sets of 10 reps   Knee/Hip Exercises: Standing  Other Standing Knee Exercises cybex hip extension 37.5 pounds at position 4 10 reps on each leg   Shoulder Exercises: Supine   Other Supine Exercises chest press 2sets of 10 with 3#   Shoulder Exercises: ROM/Strengthening   Other ROM/Strengthening Exercises free motion bilateral elbows to waist, unitlateral elbow extensionion, b   Moist Heat Therapy   Number Minutes Moist Heat 10 Minutes   Moist Heat Location --  to tops of both feet                PT Education - 07/19/14 1233    Education provided Yes   Education Details Introduction to Strength ABC program and [email or web address] lymphedema educatoin session   Person(s) Educated Patient   Methods Explanation;Demonstration;Handout   Comprehension Need further instruction           Short Term Clinic Goals - 07/15/14 1308    CC Short Term Goal  #1   Title Pt.  will report at least 40% decrease in foot pain.   Time 4   Period Weeks   Status On-going   CC Short Term Goal  #2   Title Pt. will report at least 25% greater ease in coming to standing from sitting position.   Time 4   Period Weeks   Status On-going             Long Term Clinic Goals - 07/15/14 [phone]    CC Long Term Goal  #1   Title Pt. will report at least 40% decrease in foot pain.   Status Partially Met   CC Long Term Goal  #2   Title Pt. will be independent in self-pain management for foot pain.   Time 8   Period Weeks   Status Partially Met   CC Long Term Goal  #3   Title Pt. will be independent in home exercise program for LE strengthing.   Status Partially Met   CC Long Term Goal  #4   Title Pt. will report at least 75% greater ease in coming to standing from sitting position.   Time 8   Period Weeks   Status Revised   CC Long Term Goal  #5   Title pt will report a 50% reduction in left lateral chest pain.   Time 8   Period Weeks   Status On-going            Plan - 07/19/14 1239    PT Next Visit Plan continue with strength ABC instructiion of stretches, core and strengthning with logging.Continue with progressive resistance on leg strengthening machines and moist packs to feet            Problem List Patient Active Problem List   Diagnosis Date Noted  . Anemia in neoplastic disease 01/07/2014  . Low magnesium levels 01/06/2014  . Hemorrhoid 11/15/2013  . Hypokalemia 11/04/2013  . Pedal edema 10/14/2013  . Diarrhea 09/23/2013  . GERD (gastroesophageal reflux disease) 08/23/2013  . Anxiety 08/23/2013  . Breast cancer of lower-outer quadrant of left female breast 06/19/2013   Donato Heinz. Owens Shark, PT   07/19/2014, 12:41 PM  [place], [place], [address] Phone: [phone]   Fax:  [phone]

## 2014-07-25 ENCOUNTER — Ambulatory Visit: Payer: BLUE CROSS/BLUE SHIELD

## 2014-07-25 DIAGNOSIS — M79672 Pain in left foot: Principal | ICD-10-CM

## 2014-07-25 DIAGNOSIS — R29898 Other symptoms and signs involving the musculoskeletal system: Secondary | ICD-10-CM

## 2014-07-25 DIAGNOSIS — I972 Postmastectomy lymphedema syndrome: Secondary | ICD-10-CM

## 2014-07-25 DIAGNOSIS — R269 Unspecified abnormalities of gait and mobility: Secondary | ICD-10-CM

## 2014-07-25 DIAGNOSIS — M79671 Pain in right foot: Secondary | ICD-10-CM

## 2014-07-25 DIAGNOSIS — Z5189 Encounter for other specified aftercare: Secondary | ICD-10-CM | POA: Diagnosis not present

## 2014-07-25 NOTE — Therapy (Signed)
[place], [place], [address] Phone: [phone]   Fax:  [phone]  Physical Therapy Treatment  Patient Details  Name: Lori Vincent MRN: [id number] Date of Birth: 1955-08-17 Referring Provider:  [name], NP  Encounter Date: 07/25/2014      PT End of Session - 07/25/14 1009    Visit Number 19   Number of Visits 33   Date for PT Re-Evaluation 09/12/14   PT Start Time 0938   PT Stop Time 1031   PT Time Calculation (min) 53 min      Past Medical History  Diagnosis Date  . Breast cancer 06/03/13    left, 6:00 o'clock  . Depression   . Hypothyroidism   . Hypokalemia   . Anemia due to chemotherapy   . History of radiation therapy 01/18/14- 03/02/14    left breast 4680 cGy 26 sessions, boost of 1000 cGy 5 sessions    Past Surgical History  Procedure Laterality Date  . Tonsillectomy  1972  . Cesarean section  04/1999  . Breast biopsy  1/14    lt  . Dilation and curettage of uterus    . Colonoscopy  2013  . Breast lumpectomy with needle localization and axillary sentinel lymph node bx Left 07/26/2013    Procedure: BREAST LUMPECTOMY WITH NEEDLE LOCALIZATION AND AXILLARY SENTINEL LYMPH NODE BX;  Surgeon: Shann Medal, MD;  Location: [facility];  Service: General;  Laterality: Left;  Marland Kitchen Mole removal Right 07/26/2013    Procedure: MOLE REMOVAL UNDER RIGHT BREAST;  Surgeon: Shann Medal, MD;  Location: [facility];  Service: General;  Laterality: Right;  . Portacath placement Right 07/26/2013    Procedure: INSERTION PORT-A-CATH;  Surgeon: Shann Medal, MD;  Location: [facility];  Service: General;  Laterality: Right;    There were no vitals taken for this visit.  Visit Diagnosis:  Pain in both feet  Lymphedema syndrome, postmastectomy  Weakness of both legs  Abnormality of gait and mobility      Subjective Assessment - 07/25/14 0942     Symptoms Doign okay but my feet pain flared up over the weekend. Miss having the massage at the end of treatment because that was giving me longer relief than the heat.   Currently in Pain? Yes   Pain Score 5    Pain Location Foot   Pain Orientation Right;Left   Pain Descriptors / Indicators Burning;Tender                    [facility] Adult PT Treatment/Exercise - 07/25/14 0001    Knee/Hip Exercises: Aerobic   Stationary Bike Nu Step Level 5, 10 minutes    Knee/Hip Exercises: Machines for Strengthening   Cybex Leg Press 60 # 3 sets of 10 reps, then heel raises 60# 3 sets of 10 reps   Shoulder Exercises: ROM/Strengthening   Other ROM/Strengthening Exercises Continued instruction: Squats, standing "W" against wall, standing row all 10 reps   Moist Heat Therapy   Number Minutes Moist Heat 10 Minutes   Moist Heat Location --  Tops of bil feet   Manual Therapy   Massage Soft-deep tissue work to [employer] Term Clinic Goals - 07/15/14 1308    CC Short Term Goal  #1   Title Pt. will report at least  40% decrease in foot pain.   Time 4   Period Weeks   Status On-going   CC Short Term Goal  #2   Title Pt. will report at least 25% greater ease in coming to standing from sitting position.   Time 4   Period Weeks   Status On-going             Long Term Clinic Goals - 07/15/14 [phone]    CC Long Term Goal  #1   Title Pt. will report at least 40% decrease in foot pain.   Status Partially Met   CC Long Term Goal  #2   Title Pt. will be independent in self-pain management for foot pain.   Time 8   Period Weeks   Status Partially Met   CC Long Term Goal  #3   Title Pt. will be independent in home exercise program for LE strengthing.   Status Partially Met   CC Long Term Goal  #4   Title Pt. will report at least 75% greater ease in coming to standing from sitting position.   Time 8   Period Weeks   Status Revised   CC Long Term Goal  #5    Title pt will report a 50% reduction in left lateral chest pain.   Time 8   Period Weeks   Status On-going            Plan - 07/25/14 1009    Clinical Impression Statement Continued instruction in Strength ABC Program and pt was issued information on [employer] program at the [facility] and she is very interested.   Pt will benefit from skilled therapeutic intervention in order to improve on the following deficits Abnormal gait;Decreased strength;Pain;Impaired sensation;Difficulty walking;Decreased mobility;Decreased balance   Rehab Potential Good   Clinical Impairments Affecting Rehab Potential chemotherapy induced peripheral neuropathy   PT Frequency 2x / week   PT Duration 8 weeks   PT Treatment/Interventions Therapeutic exercise;Patient/family education;Moist Heat;Other (comment);Manual techniques;Therapeutic activities;Balance training;Cryotherapy;Neuromuscular re-education;Electrical Stimulation;Gait training   PT Next Visit Plan continue with strength ABC instructiion of stretches, core and strengthning with logging.Continue with progressive resistance on leg strengthening machines and moist packs to feet       PT Home Exercise Plan strength ABC and LE stretching        Problem List Patient Active Problem List   Diagnosis Date Noted  . Anemia in neoplastic disease 01/07/2014  . Low magnesium levels 01/06/2014  . Hemorrhoid 11/15/2013  . Hypokalemia 11/04/2013  . Pedal edema 10/14/2013  . Diarrhea 09/23/2013  . GERD (gastroesophageal reflux disease) 08/23/2013  . Anxiety 08/23/2013  . Breast cancer of lower-outer quadrant of left female breast 06/19/2013    Otelia Limes, PTA 07/25/2014, 10:16 AM  [place], [place], [address] Phone: [phone]   Fax:  [phone]

## 2014-07-27 ENCOUNTER — Ambulatory Visit: Payer: BLUE CROSS/BLUE SHIELD

## 2014-08-01 ENCOUNTER — Ambulatory Visit: Payer: BLUE CROSS/BLUE SHIELD

## 2014-08-03 ENCOUNTER — Ambulatory Visit: Payer: BLUE CROSS/BLUE SHIELD

## 2014-08-08 ENCOUNTER — Encounter: Payer: Self-pay | Admitting: Nurse Practitioner

## 2014-08-08 ENCOUNTER — Ambulatory Visit: Payer: BLUE CROSS/BLUE SHIELD | Admitting: Nurse Practitioner

## 2014-08-08 ENCOUNTER — Other Ambulatory Visit: Payer: Self-pay | Admitting: *Deleted

## 2014-08-08 ENCOUNTER — Ambulatory Visit (HOSPITAL_BASED_OUTPATIENT_CLINIC_OR_DEPARTMENT_OTHER): Payer: BLUE CROSS/BLUE SHIELD | Admitting: Nurse Practitioner

## 2014-08-08 ENCOUNTER — Telehealth: Payer: Self-pay | Admitting: Oncology

## 2014-08-08 ENCOUNTER — Ambulatory Visit (HOSPITAL_BASED_OUTPATIENT_CLINIC_OR_DEPARTMENT_OTHER): Payer: BLUE CROSS/BLUE SHIELD

## 2014-08-08 ENCOUNTER — Other Ambulatory Visit (HOSPITAL_BASED_OUTPATIENT_CLINIC_OR_DEPARTMENT_OTHER): Payer: BLUE CROSS/BLUE SHIELD

## 2014-08-08 VITALS — BP 137/68 | HR 74 | Temp 98.0°F | Resp 18 | Ht 68.0 in | Wt 215.0 lb

## 2014-08-08 DIAGNOSIS — N941 Dyspareunia: Secondary | ICD-10-CM | POA: Diagnosis not present

## 2014-08-08 DIAGNOSIS — D6481 Anemia due to antineoplastic chemotherapy: Secondary | ICD-10-CM

## 2014-08-08 DIAGNOSIS — C50812 Malignant neoplasm of overlapping sites of left female breast: Secondary | ICD-10-CM

## 2014-08-08 DIAGNOSIS — C50512 Malignant neoplasm of lower-outer quadrant of left female breast: Secondary | ICD-10-CM

## 2014-08-08 DIAGNOSIS — Z5112 Encounter for antineoplastic immunotherapy: Secondary | ICD-10-CM

## 2014-08-08 DIAGNOSIS — Z170 Estrogen receptor positive status [ER+]: Secondary | ICD-10-CM

## 2014-08-08 DIAGNOSIS — F341 Dysthymic disorder: Secondary | ICD-10-CM

## 2014-08-08 LAB — COMPREHENSIVE METABOLIC PANEL (CC13)
ALK PHOS: 83 U/L (ref 40–150)
ALT: 20 U/L (ref 0–55)
AST: 17 U/L (ref 5–34)
Albumin: 3.6 g/dL (ref 3.5–5.0)
Anion Gap: 10 mEq/L (ref 3–11)
BILIRUBIN TOTAL: 0.3 mg/dL (ref 0.20–1.20)
BUN: 18.9 mg/dL (ref 7.0–26.0)
CHLORIDE: 104 meq/L (ref 98–109)
CO2: 29 mEq/L (ref 22–29)
Calcium: 9.4 mg/dL (ref 8.4–10.4)
Creatinine: 0.8 mg/dL (ref 0.6–1.1)
EGFR: 86 mL/min/{1.73_m2} — ABNORMAL LOW (ref >90)
Glucose: 114 mg/dl (ref 70–140)
Potassium: 3.6 mEq/L (ref 3.5–5.1)
Sodium: 143 mEq/L (ref 136–145)
Total Protein: 6.6 g/dL (ref 6.4–8.3)

## 2014-08-08 LAB — CBC WITH DIFFERENTIAL/PLATELET
BASO%: 0.6 % (ref 0.0–2.0)
Basophils Absolute: 0 10*3/uL (ref 0.0–0.1)
EOS%: 4 % (ref 0.0–7.0)
Eosinophils Absolute: 0.2 10*3/uL (ref 0.0–0.5)
HEMATOCRIT: 38.8 % (ref 34.8–46.6)
HGB: 12.4 g/dL (ref 11.6–15.9)
LYMPH%: 24.9 % (ref 14.0–49.7)
MCH: 29.2 pg (ref 25.1–34.0)
MCHC: 32 g/dL (ref 31.5–36.0)
MCV: 91.5 fL (ref 79.5–101.0)
MONO#: 0.5 10*3/uL (ref 0.1–0.9)
MONO%: 9.2 % (ref 0.0–14.0)
NEUT#: 3.2 10*3/uL (ref 1.5–6.5)
NEUT%: 61.3 % (ref 38.4–76.8)
PLATELETS: 183 10*3/uL (ref 145–400)
RBC: 4.24 10*6/uL (ref 3.70–5.45)
RDW: 14.4 % (ref 11.2–14.5)
WBC: 5.2 10*3/uL (ref 3.9–10.3)
lymph#: 1.3 10*3/uL (ref 0.9–3.3)

## 2014-08-08 MED ORDER — HEPARIN SOD (PORK) LOCK FLUSH 100 UNIT/ML IV SOLN
500.0000 [IU] | Freq: Once | INTRAVENOUS | Status: AC | PRN
  Administered 2014-08-08: 500 [IU]
  Filled 2014-08-08: qty 5

## 2014-08-08 MED ORDER — DIPHENHYDRAMINE HCL 25 MG PO CAPS
ORAL_CAPSULE | ORAL | Status: AC
  Filled 2014-08-08: qty 1

## 2014-08-08 MED ORDER — TRASTUZUMAB CHEMO INJECTION 440 MG
6.0000 mg/kg | Freq: Once | INTRAVENOUS | Status: AC
  Administered 2014-08-08: 567 mg via INTRAVENOUS
  Filled 2014-08-08: qty 27

## 2014-08-08 MED ORDER — SODIUM CHLORIDE 0.9 % IV SOLN
Freq: Once | INTRAVENOUS | Status: AC
  Administered 2014-08-08: 11:00:00 via INTRAVENOUS

## 2014-08-08 MED ORDER — ACETAMINOPHEN 325 MG PO TABS
650.0000 mg | ORAL_TABLET | Freq: Once | ORAL | Status: AC
  Administered 2014-08-08: 650 mg via ORAL

## 2014-08-08 MED ORDER — LORAZEPAM 0.5 MG PO TABS
0.5000 mg | ORAL_TABLET | Freq: Two times a day (BID) | ORAL | Status: DC | PRN
Start: 2014-08-08 — End: 2014-10-06

## 2014-08-08 MED ORDER — DIPHENHYDRAMINE HCL 25 MG PO CAPS
25.0000 mg | ORAL_CAPSULE | Freq: Once | ORAL | Status: AC
  Administered 2014-08-08: 25 mg via ORAL

## 2014-08-08 MED ORDER — ACETAMINOPHEN 325 MG PO TABS
ORAL_TABLET | ORAL | Status: AC
  Filled 2014-08-08: qty 2

## 2014-08-08 MED ORDER — SODIUM CHLORIDE 0.9 % IJ SOLN
10.0000 mL | INTRAMUSCULAR | Status: DC | PRN
  Administered 2014-08-08: 10 mL
  Filled 2014-08-08: qty 10

## 2014-08-08 NOTE — Telephone Encounter (Signed)
appts made and pt will get avs in chemo

## 2014-08-08 NOTE — Progress Notes (Signed)
[facility]  [phone] [phone]     ID: Lori Vincent OB: 1955-09-04  MR#: [id number]  YWV#:[id number]  PCP: [name], NP GYN:  [name], MD SU: [name], MD OTHER MD: Arloa Koh, MD  CHIEF COMPLAINT: estrogen receptor positive breast cancer CURRENT TREATMENT: anti-HER-2 therapy, tamoxifen daily  HISTORY OF PRESENT ILLNESS: From the original intake note:  Lori Vincent [facility] showing a possible mass in the left breast. Diagnostic left mammography and ultrasonography at the breast center 04/26/2013 confirmed an oval mass with microcalcifications in the 6:00 position of the left breast. There was an adjacent partially obscured 1.2 cm mass. On physical exam there was no palpable mass in the left breast. By ultrasonography, there was an oval mass at the 6:00 position measuring 1.4 cm, with a second mass measuring 1.1 cm. There were no abnormal lymph nodes noted in the left axilla.  Biopsy of the 6:00 mass in the left breast January 2015 showed ([id number]) and invasive ductal carcinoma, grade 3, estrogen receptor 97% positive, progesterone receptor 75% positive, with strong staining intensity, and MIB-144%. HER-2 was amplified, with the signals ratio 4.18 and in number per cell of 9.40 .  On 06/08/2013 the patient underwent bilateral breast MRI which showed a dumbbell-shaped area in the central left breast consisting of a 2 cm mass and 2 extensions or an adjacent masses, measuring a total of 2.9 cm. There were no abnormal appearing lymph nodes for any other areas of concern.  The patient's subsequent history is as detailed below.   INTERVAL HISTORY: Lori Vincent returns today for follow up of her breast cancer. She is due for her final trastuzumab dose today. She has been on tamoxifen since December 2015 and is tolerating the drug relatively well. Her hot flashes have been minimal on  gabapentin and she has been using vagifem suppositories with suggest in lubrication. She had some pelvic discomfort, and a transvaginal ultrasound was normal besides some mild uterine thickening. A biopsy was performed and returned negative. Her GYN suggests a uterine ablation or at least a d&c.   REVIEW OF SYSTEMS: Lori Vincent denies fevers, chills, nausea, vomiting, or changes in bowel habits. She has noticed some stress urinary incontinence when she coughs. Her appetite is healthy. She denies shortness of breath, chest pain, cough, or palpitations. She has diffuse joint aches, but this is a chronic issue. She works with rehab twice a week to regain her strength. Since chemotherapy she has had leg weakness that makes it difficult to stand up without pushing off of something. She is weaning herself off of her celexa and is down to 86m daily. She only uses ativan rarely for anxiety. A detailed review of systems is otherwise stable.   PAST MEDICAL HISTORY: Past Medical History  Diagnosis Date  . Breast cancer 06/03/13    left, 6:00 o'clock  . Depression   . Hypothyroidism   . Hypokalemia   . Anemia due to chemotherapy   . History of radiation therapy 01/18/14- 03/02/14    left breast 4680 cGy 26 sessions, boost of 1000 cGy 5 sessions    PAST SURGICAL HISTORY: Past Surgical History  Procedure Laterality Date  . Tonsillectomy  1972  . Cesarean section  04/1999  . Breast biopsy  1/14    lt  . Dilation and curettage of uterus    . Colonoscopy  2013  . Breast lumpectomy with needle localization and axillary sentinel lymph node  bx Left 07/26/2013    Procedure: BREAST LUMPECTOMY WITH NEEDLE LOCALIZATION AND AXILLARY SENTINEL LYMPH NODE BX;  Surgeon: Shann Medal, MD;  Location: [facility];  Service: General;  Laterality: Left;  Marland Kitchen Mole removal Right 07/26/2013    Procedure: MOLE REMOVAL UNDER RIGHT BREAST;  Surgeon: Shann Medal, MD;  Location: [facility];  Service:  General;  Laterality: Right;  . Portacath placement Right 07/26/2013    Procedure: INSERTION PORT-A-CATH;  Surgeon: Shann Medal, MD;  Location: [facility];  Service: General;  Laterality: Right;    FAMILY HISTORY Family History  Problem Relation Age of Onset  . Stroke Mother   . Diabetes Father   . Cancer Maternal Grandmother 80    colon   the patient's father died at the age of 34 in a car accident. The patient's mother died at the age of 69 from a stroke. The patient has one adopted brother. She has one biological sister. The patient's mother's mother was diagnosed with colon cancer at the age of 48. There is no history of breast oropharynx cancer in the family to the patient's knowledge.  She asked about her daughter needing testing for brca and i said no.  GYNECOLOGIC HISTORY:   (Reviewed 11/15/2013) Menarche age 89. First live birth age 82. The patient is GX P1. She took birth control pills for approximately 12 years remotely. She had a period in February of 2014 and then in December of 2014. She had been started on hormone replacement in December. That has since been discontinued.  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 Her husband died at [place] 3 years ago with soft tissue sarcoma. The patient's significant other also works as a Geophysicist/field seismologist. At home it is just the patient and her daughter 67, 87 years old, who just finished middle school. The patient has 2 stepchildren from her earlier marriage. 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   ADVANCED DIRECTIVES: Not in place   HEALTH MAINTENANCE:  (Updated 11/15/2013) History  Substance Use Topics  . Smoking status: Never Smoker   . Smokeless tobacco: Never Used  . Alcohol Use: Yes     Comment: 5-7 glasses per week of red wine      Colonoscopy: July 2014  PAP: November 2014  Bone density: November 2014  Lipid panel: Not on file  Allergies  Allergen Reactions  . Compazine [Prochlorperazine] Other (See Comments)    Extreme restlessness and mild extrapyramidal movement;  Patient tolerates Reglan with no problems    Current Outpatient Prescriptions  Medication Sig Dispense Refill  . citalopram (CELEXA) 20 MG tablet Take 1 tablet (20 mg total) by mouth daily. (Patient taking differently: Take 20 mg by mouth daily. Trying to wean off medication, taking 10 mg daily now.) 30 tablet 1  . Estradiol 10 MCG TABS vaginal tablet Place 1 tablet (10 mcg total) vaginally 3 (three) times a week. (Patient taking differently: Place 1 tablet vaginally 2 (two) times a week. ) 24 tablet 12  . gabapentin (NEURONTIN) 100 MG capsule Take 1 capsule (100 mg total) by mouth 3 (three) times daily. 90 capsule 4  . gabapentin (NEURONTIN) 300 MG capsule Take 1 capsule (300 mg total) by mouth 3 (three) times daily. (Patient taking differently: Take 300 mg by mouth at  bedtime. ) 30 capsule 2  . levothyroxine (SYNTHROID, LEVOTHROID) 112 MCG tablet Take 112 mcg by mouth daily before breakfast. Take sat and sunday    . lidocaine-prilocaine (EMLA) cream Apply 1 application topically as needed. 1-2 hr before each port access 30 g 5  . SYNTHROID 125 MCG tablet Take 125 mcg by mouth. Monday through friday  0  . tamoxifen (NOLVADEX) 20 MG tablet Take 1 tablet (20 mg total) by mouth daily. 90 tablet 4  . LORazepam (ATIVAN) 0.5 MG tablet Take 1 tablet (0.5 mg total) by mouth 2 (two) times daily as needed for anxiety. 60 tablet 0   No current facility-administered medications for this visit.   Facility-Administered Medications Ordered in Other Visits  Medication Dose Route Frequency Provider Last Rate Last Dose  . sodium chloride 0.9 % injection 10 mL  10 mL Intracatheter PRN Chauncey Cruel, MD   10 mL at 08/08/14 1134    OBJECTIVE: Middle-aged  white woman who appears stated age 78 Vitals:   08/08/14 0931  BP: 137/68  Pulse: 74  Temp: 98 F (36.7 C)  Resp: 18   Body mass index is 32.7 kg/(m^2).    ECOG FS: 1 Filed Weights   08/08/14 0931  Weight: 215 lb (97.523 kg)    Skin: warm, dry  HEENT: sclerae anicteric, conjunctivae pink, oropharynx clear. No thrush or mucositis.  Lymph Nodes: No cervical or supraclavicular lymphadenopathy  Lungs: clear to auscultation bilaterally, no rales, wheezes, or rhonci  Heart: regular rate and rhythm  Abdomen: round, soft, non tender, positive bowel sounds  Musculoskeletal: No focal spinal tenderness, no peripheral edema  Neuro: non focal, well oriented, positive affect  Breasts: left breast status post lumpectomy and radiation. No evidence of recurrent disease. Post radiation changes noted to the lower breast. Left axilla benign. Right breast unremarkable.   LAB RESULTS: CBC    Component Value Date/Time   WBC 5.2 08/08/2014 0918   RBC 4.24 08/08/2014 0918   HGB 12.4 08/08/2014 0918   HCT 38.8 08/08/2014 0918   PLT 183 08/08/2014 0918   MCV 91.5 08/08/2014 0918   MCH 29.2 08/08/2014 0918   MCHC 32.0 08/08/2014 0918   RDW 14.4 08/08/2014 0918   LYMPHSABS 1.3 08/08/2014 0918   MONOABS 0.5 08/08/2014 0918   EOSABS 0.2 08/08/2014 0918   BASOSABS 0.0 08/08/2014 0918    CMP     Component Value Date/Time   NA 143 08/08/2014 0918   K 3.6 08/08/2014 0918   CO2 29 08/08/2014 0918   GLUCOSE 114 08/08/2014 0918   BUN 18.9 08/08/2014 0918   CREATININE 0.8 08/08/2014 0918   CALCIUM 9.4 08/08/2014 0918   PROT 6.6 08/08/2014 0918   ALBUMIN 3.6 08/08/2014 0918   AST 17 08/08/2014 0918   ALT 20 08/08/2014 0918   ALKPHOS 83 08/08/2014 0918   BILITOT 0.30 08/08/2014 0918    STUDIES: Most recent echocardiogram on 06/20/14 showed an EF of 60-65%  ASSESSMENT: 59 y.o. [place] woman, status post left breast biopsy 06/03/2013 for a clinical T2 N0, stage IIA invasive ductal  carcinoma, grade 3, estrogen and progesterone receptor positive, with an MIB-1 of 44%, and HER-2 amplified.  (1) status post left lumpectomy and sentinel lymph node sampling 07/26/2013 for a pT2 pN0, stage IIA invasive ductal carcinoma, grade 3, with negative margins.  (2) treated in the adjuvant setting with docetaxel/carboplatin (AUC 5) given along with trastuzumab/pertuzumab x6 cycles, first dose on 09/02/2013, last dose 12/16/2013  (3) trastuzumab continued  through through March 2016  (4) adjuvant radiation completed 03/02/2014  (5) tamoxifen started 04/26/2014  (6)  Depression/anxiety - on Celexa, 20 mg, now down to 49m daily  (7) chemotherapy-induced anemia   (8) vaginal dryness/dyspareunia   PLAN: ASuezettelooks and feels great today. The labs were reviewed in detail and were entirely normal. She will proceed with her last dose of trastzumab today as planned. She continues on tamoxifen daily and is doing well. We discussed her choices between antiestrogen therapies, and again because of her history of joint pain, she wold prefer not to switch to an aromatase inhibitor despite the small increased risk of uterine cancer with the tamoxifen.  Her uterine biopsy was negative, but given the thickness she has agreed to have an ablation procedure performed.   ANaylenewill return in 3 months for labs and a follow up visit. She understands and agrees with this plans. She knows the goal of treatment in her case is cure. She has been encouraged to call with any issues that might arise before her next visit here.  HLaurie Panda NP   08/08/2014 1:24 PM

## 2014-08-08 NOTE — Patient Instructions (Signed)
[facility] Discharge Instructions for Patients Receiving Chemotherapy  Today you received the following chemotherapy agents Herceptin.  To help prevent nausea and vomiting after your treatment, we encourage you to take your nausea medication as directed.   If you develop nausea and vomiting that is not controlled by your nausea medication, call the clinic.   BELOW ARE SYMPTOMS THAT SHOULD BE REPORTED IMMEDIATELY:  *FEVER GREATER THAN 100.5 F  *CHILLS WITH OR WITHOUT FEVER  NAUSEA AND VOMITING THAT IS NOT CONTROLLED WITH YOUR NAUSEA MEDICATION  *UNUSUAL SHORTNESS OF BREATH  *UNUSUAL BRUISING OR BLEEDING  TENDERNESS IN MOUTH AND THROAT WITH OR WITHOUT PRESENCE OF ULCERS  *URINARY PROBLEMS  *BOWEL PROBLEMS  UNUSUAL RASH Items with * indicate a potential emergency and should be followed up as soon as possible.  Feel free to call the clinic you have any questions or concerns. The clinic phone number is [phone].  

## 2014-08-08 NOTE — Telephone Encounter (Signed)
per GM to move to HB-pt aware

## 2014-08-09 ENCOUNTER — Telehealth: Payer: Self-pay | Admitting: *Deleted

## 2014-08-09 NOTE — Telephone Encounter (Signed)
Lori Vincent called to say she had her last Herceptin yesterday, but she forgot to ask about having her Port a Cath removed.  She would very much like to have that done, if an order could please be sent to Dr. Lucia Gaskins at [facility].

## 2014-08-09 NOTE — Telephone Encounter (Signed)
inbox message sent to Dr Lucia Gaskins for port removal  per pt completing IV therapy.

## 2014-08-17 ENCOUNTER — Telehealth: Payer: Self-pay | Admitting: *Deleted

## 2014-08-17 NOTE — H&P (Signed)
  59 year old G 2 P 1 on tamoxifen with thickened endometrium. Ultrasound 7.4 mm  Past Medical History  Diagnosis Date  . Breast cancer 06/03/13    left, 6:00 o'clock  . Depression   . Hypothyroidism   . Hypokalemia   . Anemia due to chemotherapy   . History of radiation therapy 01/18/14- 03/02/14    left breast 4680 cGy 26 sessions, boost of 1000 cGy 5 sessions   Past Surgical History  Procedure Laterality Date  . Tonsillectomy  1972  . Cesarean section  04/1999  . Breast biopsy  1/14    lt  . Dilation and curettage of uterus    . Colonoscopy  2013  . Breast lumpectomy with needle localization and axillary sentinel lymph node bx Left 07/26/2013    Procedure: BREAST LUMPECTOMY WITH NEEDLE LOCALIZATION AND AXILLARY SENTINEL LYMPH NODE BX;  Surgeon: Shann Medal, MD;  Location: [place];  Service: General;  Laterality: Left;  Marland Kitchen Mole removal Right 07/26/2013    Procedure: MOLE REMOVAL UNDER RIGHT BREAST;  Surgeon: Shann Medal, MD;  Location: [place];  Service: General;  Laterality: Right;  . Portacath placement Right 07/26/2013    Procedure: INSERTION PORT-A-CATH;  Surgeon: Shann Medal, MD;  Location: [place];  Service: General;  Laterality: Right;   Compazine No results found for this or any previous visit (from the past 24 hour(s)). Family History  Problem Relation Age of Onset  . Stroke Mother   . Diabetes Father   . Cancer Maternal Grandmother 74    colon   History   Social History  . Marital Status: Widowed    Spouse Name: N/A  . Number of Children: N/A  . Years of Education: N/A   Social History Main Topics  . Smoking status: Never Smoker   . Smokeless tobacco: Never Used  . Alcohol Use: Yes     Comment: 5-7 glasses per week of red wine  . Drug Use: No  . Sexual Activity: Not Currently     Comment: menarche age 33, 22st live birth 7, P28, HRT x 1 month   Other Topics Concern  . Not on file   Social History  Narrative   General alert and oriented Lung CTAB Car RRR Abdomen is soft and non tender Pelvic small uterus, cervical stenosis and above  IMPRESSION: Thickened endometrium with patient currently on tamoxifen  PLAN   D and C, Hysteroscopy, HTA Ablation

## 2014-08-17 NOTE — Telephone Encounter (Signed)
Left message on patient's cell phone to ask how she is taking Celexa and how/if it should be refilled.  EMR indicates she is weaning off the drug.

## 2014-08-18 ENCOUNTER — Other Ambulatory Visit: Payer: Self-pay | Admitting: *Deleted

## 2014-08-18 DIAGNOSIS — F32A Depression, unspecified: Secondary | ICD-10-CM

## 2014-08-18 DIAGNOSIS — F329 Major depressive disorder, single episode, unspecified: Secondary | ICD-10-CM

## 2014-08-18 DIAGNOSIS — F419 Anxiety disorder, unspecified: Secondary | ICD-10-CM

## 2014-08-18 MED ORDER — CITALOPRAM HYDROBROMIDE 20 MG PO TABS: 20.0000 mg | ORAL_TABLET | Freq: Every day | ORAL | Status: DC

## 2014-08-18 NOTE — Telephone Encounter (Signed)
Contacted patient to see if she needed this refill and how the weaning is going.  She is still trying to wean off and can break the tabs in half.  Will refill for one 30 day supply.

## 2014-08-26 ENCOUNTER — Other Ambulatory Visit: Payer: Self-pay | Admitting: *Deleted

## 2014-08-29 NOTE — Patient Instructions (Addendum)
   Your procedure is scheduled on:  Friday, April 8  Enter through the [facility] at: 6 AM Pick up the phone at the desk and dial [phone] and inform us of your arrival.  Please call this number if you have any problems the morning of surgery: [phone]  Remember: Do not eat or drink after midnight: Thursday Take these medicines the morning of surgery with a SIP OF WATER:  Celexa, neurontin, synthroid, tamoxifen, ativan if needed.  Do not wear jewelry, make-up, or FINGER nail polish No metal in your hair or on your body. Do not wear lotions, powders, perfumes.  You may wear deodorant.  Do not bring valuables to the hospital. Contacts, dentures or bridgework may not be worn into surgery.  Patients discharged on the day of surgery will not be allowed to drive home.  Home with friend [name] cell [phone]

## 2014-08-30 ENCOUNTER — Encounter (HOSPITAL_COMMUNITY): Payer: Self-pay

## 2014-08-30 ENCOUNTER — Encounter (HOSPITAL_COMMUNITY)
Admission: RE | Admit: 2014-08-30 | Discharge: 2014-08-30 | Disposition: A | Discharge status: Home / Self Care | Payer: BLUE CROSS/BLUE SHIELD | Source: Ambulatory Visit | Attending: Obstetrics and Gynecology | Admitting: Obstetrics and Gynecology

## 2014-08-30 DIAGNOSIS — F329 Major depressive disorder, single episode, unspecified: Secondary | ICD-10-CM | POA: Diagnosis not present

## 2014-08-30 DIAGNOSIS — K219 Gastro-esophageal reflux disease without esophagitis: Secondary | ICD-10-CM | POA: Diagnosis not present

## 2014-08-30 DIAGNOSIS — Z6833 Body mass index (BMI) 33.0-33.9, adult: Secondary | ICD-10-CM | POA: Diagnosis not present

## 2014-08-30 DIAGNOSIS — R938 Abnormal findings on diagnostic imaging of other specified body structures: Secondary | ICD-10-CM | POA: Diagnosis not present

## 2014-08-30 DIAGNOSIS — Z923 Personal history of irradiation: Secondary | ICD-10-CM | POA: Diagnosis not present

## 2014-08-30 DIAGNOSIS — Z853 Personal history of malignant neoplasm of breast: Secondary | ICD-10-CM | POA: Diagnosis not present

## 2014-08-30 DIAGNOSIS — E669 Obesity, unspecified: Secondary | ICD-10-CM | POA: Diagnosis not present

## 2014-08-30 DIAGNOSIS — E039 Hypothyroidism, unspecified: Secondary | ICD-10-CM | POA: Diagnosis not present

## 2014-08-30 DIAGNOSIS — M1990 Unspecified osteoarthritis, unspecified site: Secondary | ICD-10-CM | POA: Diagnosis not present

## 2014-08-30 DIAGNOSIS — F419 Anxiety disorder, unspecified: Secondary | ICD-10-CM | POA: Diagnosis not present

## 2014-08-30 HISTORY — DX: Unspecified osteoarthritis, unspecified site: M19.90

## 2014-08-30 HISTORY — DX: Personal history of other medical treatment: Z92.89

## 2014-08-30 HISTORY — DX: Anxiety disorder, unspecified: F41.9

## 2014-08-30 LAB — CBC
HCT: 35.1 % — ABNORMAL LOW (ref 36.0–46.0)
HEMOGLOBIN: 11.4 g/dL — AB (ref 12.0–15.0)
MCH: 29.5 pg (ref 26.0–34.0)
MCHC: 32.5 g/dL (ref 30.0–36.0)
MCV: 90.9 fL (ref 78.0–100.0)
Platelets: 160 10*3/uL (ref 150–400)
RBC: 3.86 MIL/uL — AB (ref 3.87–5.11)
RDW: 14.4 % (ref 11.5–15.5)
WBC: 4.5 10*3/uL (ref 4.0–10.5)

## 2014-08-30 LAB — BASIC METABOLIC PANEL
Anion gap: 8 (ref 5–15)
BUN: 17 mg/dL (ref 6–23)
CALCIUM: 8.8 mg/dL (ref 8.4–10.5)
CO2: 26 mmol/L (ref 19–32)
CREATININE: 0.82 mg/dL (ref 0.50–1.10)
Chloride: 106 mmol/L (ref 96–112)
GFR calc Af Amer: >90 mL/min (ref >90)
GFR, EST NON AFRICAN AMERICAN: 79 mL/min — AB (ref >90)
Glucose, Bld: 102 mg/dL — ABNORMAL HIGH (ref 70–99)
Potassium: 3.6 mmol/L (ref 3.5–5.1)
SODIUM: 140 mmol/L (ref 135–145)

## 2014-09-01 NOTE — Anesthesia Preprocedure Evaluation (Signed)
Anesthesia Evaluation  Patient identified by MRN, date of birth, ID band Patient awake    Reviewed: Allergy & Precautions, NPO status , Patient's Chart, lab work & pertinent test results  History of Anesthesia Complications Negative for: history of anesthetic complications  Airway Mallampati: II  TM Distance: >3 FB Neck ROM: Full    Dental no notable dental hx. (+) Dental Advisory Given   Pulmonary neg pulmonary ROS,  breath sounds clear to auscultation  Pulmonary exam normal       Cardiovascular negative cardio ROS  Rhythm:Regular Rate:Normal     Neuro/Psych PSYCHIATRIC DISORDERS Anxiety Depression negative neurological ROS     GI/Hepatic Neg liver ROS, GERD-  Medicated and Controlled,  Endo/Other  Hypothyroidism obesity  Renal/GU negative Renal ROS  negative genitourinary   Musculoskeletal  (+) Arthritis -,   Abdominal   Peds negative pediatric ROS (+)  Hematology  (+) anemia ,   Anesthesia Other Findings Hx of breast ca  Reproductive/Obstetrics negative OB ROS                             Anesthesia Physical Anesthesia Plan  ASA: II  Anesthesia Plan: General   Post-op Pain Management:    Induction: Intravenous  Airway Management Planned:   Additional Equipment:   Intra-op Plan:   Post-operative Plan:   Informed Consent: I have reviewed the patients History and Physical, chart, labs and discussed the procedure including the risks, benefits and alternatives for the proposed anesthesia with the patient or authorized representative who has indicated his/her understanding and acceptance.   Dental advisory given  Plan Discussed with: CRNA  Anesthesia Plan Comments:         Anesthesia Quick Evaluation

## 2014-09-02 ENCOUNTER — Encounter (HOSPITAL_COMMUNITY)
Admission: RE | Disposition: A | Discharge status: Home / Self Care | Payer: Self-pay | Source: Ambulatory Visit | Attending: Obstetrics and Gynecology

## 2014-09-02 ENCOUNTER — Ambulatory Visit (HOSPITAL_COMMUNITY): Payer: BLUE CROSS/BLUE SHIELD | Admitting: Anesthesiology

## 2014-09-02 ENCOUNTER — Ambulatory Visit (HOSPITAL_COMMUNITY)
Admission: RE | Admit: 2014-09-02 | Discharge: 2014-09-02 | Disposition: A | Discharge status: Home / Self Care | Payer: BLUE CROSS/BLUE SHIELD | Source: Ambulatory Visit | Attending: Obstetrics and Gynecology | Admitting: Obstetrics and Gynecology

## 2014-09-02 ENCOUNTER — Encounter (HOSPITAL_COMMUNITY): Payer: Self-pay | Admitting: Registered Nurse

## 2014-09-02 DIAGNOSIS — F419 Anxiety disorder, unspecified: Secondary | ICD-10-CM | POA: Insufficient documentation

## 2014-09-02 DIAGNOSIS — F329 Major depressive disorder, single episode, unspecified: Secondary | ICD-10-CM | POA: Insufficient documentation

## 2014-09-02 DIAGNOSIS — M1990 Unspecified osteoarthritis, unspecified site: Secondary | ICD-10-CM | POA: Insufficient documentation

## 2014-09-02 DIAGNOSIS — E039 Hypothyroidism, unspecified: Secondary | ICD-10-CM | POA: Insufficient documentation

## 2014-09-02 DIAGNOSIS — K219 Gastro-esophageal reflux disease without esophagitis: Secondary | ICD-10-CM | POA: Insufficient documentation

## 2014-09-02 DIAGNOSIS — Z923 Personal history of irradiation: Secondary | ICD-10-CM | POA: Insufficient documentation

## 2014-09-02 DIAGNOSIS — E669 Obesity, unspecified: Secondary | ICD-10-CM | POA: Insufficient documentation

## 2014-09-02 DIAGNOSIS — Z853 Personal history of malignant neoplasm of breast: Secondary | ICD-10-CM | POA: Insufficient documentation

## 2014-09-02 DIAGNOSIS — R938 Abnormal findings on diagnostic imaging of other specified body structures: Secondary | ICD-10-CM | POA: Diagnosis not present

## 2014-09-02 DIAGNOSIS — Z6833 Body mass index (BMI) 33.0-33.9, adult: Secondary | ICD-10-CM | POA: Insufficient documentation

## 2014-09-02 HISTORY — PX: DILITATION & CURRETTAGE/HYSTROSCOPY WITH HYDROTHERMAL ABLATION: SHX5570

## 2014-09-02 SURGERY — DILATATION & CURETTAGE/HYSTEROSCOPY WITH HYDROTHERMAL ABLATION
Anesthesia: General | Site: Vagina

## 2014-09-02 MED ORDER — PROPOFOL 10 MG/ML IV BOLUS
INTRAVENOUS | Status: DC | PRN
  Administered 2014-09-02: 200 mg via INTRAVENOUS

## 2014-09-02 MED ORDER — SODIUM CHLORIDE 0.9 % IR SOLN
Status: DC | PRN
  Administered 2014-09-02: 1800 mL

## 2014-09-02 MED ORDER — DEXAMETHASONE SODIUM PHOSPHATE 4 MG/ML IJ SOLN
INTRAMUSCULAR | Status: AC
  Filled 2014-09-02: qty 1

## 2014-09-02 MED ORDER — ONDANSETRON HCL 4 MG/2ML IJ SOLN: 4.0000 mg | Freq: Once | INTRAMUSCULAR | Status: DC | PRN

## 2014-09-02 MED ORDER — SCOPOLAMINE 1 MG/3DAYS TD PT72
1.0000 | MEDICATED_PATCH | Freq: Once | TRANSDERMAL | Status: DC
Start: 2014-09-02 — End: 2014-09-02
  Administered 2014-09-02: 1.5 mg via TRANSDERMAL

## 2014-09-02 MED ORDER — MIDAZOLAM HCL 5 MG/5ML IJ SOLN
INTRAMUSCULAR | Status: DC | PRN
  Administered 2014-09-02: 2 mg via INTRAVENOUS

## 2014-09-02 MED ORDER — MONSELS FERRIC SUBSULFATE EX SOLN
CUTANEOUS | Status: DC | PRN
  Administered 2014-09-02: 1 via TOPICAL

## 2014-09-02 MED ORDER — OXYCODONE-ACETAMINOPHEN 5-325 MG PO TABS
1.0000 | ORAL_TABLET | Freq: Once | ORAL | Status: AC
  Administered 2014-09-02: 1 via ORAL

## 2014-09-02 MED ORDER — KETOROLAC TROMETHAMINE 30 MG/ML IJ SOLN
INTRAMUSCULAR | Status: DC | PRN
  Administered 2014-09-02: 30 mg via INTRAVENOUS

## 2014-09-02 MED ORDER — SCOPOLAMINE 1 MG/3DAYS TD PT72
MEDICATED_PATCH | TRANSDERMAL | Status: AC
  Administered 2014-09-02: 1.5 mg via TRANSDERMAL
  Filled 2014-09-02: qty 1

## 2014-09-02 MED ORDER — FENTANYL CITRATE 0.05 MG/ML IJ SOLN
25.0000 ug | INTRAMUSCULAR | Status: DC | PRN
  Administered 2014-09-02: 25 ug via INTRAVENOUS
  Administered 2014-09-02: 100 ug via INTRAVENOUS
  Administered 2014-09-02: 50 ug via INTRAVENOUS

## 2014-09-02 MED ORDER — FENTANYL CITRATE 0.05 MG/ML IJ SOLN
INTRAMUSCULAR | Status: DC | PRN
  Administered 2014-09-02 (×2): 50 ug via INTRAVENOUS
  Administered 2014-09-02: 25 ug via INTRAVENOUS
  Administered 2014-09-02: 50 ug via INTRAVENOUS
  Administered 2014-09-02: 25 ug via INTRAVENOUS

## 2014-09-02 MED ORDER — CEFAZOLIN SODIUM-DEXTROSE 2-3 GM-% IV SOLR
INTRAVENOUS | Status: AC
  Filled 2014-09-02: qty 50

## 2014-09-02 MED ORDER — ONDANSETRON HCL 4 MG/2ML IJ SOLN
INTRAMUSCULAR | Status: DC | PRN
  Administered 2014-09-02: 4 mg via INTRAVENOUS

## 2014-09-02 MED ORDER — FENTANYL CITRATE 0.05 MG/ML IJ SOLN
INTRAMUSCULAR | Status: DC
Start: 2014-09-02 — End: 2014-09-02
  Filled 2014-09-02: qty 2

## 2014-09-02 MED ORDER — LACTATED RINGERS IV SOLN
INTRAVENOUS | Status: DC
Start: 2014-09-02 — End: 2014-09-02
  Administered 2014-09-02 (×2): via INTRAVENOUS

## 2014-09-02 MED ORDER — FERRIC SUBSULFATE 259 MG/GM EX SOLN
CUTANEOUS | Status: AC
  Filled 2014-09-02: qty 8

## 2014-09-02 MED ORDER — LIDOCAINE HCL (CARDIAC) 20 MG/ML IV SOLN
INTRAVENOUS | Status: DC | PRN
  Administered 2014-09-02: 50 mg via INTRAVENOUS

## 2014-09-02 MED ORDER — MIDAZOLAM HCL 2 MG/2ML IJ SOLN
INTRAMUSCULAR | Status: AC
  Filled 2014-09-02: qty 2

## 2014-09-02 MED ORDER — DEXAMETHASONE SODIUM PHOSPHATE 4 MG/ML IJ SOLN
INTRAMUSCULAR | Status: DC | PRN
  Administered 2014-09-02: 4 mg via INTRAVENOUS

## 2014-09-02 MED ORDER — CEFAZOLIN SODIUM-DEXTROSE 2-3 GM-% IV SOLR
2.0000 g | INTRAVENOUS | Status: AC
  Administered 2014-09-02: 2 g via INTRAVENOUS

## 2014-09-02 MED ORDER — OXYCODONE-ACETAMINOPHEN 5-325 MG PO TABS
ORAL_TABLET | ORAL | Status: AC
  Filled 2014-09-02: qty 1

## 2014-09-02 MED ORDER — KETOROLAC TROMETHAMINE 30 MG/ML IJ SOLN
INTRAMUSCULAR | Status: AC
  Filled 2014-09-02: qty 1

## 2014-09-02 MED ORDER — FENTANYL CITRATE 0.05 MG/ML IJ SOLN
INTRAMUSCULAR | Status: AC
  Filled 2014-09-02: qty 2

## 2014-09-02 MED ORDER — LIDOCAINE HCL 1 % IJ SOLN
INTRAMUSCULAR | Status: AC
  Filled 2014-09-02: qty 20

## 2014-09-02 MED ORDER — ONDANSETRON HCL 4 MG/2ML IJ SOLN
INTRAMUSCULAR | Status: AC
  Filled 2014-09-02: qty 2

## 2014-09-02 MED ORDER — LACTATED RINGERS IV SOLN
INTRAVENOUS | Status: DC
Start: 2014-09-02 — End: 2014-09-02

## 2014-09-02 MED ORDER — PROPOFOL 10 MG/ML IV BOLUS
INTRAVENOUS | Status: AC
  Filled 2014-09-02: qty 20

## 2014-09-02 MED ORDER — LIDOCAINE HCL 1 % IJ SOLN
INTRAMUSCULAR | Status: DC | PRN
  Administered 2014-09-02: 10 mL

## 2014-09-02 MED ORDER — LIDOCAINE HCL (CARDIAC) 20 MG/ML IV SOLN
INTRAVENOUS | Status: AC
  Filled 2014-09-02: qty 5

## 2014-09-02 SURGICAL SUPPLY — 16 items
CANISTER SUCT 3000ML (MISCELLANEOUS) ×2 IMPLANT
CATH ROBINSON RED A/P 16FR (CATHETERS) ×2 IMPLANT
CLOTH BEACON ORANGE TIMEOUT ST (SAFETY) ×2 IMPLANT
CONTAINER PREFILL 10% NBF 60ML (FORM) ×4 IMPLANT
ELECT REM PT RETURN 9FT ADLT (ELECTROSURGICAL) ×2
ELECTRODE REM PT RTRN 9FT ADLT (ELECTROSURGICAL) ×1 IMPLANT
GLOVE BIO SURGEON STRL SZ 6.5 (GLOVE) ×2 IMPLANT
GOWN STRL REUS W/TWL LRG LVL3 (GOWN DISPOSABLE) ×4 IMPLANT
LOOP ANGLED CUTTING 22FR (CUTTING LOOP) IMPLANT
PACK VAGINAL MINOR WOMEN LF (CUSTOM PROCEDURE TRAY) ×2 IMPLANT
PAD OB MATERNITY 4.3X12.25 (PERSONAL CARE ITEMS) ×2 IMPLANT
SCOPETTES 8  STERILE (MISCELLANEOUS) ×1
SCOPETTES 8 STERILE (MISCELLANEOUS) ×1 IMPLANT
SET GENESYS HTA PROCERVA (MISCELLANEOUS) ×2 IMPLANT
TOWEL OR 17X24 6PK STRL BLUE (TOWEL DISPOSABLE) ×4 IMPLANT
WATER STERILE IRR 1000ML POUR (IV SOLUTION) ×2 IMPLANT

## 2014-09-02 NOTE — Progress Notes (Signed)
H and P on the chart No significant changes Will proceed with D and C, HYS, HTA Consent signed

## 2014-09-02 NOTE — Transfer of Care (Signed)
Immediate Anesthesia Transfer of Care Note  Patient: Lori Vincent  Procedure(s) Performed: Procedure(s): DILATATION & CURETTAGE/HYSTEROSCOPY WITH HYDROTHERMAL ABLATION (N/A)  Patient Location: PACU  Anesthesia Type:General  Level of Consciousness: awake, alert  and oriented  Airway & Oxygen Therapy: Patient Spontanous Breathing and Patient connected to nasal cannula oxygen  Post-op Assessment: Report given to RN  Post vital signs: Reviewed  Last Vitals:  Filed Vitals:   09/02/14 0611  BP: 125/70  Pulse: 85  Temp: 36.7 C  Resp: 20    Complications: No apparent anesthesia complications

## 2014-09-02 NOTE — Brief Op Note (Signed)
09/02/2014  8:17 AM  PATIENT:  Lori Vincent  59 y.o. female  PRE-OPERATIVE DIAGNOSIS:  thickened EM  POST-OPERATIVE DIAGNOSIS:  thickened EM  PROCEDURE:  Procedure(s): DILATATION & CURETTAGE/HYSTEROSCOPY WITH HYDROTHERMAL ABLATION (N/A)  SURGEON:  Surgeon(s) and Role:    * Dian Queen, MD - Primary  PHYSICIAN ASSISTANT:   ASSISTANTS: none   ANESTHESIA:   IV sedation and paracervical block  EBL:  Total I/O In: 1000 [I.V.:1000] Out: 10 [Urine:10]  BLOOD ADMINISTERED:none  DRAINS: none   LOCAL MEDICATIONS USED:  LIDOCAINE   SPECIMEN:  Source of Specimen:  uterine currettings  DISPOSITION OF SPECIMEN:  PATHOLOGY  COUNTS:  YES  TOURNIQUET:  * No tourniquets in log *  DICTATION: .Other Dictation: Dictation Number [id number]  PLAN OF CARE: Discharge to home after PACU  PATIENT DISPOSITION:  PACU - hemodynamically stable.   Delay start of Pharmacological VTE agent (>24hrs) due to surgical blood loss or risk of bleeding: not applicable

## 2014-09-02 NOTE — Anesthesia Postprocedure Evaluation (Signed)
  Anesthesia Post-op Note  Patient: Lori Vincent  Procedure(s) Performed: Procedure(s) (LRB): DILATATION & CURETTAGE/HYSTEROSCOPY WITH HYDROTHERMAL ABLATION (N/A)  Patient Location: PACU  Anesthesia Type: General  Level of Consciousness: awake and alert   Airway and Oxygen Therapy: Patient Spontanous Breathing  Post-op Pain: mild  Post-op Assessment: Post-op Vital signs reviewed, Patient's Cardiovascular Status Stable, Respiratory Function Stable, Patent Airway and No signs of Nausea or vomiting  Last Vitals:  Filed Vitals:   09/02/14 0818  BP: 104/66  Pulse: 82  Temp: 36.7 C  Resp: 10    Post-op Vital Signs: stable   Complications: No apparent anesthesia complications

## 2014-09-02 NOTE — Discharge Instructions (Signed)
°  Post Anesthesia Home Care Instructions  Activity: Get plenty of rest for the remainder of the day. A responsible adult should stay with you for 24 hours following the procedure.  For the next 24 hours, DO NOT: -Drive a car -Paediatric nurse -Drink alcoholic beverages -Take any medication unless instructed by your physician -Make any legal decisions or sign important papers.  Meals: Start with liquid foods such as gelatin or soup. Progress to regular foods as tolerated. Avoid greasy, spicy, heavy foods. If nausea and/or vomiting occur, drink only clear liquids until the nausea and/or vomiting subsides. Call your physician if vomiting continues.  Special Instructions/Symptoms: Your throat may feel dry or sore from the anesthesia or the breathing tube placed in your throat during surgery. If this causes discomfort, gargle with warm salt water. The discomfort should disappear within 24 hours.  If you had a scopolamine patch placed behind your ear for the management of post- operative nausea and/or vomiting:  1. The medication in the patch is effective for 72 hours, after which it should be removed.  Wrap patch in a tissue and discard in the trash. Wash hands thoroughly with soap and water. 2. You may remove the patch earlier than 72 hours if you experience unpleasant side effects which may include dry mouth, dizziness or visual disturbances. 3. Avoid touching the patch. Wash your hands with soap and water after contact with the patch.   DISCHARGE INSTRUCTIONS: D&C / D&E The following instructions have been prepared to help you care for yourself upon your return home.   Personal hygiene:  Use sanitary pads for vaginal drainage, not tampons.  Shower the day after your procedure.  NO tub baths, pools or Jacuzzis for 2-3 weeks.  Wipe front to back after using the bathroom.  Activity and limitations:  Do NOT drive or operate any equipment for 24 hours. The effects of anesthesia are  still present and drowsiness may result.  Do NOT rest in bed all day.  Walking is encouraged.  Walk up and down stairs slowly.  You may resume your normal activity in one to two days or as indicated by your physician.  Sexual activity: NO intercourse for at least 2 weeks after the procedure, or as indicated by your physician.  Diet: Eat a light meal as desired this evening. You may resume your usual diet tomorrow.  Return to work: You may resume your work activities in one to two days or as indicated by your doctor.  What to expect after your surgery: Expect to have vaginal bleeding/discharge for 2-3 days and spotting for up to 10 days. It is not unusual to have soreness for up to 1-2 weeks. You may have a slight burning sensation when you urinate for the first day. Mild cramps may continue for a couple of days. You may have a regular period in 2-6 weeks.  Call your doctor for any of the following:  Excessive vaginal bleeding, saturating and changing one pad every hour.  Inability to urinate 6 hours after discharge from hospital.  Pain not relieved by pain medication.  Fever of 100.4 F or greater.  Unusual vaginal discharge or odor.   Call for an appointment:    Patients signature: ______________________  Nurses signature ________________________  Support person's signature_______________________   Go to Dr. Christen Butter office after discharge from [facility] to pick up prescription for pain medicine.

## 2014-09-02 NOTE — Anesthesia Procedure Notes (Signed)
Procedure Name: LMA Insertion Date/Time: 09/02/2014 7:31 AM Performed by: Talbot Grumbling Pre-anesthesia Checklist: Patient identified, Emergency Drugs available, Suction available and Patient being monitored Patient Re-evaluated:Patient Re-evaluated prior to inductionOxygen Delivery Method: Circle system utilized Preoxygenation: Pre-oxygenation with 100% oxygen Intubation Type: IV induction Ventilation: Mask ventilation without difficulty LMA: LMA inserted LMA Size: 4.0 Number of attempts: 1 Placement Confirmation: positive ETCO2 and breath sounds checked- equal and bilateral Tube secured with: Tape Dental Injury: Teeth and Oropharynx as per pre-operative assessment

## 2014-09-02 NOTE — Op Note (Signed)
NAMETHERMA, LASURE NO.:  [id number]  MEDICAL RECORD NO.:  [id number]  LOCATION:  [facility]                          FACILITY:  [facility]  PHYSICIAN:  Perri Aragones L. Kenni Newton, M.D.DATE OF BIRTH:  05/19/1956  DATE OF PROCEDURE:  09/02/2014 DATE OF DISCHARGE:                              OPERATIVE REPORT   PREOPERATIVE DIAGNOSES:  Thickened endometrium.  History of breast cancer.  POSTOPERATIVE DIAGNOSES:  Thickened endometrium.  History of breast cancer.  PROCEDURE:  D and C, hysteroscopy, and hydrothermal ablation (HTA).  SURGEON:  Edwinna Rochette L. Helane Rima, M.D.  ANESTHESIA:  IV sedation with paracervical block.  ESTIMATED BLOOD LOSS:  Minimal.  COMPLICATIONS:  None.  DESCRIPTION OF PROCEDURE:  The patient was taken to the operating room where anesthesia was administered.  She was prepped and draped.  A time- out was performed.  Antibiotics had been given.  The speculum was placed in the vagina.  The cervix was grasped with a tenaculum.  A paracervical block was performed.  The cervical internal os was gently dilated.  A sharp curette was inserted.  The uterus was thoroughly curetted.  The HTA with the hysteroscope was already primed and was then inserted into the uterine cavity very easily with excellent visualization.  We did a fluid seal check, and there was no fluid leakage.  We then proceeded with the HTA according to the [facility] specifications for the 10 minutes cycle.  Cooling was performed, and then the hysteroscope was removed.  All instruments were removed from the vagina except for the speculum.  There was a small area of bleeding at one of the tenaculum site.  I then treated that with silver nitrate and Monsel's.  Bleeding was minimal to none.  All instruments were removed from the vagina.  All sponge, lap, and instrument counts were correct x2.  The patient went to the recovery room in stable condition.     Cletus Mehlhoff L. Helane Rima,  M.D.     Nevin Bloodgood  D:  09/02/2014  T:  09/02/2014  Job:  [id number]

## 2014-09-05 ENCOUNTER — Encounter (HOSPITAL_COMMUNITY): Payer: Self-pay | Admitting: Obstetrics and Gynecology

## 2014-10-06 ENCOUNTER — Other Ambulatory Visit: Payer: Self-pay | Admitting: Nurse Practitioner

## 2014-10-07 ENCOUNTER — Telehealth: Payer: Self-pay

## 2014-10-07 ENCOUNTER — Other Ambulatory Visit: Payer: Self-pay | Admitting: *Deleted

## 2014-10-07 NOTE — Telephone Encounter (Signed)
lvm that we received an electronic refill request for gabapentin 300 mg capsules. There are also 100 mg capsules on her med list. Asked her to call back to clarify what refills she needs on her gabapentin.

## 2014-10-10 ENCOUNTER — Other Ambulatory Visit: Payer: Self-pay | Admitting: *Deleted

## 2014-10-10 DIAGNOSIS — R232 Flushing: Secondary | ICD-10-CM

## 2014-10-10 DIAGNOSIS — G620 Drug-induced polyneuropathy: Secondary | ICD-10-CM

## 2014-10-10 DIAGNOSIS — T451X5A Adverse effect of antineoplastic and immunosuppressive drugs, initial encounter: Secondary | ICD-10-CM

## 2014-10-10 MED ORDER — GABAPENTIN 100 MG PO CAPS: 100.0000 mg | ORAL_CAPSULE | Freq: Three times a day (TID) | ORAL | Status: DC

## 2014-10-10 MED ORDER — GABAPENTIN 300 MG PO CAPS: 300.0000 mg | ORAL_CAPSULE | Freq: Three times a day (TID) | ORAL | Status: DC

## 2014-11-10 ENCOUNTER — Ambulatory Visit: Payer: BLUE CROSS/BLUE SHIELD | Admitting: Oncology

## 2014-11-10 ENCOUNTER — Other Ambulatory Visit: Payer: BLUE CROSS/BLUE SHIELD

## 2014-11-10 NOTE — Progress Notes (Signed)
No show

## 2014-11-16 ENCOUNTER — Other Ambulatory Visit: Payer: Self-pay | Admitting: *Deleted

## 2014-11-16 ENCOUNTER — Ambulatory Visit (HOSPITAL_BASED_OUTPATIENT_CLINIC_OR_DEPARTMENT_OTHER): Payer: BLUE CROSS/BLUE SHIELD | Admitting: Nurse Practitioner

## 2014-11-16 ENCOUNTER — Telehealth: Payer: Self-pay | Admitting: *Deleted

## 2014-11-16 ENCOUNTER — Encounter: Payer: Self-pay | Admitting: Nurse Practitioner

## 2014-11-16 ENCOUNTER — Telehealth: Payer: Self-pay | Admitting: Nurse Practitioner

## 2014-11-16 ENCOUNTER — Other Ambulatory Visit (HOSPITAL_BASED_OUTPATIENT_CLINIC_OR_DEPARTMENT_OTHER): Payer: BLUE CROSS/BLUE SHIELD

## 2014-11-16 VITALS — BP 131/66 | HR 84 | Temp 97.5°F | Resp 18 | Ht 68.0 in | Wt 213.3 lb

## 2014-11-16 DIAGNOSIS — Z79810 Long term (current) use of selective estrogen receptor modulators (SERMs): Secondary | ICD-10-CM | POA: Diagnosis not present

## 2014-11-16 DIAGNOSIS — C50512 Malignant neoplasm of lower-outer quadrant of left female breast: Secondary | ICD-10-CM

## 2014-11-16 DIAGNOSIS — Z170 Estrogen receptor positive status [ER+]: Secondary | ICD-10-CM | POA: Diagnosis not present

## 2014-11-16 DIAGNOSIS — C50812 Malignant neoplasm of overlapping sites of left female breast: Secondary | ICD-10-CM | POA: Diagnosis not present

## 2014-11-16 DIAGNOSIS — D6481 Anemia due to antineoplastic chemotherapy: Secondary | ICD-10-CM

## 2014-11-16 LAB — CBC WITH DIFFERENTIAL/PLATELET
BASO%: 0.2 % (ref 0.0–2.0)
BASOS ABS: 0 10*3/uL (ref 0.0–0.1)
EOS%: 2.2 % (ref 0.0–7.0)
Eosinophils Absolute: 0.1 10*3/uL (ref 0.0–0.5)
HCT: 37.3 % (ref 34.8–46.6)
HEMOGLOBIN: 12.2 g/dL (ref 11.6–15.9)
LYMPH#: 1.7 10*3/uL (ref 0.9–3.3)
LYMPH%: 34.8 % (ref 14.0–49.7)
MCH: 29.6 pg (ref 25.1–34.0)
MCHC: 32.7 g/dL (ref 31.5–36.0)
MCV: 90.5 fL (ref 79.5–101.0)
MONO#: 0.4 10*3/uL (ref 0.1–0.9)
MONO%: 7.5 % (ref 0.0–14.0)
NEUT#: 2.7 10*3/uL (ref 1.5–6.5)
NEUT%: 55.3 % (ref 38.4–76.8)
Platelets: 156 10*3/uL (ref 145–400)
RBC: 4.12 10*6/uL (ref 3.70–5.45)
RDW: 14 % (ref 11.2–14.5)
WBC: 4.9 10*3/uL (ref 3.9–10.3)

## 2014-11-16 LAB — COMPREHENSIVE METABOLIC PANEL (CC13)
ALK PHOS: 95 U/L (ref 40–150)
ALT: 22 U/L (ref 0–55)
AST: 20 U/L (ref 5–34)
Albumin: 3.8 g/dL (ref 3.5–5.0)
Anion Gap: 9 mEq/L (ref 3–11)
BUN: 14.7 mg/dL (ref 7.0–26.0)
CALCIUM: 9.2 mg/dL (ref 8.4–10.4)
CHLORIDE: 103 meq/L (ref 98–109)
CO2: 27 mEq/L (ref 22–29)
Creatinine: 0.8 mg/dL (ref 0.6–1.1)
EGFR: 81 mL/min/{1.73_m2} — ABNORMAL LOW (ref >90)
Glucose: 125 mg/dl (ref 70–140)
Potassium: 3.5 mEq/L (ref 3.5–5.1)
Sodium: 139 mEq/L (ref 136–145)
Total Bilirubin: 0.39 mg/dL (ref 0.20–1.20)
Total Protein: 6.5 g/dL (ref 6.4–8.3)

## 2014-11-16 NOTE — Progress Notes (Signed)
[place]  [phone] [phone]     ID: KEAIRRA BARDON DOB: 01-Feb-1956  MR#: [id number]  LLV#:[id number]  PCP: [name], NP GYN:  [name], MD SU: [name], MD OTHER MD: Arloa Koh, MD  CHIEF COMPLAINT: estrogen receptor positive breast cancer CURRENT TREATMENT: tamoxifen daily  BREAST CANCER HISTORY: From the original intake note:  [facility] showing a possible mass in the left breast. Diagnostic left mammography and ultrasonography at the breast center 04/26/2013 confirmed an oval mass with microcalcifications in the 6:00 position of the left breast. There was an adjacent partially obscured 1.2 cm mass. On physical exam there was no palpable mass in the left breast. By ultrasonography, there was an oval mass at the 6:00 position measuring 1.4 cm, with a second mass measuring 1.1 cm. There were no abnormal lymph nodes noted in the left axilla.  Biopsy of the 6:00 mass in the left breast January 2015 showed ([id number]) and invasive ductal carcinoma, grade 3, estrogen receptor 97% positive, progesterone receptor 75% positive, with strong staining intensity, and MIB-144%. HER-2 was amplified, with the signals ratio 4.18 and in number per cell of 9.40 .  On 06/08/2013 the patient underwent bilateral breast MRI which showed a dumbbell-shaped area in the central left breast consisting of a 2 cm mass and 2 extensions or an adjacent masses, measuring a total of 2.9 cm. There were no abnormal appearing lymph nodes for any other areas of concern.  The patient's subsequent history is as detailed below.  INTERVAL HISTORY: Zella returns today for follow up of her breast cancer. She has been on tamoxifen since December 2015 and is tolerating this drug well. She has mild hot flashes, but extreme vaginal dryness and atrophy. She has been participating in the "[employer] Touch"  therapy with her GYN that regenerates the vaginal walls. She recently had a uterine ablation that went well.  REVIEW OF SYSTEMS: Therese denies fevers, chills, nausea, vomiting, or changes in bowel habits. She is being treated with macrobid for 30 days because of recurrent UTIs that were not eradicated with other antibiotic therapies. Her appetite is healthy. She denies shortness of breath, chest pain, cough, or palpitations. She has diffuse joint aches, but this is a chronic issue. She is off of the celxa completely and only uses ativan rarely for anxiety. She is using gabapentin BID for residual neuropathy. A detailed review of systems is otherwise stable.   PAST MEDICAL HISTORY: Past Medical History  Diagnosis Date  . Breast cancer 06/03/13    left, 6:00 o'clock  . Depression   . Hypothyroidism   . Hypokalemia     during with chemo 08/2013  . Anemia due to chemotherapy   . History of radiation therapy 01/18/14- 03/02/14    left breast 4680 cGy 26 sessions, boost of 1000 cGy 5 sessions  . Anxiety   . Arthritis     knees and feet  . History of blood transfusion 12/2013    [facility] - 2 units transfused - during chemo    PAST SURGICAL HISTORY: Past Surgical History  Procedure Laterality Date  . Tonsillectomy  1972  . Cesarean section  04/1999  . Breast biopsy  1/14    lt  . Dilation and curettage of uterus    . Colonoscopy  2013  . Breast lumpectomy with needle localization and axillary sentinel lymph node bx Left 07/26/2013    Procedure: BREAST LUMPECTOMY  WITH NEEDLE LOCALIZATION AND AXILLARY SENTINEL LYMPH NODE BX;  Surgeon: Shann Medal, MD;  Location: [facility];  Service: General;  Laterality: Left;  Marland Kitchen Mole removal Right 07/26/2013    Procedure: MOLE REMOVAL UNDER RIGHT BREAST;  Surgeon: Shann Medal, MD;  Location: [facility];  Service: General;  Laterality: Right;  . Portacath placement Right 07/26/2013    Procedure: INSERTION PORT-A-CATH;  Surgeon: Shann Medal, MD;  Location: [facility];  Service: General;  Laterality: Right;  . Wisdom tooth extraction    . Dilitation & currettage/hystroscopy with hydrothermal ablation N/A 09/02/2014    Procedure: DILATATION & CURETTAGE/HYSTEROSCOPY WITH HYDROTHERMAL ABLATION;  Surgeon: Dian Queen, MD;  Location: [facility];  Service: Gynecology;  Laterality: N/A;    FAMILY HISTORY Family History  Problem Relation Age of Onset  . Stroke Mother   . Diabetes Father   . Cancer Maternal Grandmother 45    colon   the patient's father died at the age of 74 in a car accident. The patient's mother died at the age of 30 from a stroke. The patient has one adopted brother. She has one biological sister. The patient's mother's mother was diagnosed with colon cancer at the age of 67. There is no history of breast oropharynx cancer in the family to the patient's knowledge.  She asked about her daughter needing testing for brca and i said no.  GYNECOLOGIC HISTORY:   (Reviewed 11/15/2013) Menarche age 59. First live birth age 59. The patient is GX P1. She took birth control pills for approximately 12 years remotely. She had a period in February of 2014 and then in December of 2014. She had been started on hormone replacement in December. That has since been discontinued.  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 Her husband died at [place] 3 years ago with soft tissue sarcoma. The patient's significant other also works as a Geophysicist/field seismologist. At home it is just the patient and her daughter 35, 27 years old, who just finished middle school. The patient has 2 stepchildren from her earlier marriage. [name] is a Games developer in [place] and [place] (masc.) studies criminal Justice.   ADVANCED DIRECTIVES: Not in place   HEALTH MAINTENANCE:  (Updated 11/15/2013) History  Substance  Use Topics  . Smoking status: Never Smoker   . Smokeless tobacco: Never Used  . Alcohol Use: Yes     Comment: 5-7 glasses per week of red wine     Colonoscopy: July 2014  PAP: November 2014  Bone density: November 2014  Lipid panel: Not on file  Allergies  Allergen Reactions  . Compazine [Prochlorperazine] Other (See Comments)    Extreme restlessness and mild extrapyramidal movement;  Patient tolerates Reglan with no problems    Current Outpatient Prescriptions  Medication Sig Dispense Refill  . gabapentin (NEURONTIN) 100 MG capsule Take 1 capsule (100 mg total) by mouth 3 (three) times daily. (Patient taking differently: Take 100 mg by mouth daily. ) 90 capsule 4  . gabapentin (NEURONTIN) 300 MG capsule Take 1 capsule (300 mg total) by mouth 3 (three) times daily. Take with 178m tab to equal 4046mtid (Patient taking differently: Take 300 mg by mouth at bedtime. ) 90 capsule 4  . levothyroxine (SYNTHROID, LEVOTHROID) 112 MCG tablet Take 112 mcg by mouth daily before breakfast.     .  Lorcaserin HCl (BELVIQ) 10 MG TABS Take 1 tablet by mouth 2 (two) times daily.    . nitrofurantoin, macrocrystal-monohydrate, (MACROBID) 100 MG capsule Take 100 mg by mouth daily. PT WILL BE ON THIS MED FOR 30 DAYS  0  . tamoxifen (NOLVADEX) 20 MG tablet Take 1 tablet (20 mg total) by mouth daily. (Patient taking differently: Take 20 mg by mouth every morning. ) 90 tablet 4  . Estradiol 10 MCG TABS vaginal tablet Place 1 tablet (10 mcg total) vaginally 3 (three) times a week. (Patient not taking: Reported on 11/16/2014) 24 tablet 12  . ibuprofen (ADVIL,MOTRIN) 200 MG tablet Take 400 mg by mouth daily as needed for mild pain.    Marland Kitchen LORazepam (ATIVAN) 0.5 MG tablet take 1 tablet by mouth twice a day if needed for anxiety (Patient not taking: Reported on 11/16/2014) 60 tablet 0   No current facility-administered medications for this visit.    OBJECTIVE: Middle-aged white woman who appears stated age 87  Vitals:   11/16/14 1340  BP: 131/66  Pulse: 84  Temp: 97.5 F (36.4 C)  Resp: 18   Body mass index is 32.44 kg/(m^2).    ECOG FS: 1 Filed Weights   11/16/14 1340  Weight: 213 lb 4.8 oz (96.752 kg)    Skin: warm, dry  HEENT: sclerae anicteric, conjunctivae pink, oropharynx clear. No thrush or mucositis.  Lymph Nodes: No cervical or supraclavicular lymphadenopathy  Lungs: clear to auscultation bilaterally, no rales, wheezes, or rhonci  Heart: regular rate and rhythm  Abdomen: round, soft, non tender, positive bowel sounds  Musculoskeletal: No focal spinal tenderness, no peripheral edema  Neuro: non focal, well oriented, positive affect  Breasts: left breast status post lumpectomy and radiation. Tenderness with palpation. No evidence of recurrent disease. Left axilla benign. Right breast unremarkable.   LAB RESULTS: CBC    Component Value Date/Time   WBC 4.9 11/16/2014 1320   WBC 4.5 08/30/2014 0950   RBC 4.12 11/16/2014 1320   RBC 3.86* 08/30/2014 0950   HGB 12.2 11/16/2014 1320   HGB 11.4* 08/30/2014 0950   HCT 37.3 11/16/2014 1320   HCT 35.1* 08/30/2014 0950   PLT 156 11/16/2014 1320   PLT 160 08/30/2014 0950   MCV 90.5 11/16/2014 1320   MCV 90.9 08/30/2014 0950   MCH 29.6 11/16/2014 1320   MCH 29.5 08/30/2014 0950   MCHC 32.7 11/16/2014 1320   MCHC 32.5 08/30/2014 0950   RDW 14.0 11/16/2014 1320   RDW 14.4 08/30/2014 0950   LYMPHSABS 1.7 11/16/2014 1320   MONOABS 0.4 11/16/2014 1320   EOSABS 0.1 11/16/2014 1320   BASOSABS 0.0 11/16/2014 1320    CMP     Component Value Date/Time   NA 139 11/16/2014 1321   NA 140 08/30/2014 0950   K 3.5 11/16/2014 1321   K 3.6 08/30/2014 0950   CL 106 08/30/2014 0950   CO2 27 11/16/2014 1321   CO2 26 08/30/2014 0950   GLUCOSE 125 11/16/2014 1321   GLUCOSE 102* 08/30/2014 0950   BUN 14.7 11/16/2014 1321   BUN 17 08/30/2014 0950   CREATININE 0.8 11/16/2014 1321   CREATININE 0.82 08/30/2014 0950   CALCIUM 9.2 11/16/2014  1321   CALCIUM 8.8 08/30/2014 0950   PROT 6.5 11/16/2014 1321   ALBUMIN 3.8 11/16/2014 1321   AST 20 11/16/2014 1321   ALT 22 11/16/2014 1321   ALKPHOS 95 11/16/2014 1321   BILITOT 0.39 11/16/2014 1321   GFRNONAA 79* 08/30/2014 0950  GFRAA >90 08/30/2014 0950    STUDIES: Most recent mammogram in 06/01/2014 was benign.   ASSESSMENT: 59 y.o. [place] woman, status post left breast biopsy 06/03/2013 for a clinical T2 N0, stage IIA invasive ductal carcinoma, grade 3, estrogen and progesterone receptor positive, with an MIB-1 of 44%, and HER-2 amplified.  (1) status post left lumpectomy and sentinel lymph node sampling 07/26/2013 for a pT2 pN0, stage IIA invasive ductal carcinoma, grade 3, with negative margins.  (2) treated in the adjuvant setting with docetaxel/carboplatin (AUC 5) given along with trastuzumab/pertuzumab x6 cycles, first dose on 09/02/2013, last dose 12/16/2013  (3) trastuzumab continued through through March 2016  (4) adjuvant radiation completed 03/02/2014  (5) tamoxifen started 04/26/2014  (6)  Depression/anxiety - improving. celexa discontinued by PCP  (7) chemotherapy-induced anemia   (8) vaginal dryness/dyspareunia   PLAN: Mallori is doing well today. The labs were reviewed in detail and were entirely stable. She is tolerating the tamoxifen well and will continue this drug for 10 years of antiestrogen therapy (she is uninterested in aromatase inhibitor therapy).   We discussed genetic testing at length. She understands that at the time of diagnosis she did not meet the age requirement for her type of cancer. She can only think of 2 relatives (distant) that had breast or ovarian cancer. At this time she does not have any indications for testing, but was concerned for her daughter. She give some more thought to her family tree to see if there is anyone missing.  Camellia will return in 6 months for labs and a follow up visit. She understands and agrees with this  plans. She knows the goal of treatment in her case is cure. She has been encouraged to call with any issues that might arise before her next visit here.  Laurie Panda, NP   11/16/2014 3:20 PM

## 2014-11-16 NOTE — Telephone Encounter (Signed)
Patient called asking about "naproxen for joints.  Not sure if this is OTC or was I to get a prescription."  Informed her this is now OTC and a common name is Aleve or she can ask her pharmacist what brand they recommend.  Offered to notify Nira Conn but she says she will try what is OTC.

## 2014-11-16 NOTE — Telephone Encounter (Signed)
Appointments made and printed for patient °

## 2015-01-23 ENCOUNTER — Telehealth: Payer: Self-pay | Admitting: Genetic Counselor

## 2015-01-23 NOTE — Telephone Encounter (Signed)
genetic appt-s/w patient and gave genetic appt for 08/31 @ 2 w/Karen Florene Glen.

## 2015-01-25 ENCOUNTER — Encounter: Payer: Self-pay | Admitting: Genetic Counselor

## 2015-01-25 ENCOUNTER — Other Ambulatory Visit: Payer: BLUE CROSS/BLUE SHIELD

## 2015-01-25 ENCOUNTER — Ambulatory Visit (HOSPITAL_BASED_OUTPATIENT_CLINIC_OR_DEPARTMENT_OTHER): Payer: BLUE CROSS/BLUE SHIELD | Admitting: Genetic Counselor

## 2015-01-25 DIAGNOSIS — Z800 Family history of malignant neoplasm of digestive organs: Secondary | ICD-10-CM | POA: Diagnosis not present

## 2015-01-25 DIAGNOSIS — Z315 Encounter for genetic counseling: Secondary | ICD-10-CM

## 2015-01-25 DIAGNOSIS — C50512 Malignant neoplasm of lower-outer quadrant of left female breast: Secondary | ICD-10-CM | POA: Diagnosis not present

## 2015-01-25 NOTE — Progress Notes (Signed)
REFERRING PROVIDER: Juanda Chance, NP [address], [address] [place], [place] [address]  PRIMARY PROVIDER:  [name], NP  PRIMARY REASON FOR VISIT:  1. Breast cancer of lower-outer quadrant of left female breast   2. Family history of colon cancer      HISTORY OF PRESENT ILLNESS:   Lori Vincent, a 59 y.o. female, was seen for a [facility] cancer genetics consultation at the request of Dr. Vicente Serene due to a personal and family history of cancer.  Lori Vincent presents to clinic today to discuss the possibility of a hereditary predisposition to cancer, genetic testing, and to further clarify her future cancer risks, as well as potential cancer risks for family members.   In January 2015, at the age of 47, Lori Vincent was diagnosed with invasive ductal carcinoma of the left breast. The tumor is triple positive. This was treated with lumpectomy, radiation and herceptin.   CANCER HISTORY:   No history exists.     HORMONAL RISK FACTORS:  Menarche was at age 34.  First live birth at age 24.  OCP use for approximately 12 years.  Ovaries intact: yes.  Hysterectomy: no.  Menopausal status: postmenopausal.  HRT use: 0 years. Colonoscopy: yes; one polyp, scheduled for 5 years.. Mammogram within the last year: yes. Number of breast biopsies: 1. Up to date with pelvic exams:  yes. Any excessive radiation exposure in the past:  no  Past Medical History  Diagnosis Date  . Breast cancer 06/03/13    left, 6:00 o'clock  . Depression   . Hypothyroidism   . Hypokalemia     during with chemo 08/2013  . Anemia due to chemotherapy   . History of radiation therapy 01/18/14- 03/02/14    left breast 4680 cGy 26 sessions, boost of 1000 cGy 5 sessions  . Anxiety   . Arthritis     knees and feet  . History of blood transfusion 12/2013    [facility] - 2 units transfused - during chemo  . Family history of colon cancer     Past Surgical History  Procedure Laterality Date  . Tonsillectomy  1972  .  Cesarean section  04/1999  . Breast biopsy  1/14    lt  . Dilation and curettage of uterus    . Colonoscopy  2013  . Breast lumpectomy with needle localization and axillary sentinel lymph node bx Left 07/26/2013    Procedure: BREAST LUMPECTOMY WITH NEEDLE LOCALIZATION AND AXILLARY SENTINEL LYMPH NODE BX;  Surgeon: Shann Medal, MD;  Location: [facility];  Service: General;  Laterality: Left;  Marland Kitchen Mole removal Right 07/26/2013    Procedure: MOLE REMOVAL UNDER RIGHT BREAST;  Surgeon: Shann Medal, MD;  Location: [facility];  Service: General;  Laterality: Right;  . Portacath placement Right 07/26/2013    Procedure: INSERTION PORT-A-CATH;  Surgeon: Shann Medal, MD;  Location: [facility];  Service: General;  Laterality: Right;  . Wisdom tooth extraction    . Dilitation & currettage/hystroscopy with hydrothermal ablation N/A 09/02/2014    Procedure: DILATATION & CURETTAGE/HYSTEROSCOPY WITH HYDROTHERMAL ABLATION;  Surgeon: Dian Queen, MD;  Location: [facility];  Service: Gynecology;  Laterality: N/A;    Social History   Social History  . Marital Status: Widowed    Spouse Name: N/A  . Number of Children: 1  . Years of Education: N/A   Social History Main Topics  . Smoking status: Never Smoker   . Smokeless tobacco:  Never Used  . Alcohol Use: Yes     Comment: 5-7 glasses per week of red wine  . Drug Use: No  . Sexual Activity: Not Currently    Birth Control/ Protection: Post-menopausal     Comment: menarche age 3, 19st live birth 58, P55, HRT x 1 month   Other Topics Concern  . Not on file   Social History Narrative     FAMILY HISTORY:  We obtained a detailed, 4-generation family history.  Significant diagnoses are listed below: Family History  Problem Relation Age of Onset  . Stroke Mother   . Diabetes Father   . Diabetes type I Father   . Colon cancer Maternal Grandmother 64  . Healthy Sister   . Healthy Brother   . Pulmonary  embolism Maternal Uncle   . Colon cancer Paternal Uncle 90  . Stomach cancer Maternal Grandfather 94    pipe smoker  . Diabetes type I Paternal Grandmother   . Lung cancer Paternal Grandfather   . Heart Problems Paternal Uncle    The patient has one daughter, a sister and an adopted brother.  Her mother had a lumpectomy when she was in her 41s-40s but it is unknown if it was a benign lump or cancer.  Her mother had one brother who died of a PE.  Her maternal grandparents had cancer, her grandfather had stomach cancer at 67 and her grandmother had colon cancer at 66.  Her grandmother's sister had kidney cancer.  Her father died in a MVA.  He had prostate issues, but they do not know if it was cancer.  He had two brothers, one had colon cancer.  Her paternal grandparents died at older ages, her grandfather had lung cancer.  Patient's maternal ancestors are of [place] and [place] descent, and paternal ancestors are of [place] and [place] descent. There is no reported Ashkenazi Jewish ancestry. There is no known consanguinity.  GENETIC COUNSELING ASSESSMENT: Lori Vincent is a 59 y.o. female with a personal and family history of cancer which somewhat suggestive of a familial or sporadic disease. We, therefore, discussed and recommended the following at today's visit.   DISCUSSION: We discussed with Lori Vincent that the family history is not highly consistent with a familial hereditary cancer syndrome, and we feel she is at low risk to harbor a gene mutation associated with such a condition. However, based on Lori Vincent's concern, she decided to pay for genetic testing out of pocket.  We did review the Lynch syndrome genes and CHEK2 as both of these conditions are associated with both breast and colon cancer.  Testing through [facility], out of pocket, is $475.  We ordered a custom panel.  The custom gene panel offered by [facility] includes sequencing and rearrangement analysis for the following 24 genes: ATM, BARD1,  BRCA1, BRCA2, BRIP1, CDH1, CHEK2, EPCAM, MLH1, MRE11A, MSH2, MSH6, MUTYH, NBN, NF1, PALB2, PMS2, PTEN, RAD50, RAD51C, RAD51D, SMARCA4, STK11, and TP53.   PLAN: After considering the risks, benefits, and limitations, Lori Vincent  provided informed consent to pursue genetic testing and the blood sample was sent to [facility] for analysis of the custom gene panel that looks at breast/ovarian and colon cancer genes. Results should be available within approximately 2-3 weeks' time, at which point they will be disclosed by telephone to Lori Vincent, as will any additional recommendations warranted by these results. Lori Vincent will receive a summary of her genetic counseling visit and a copy of her results once available.  This information will also be available in Epic. We encouraged Lori Vincent to remain in contact with cancer genetics annually so that we can continuously update the family history and inform her of any changes in cancer genetics and testing that may be of benefit for her family. Lori Vincent questions were answered to her satisfaction today. Our contact information was provided should additional questions or concerns arise.  Lastly, we encouraged Lori Vincent to remain in contact with cancer genetics annually so that we can continuously update the family history and inform her of any changes in cancer genetics and testing that may be of benefit for this family.   Ms.  Vincent questions were answered to her satisfaction today. Our contact information was provided should additional questions or concerns arise. Thank you for the referral and allowing [place] to share in the care of your patient.   Karen P. Florene Glen, [place], [facility] Certified Genetic Counselor [email or web address] phone: [phone]  The patient was seen for a total of 75 minutes in face-to-face genetic counseling.  This patient was discussed with Drs. Magrinat, Lindi Adie and/or Burr Medico who agrees with the above.     _______________________________________________________________________ For Office Staff:  Number of people involved in session: 1 Was an Intern/ student involved with case: no

## 2015-02-01 ENCOUNTER — Other Ambulatory Visit: Payer: Self-pay | Admitting: *Deleted

## 2015-02-01 MED ORDER — LORAZEPAM 0.5 MG PO TABS: ORAL_TABLET | ORAL | Status: DC

## 2015-02-08 ENCOUNTER — Encounter (HOSPITAL_COMMUNITY): Payer: Self-pay

## 2015-02-15 ENCOUNTER — Telehealth: Payer: Self-pay | Admitting: Genetic Counselor

## 2015-02-15 NOTE — Telephone Encounter (Signed)
Revealed that genetic testing was completely negative.  No pathogenic mutations found.

## 2015-02-20 ENCOUNTER — Encounter: Payer: Self-pay | Admitting: Genetic Counselor

## 2015-02-20 ENCOUNTER — Ambulatory Visit: Payer: Self-pay | Admitting: Genetic Counselor

## 2015-02-20 DIAGNOSIS — Z800 Family history of malignant neoplasm of digestive organs: Secondary | ICD-10-CM

## 2015-02-20 DIAGNOSIS — C50512 Malignant neoplasm of lower-outer quadrant of left female breast: Secondary | ICD-10-CM

## 2015-02-20 DIAGNOSIS — Z1379 Encounter for other screening for genetic and chromosomal anomalies: Secondary | ICD-10-CM

## 2015-02-20 NOTE — Progress Notes (Signed)
HPI: Lori Vincent was previously seen in the [place] clinic due to a personal and family history of cancer and concerns regarding a hereditary predisposition to cancer. Please refer to our prior cancer genetics clinic note for more information regarding Lori Vincent's medical, social and family histories, and our assessment and recommendations, at the time. Lori Vincent recent genetic test results were disclosed to her, as were recommendations warranted by these results. These results and recommendations are discussed in more detail below.  FAMILY HISTORY:  We obtained a detailed, 4-generation family history.  Significant diagnoses are listed below: Family History  Problem Relation Age of Onset  . Stroke Mother   . Diabetes Father   . Diabetes type I Father   . Colon cancer Maternal Grandmother 8  . Healthy Sister   . Healthy Brother   . Pulmonary embolism Maternal Uncle   . Colon cancer Paternal Uncle 24  . Stomach cancer Maternal Grandfather 94    pipe smoker  . Diabetes type I Paternal Grandmother   . Lung cancer Paternal Grandfather   . Heart Problems Paternal Uncle     The patient has one daughter, a sister and an adopted brother. Her mother had a lumpectomy when she was in her 62s-40s but it is unknown if it was a benign lump or cancer. Her mother had one brother who died of a PE. Her maternal grandparents had cancer, her grandfather had stomach cancer at 71 and her grandmother had colon cancer at 32. Her grandmother's sister had kidney cancer. Her father died in a MVA. He had prostate issues, but they do not know if it was cancer. He had two brothers, one had colon cancer. Her paternal grandparents died at older ages, her grandfather had lung cancer. Patient's maternal ancestors are of [place] and [place] descent, and paternal ancestors are of [place] and [place] descent. There is no reported Ashkenazi Jewish ancestry. There is no known consanguinity. GENETIC TEST  RESULTS: At the time of Lori Vincent's visit, we recommended she pursue genetic testing of the custom gene panel. The custom cancer gene panel offered by [facility] includes sequencing and rearrangement analysis for the following 24 genes:  ATM, BARD1, BRCA1, BRCA2, BRIP1, CDH1, CHEK2, EPCAM, MLH1, MRE11A, MSH2, MSH6, MUTYH, NBN, NF1, PALB2, PMS2, PTEN, RAD50, RAD51C, RAD51D, SMARCA4, STK11, and TP53.   The report date is February 14, 2015.  Genetic testing was normal, and did not reveal a deleterious mutation in these genes. The test report has been scanned into EPIC and is located under the Molecular Pathology section of the Results Review tab. Lori Vincent will receive a Secure email with her results.  We discussed with Lori Vincent that since the current genetic testing is not perfect, it is possible there may be a gene mutation in one of these genes that current testing cannot detect, but that chance is small. We also discussed, that it is possible that another gene that has not yet been discovered, or that we have not yet tested, is responsible for the cancer diagnoses in the family, and it is, therefore, important to remain in touch with cancer genetics in the future so that we can continue to offer Lori Vincent the most up to date genetic testing.   CANCER SCREENING RECOMMENDATIONS: This result is reassuring and indicates that Lori Vincent likely does not have an increased risk for a future cancer due to a mutation in one of these genes. This normal test also suggests that Lori Vincent's cancer  was most likely not due to an inherited predisposition associated with one of these genes.  Most cancers happen by chance and this negative test suggests that her cancer falls into this category.  We, therefore, recommended she continue to follow the cancer management and screening guidelines provided by her oncology and primary healthcare provider.   RECOMMENDATIONS FOR FAMILY MEMBERS: Women in this family might be at some  increased risk of developing cancer, over the general population risk, simply due to the family history of cancer. We recommended women in this family have a yearly mammogram beginning at age 56, or 75 years younger than the earliest onset of cancer, an an annual clinical breast exam, and perform monthly breast self-exams. Women in this family should also have a gynecological exam as recommended by their primary provider. All family members should have a colonoscopy by age 64.  FOLLOW-UP: Lastly, we discussed with Lori Vincent that cancer genetics is a rapidly advancing field and it is possible that new genetic tests will be appropriate for her and/or her family members in the future. We encouraged her to remain in contact with cancer genetics on an annual basis so we can update her personal and family histories and let her know of advances in cancer genetics that may benefit this family.   Our contact number was provided. Lori Vincent questions were answered to her satisfaction, and she knows she is welcome to call us at anytime with additional questions or concerns.   Lori Kayser, MS, [facility] Certified Genetic Counselor [email or web address]

## 2015-03-02 ENCOUNTER — Telehealth: Payer: Self-pay

## 2015-03-02 NOTE — Telephone Encounter (Signed)
Patient called today wondering whether she can use latisse since she is not on active treatment.  Per Dr. Lindi Adie patient can use latisse to enhance her eyelashes to grow.

## 2015-04-04 ENCOUNTER — Other Ambulatory Visit: Payer: Self-pay

## 2015-04-04 ENCOUNTER — Other Ambulatory Visit: Payer: Self-pay | Admitting: Oncology

## 2015-04-04 DIAGNOSIS — Z9889 Other specified postprocedural states: Secondary | ICD-10-CM

## 2015-05-02 ENCOUNTER — Other Ambulatory Visit: Payer: Self-pay

## 2015-05-10 ENCOUNTER — Other Ambulatory Visit: Payer: Self-pay | Admitting: *Deleted

## 2015-05-10 DIAGNOSIS — C50512 Malignant neoplasm of lower-outer quadrant of left female breast: Secondary | ICD-10-CM

## 2015-05-11 ENCOUNTER — Telehealth: Payer: Self-pay | Admitting: Oncology

## 2015-05-11 ENCOUNTER — Other Ambulatory Visit (HOSPITAL_BASED_OUTPATIENT_CLINIC_OR_DEPARTMENT_OTHER): Payer: BLUE CROSS/BLUE SHIELD

## 2015-05-11 ENCOUNTER — Ambulatory Visit (HOSPITAL_BASED_OUTPATIENT_CLINIC_OR_DEPARTMENT_OTHER): Payer: BLUE CROSS/BLUE SHIELD | Admitting: Oncology

## 2015-05-11 VITALS — BP 137/65 | HR 79 | Temp 98.5°F | Resp 18 | Ht 68.0 in | Wt 214.9 lb

## 2015-05-11 DIAGNOSIS — Z170 Estrogen receptor positive status [ER+]: Secondary | ICD-10-CM | POA: Diagnosis not present

## 2015-05-11 DIAGNOSIS — Z79810 Long term (current) use of selective estrogen receptor modulators (SERMs): Secondary | ICD-10-CM | POA: Diagnosis not present

## 2015-05-11 DIAGNOSIS — C50512 Malignant neoplasm of lower-outer quadrant of left female breast: Secondary | ICD-10-CM

## 2015-05-11 DIAGNOSIS — C50812 Malignant neoplasm of overlapping sites of left female breast: Secondary | ICD-10-CM

## 2015-05-11 LAB — COMPREHENSIVE METABOLIC PANEL
ALBUMIN: 3.9 g/dL (ref 3.5–5.0)
ALT: 28 U/L (ref 0–55)
AST: 24 U/L (ref 5–34)
Alkaline Phosphatase: 93 U/L (ref 40–150)
Anion Gap: 11 mEq/L (ref 3–11)
BUN: 16.9 mg/dL (ref 7.0–26.0)
CALCIUM: 9.3 mg/dL (ref 8.4–10.4)
CO2: 26 mEq/L (ref 22–29)
Chloride: 105 mEq/L (ref 98–109)
Creatinine: 1.1 mg/dL (ref 0.6–1.1)
EGFR: 57 mL/min/{1.73_m2} — ABNORMAL LOW (ref >90)
GLUCOSE: 123 mg/dL (ref 70–140)
Potassium: 3.5 mEq/L (ref 3.5–5.1)
Sodium: 141 mEq/L (ref 136–145)
Total Bilirubin: 0.34 mg/dL (ref 0.20–1.20)
Total Protein: 6.7 g/dL (ref 6.4–8.3)

## 2015-05-11 LAB — CBC WITH DIFFERENTIAL/PLATELET
BASO%: 0.8 % (ref 0.0–2.0)
Basophils Absolute: 0 10*3/uL (ref 0.0–0.1)
EOS%: 3.8 % (ref 0.0–7.0)
Eosinophils Absolute: 0.2 10*3/uL (ref 0.0–0.5)
HEMATOCRIT: 37.8 % (ref 34.8–46.6)
HEMOGLOBIN: 12.1 g/dL (ref 11.6–15.9)
LYMPH#: 2 10*3/uL (ref 0.9–3.3)
LYMPH%: 39.8 % (ref 14.0–49.7)
MCH: 28.9 pg (ref 25.1–34.0)
MCHC: 32.1 g/dL (ref 31.5–36.0)
MCV: 89.8 fL (ref 79.5–101.0)
MONO#: 0.4 10*3/uL (ref 0.1–0.9)
MONO%: 8.7 % (ref 0.0–14.0)
NEUT%: 46.9 % (ref 38.4–76.8)
NEUTROS ABS: 2.4 10*3/uL (ref 1.5–6.5)
Platelets: 174 10*3/uL (ref 145–400)
RBC: 4.21 10*6/uL (ref 3.70–5.45)
RDW: 13.8 % (ref 11.2–14.5)
WBC: 5 10*3/uL (ref 3.9–10.3)

## 2015-05-11 NOTE — Progress Notes (Signed)
[facility]  [phone] [phone]) [phone]     ID: Lori Vincent DOB: 06-24-1955  MR#: [id number]  RJJ#:[id number]  PCP: [name], NP GYN:  [name], MD SU: [name], MD OTHER MD: Arloa Koh, MD  CHIEF COMPLAINT: estrogen receptor positive breast cancer  CURRENT TREATMENT: tamoxifen   BREAST CANCER HISTORY: From the original intake note:  Lori Vincent [facility] showing a possible mass in the left breast. Diagnostic left mammography and ultrasonography at the breast center 04/26/2013 confirmed an oval mass with microcalcifications in the 6:00 position of the left breast. There was an adjacent partially obscured 1.2 cm mass. On physical exam there was no palpable mass in the left breast. By ultrasonography, there was an oval mass at the 6:00 position measuring 1.4 cm, with a second mass measuring 1.1 cm. There were no abnormal lymph nodes noted in the left axilla.  Biopsy of the 6:00 mass in the left breast January 2015 showed ([id number]) and invasive ductal carcinoma, grade 3, estrogen receptor 97% positive, progesterone receptor 75% positive, with strong staining intensity, and MIB-144%. HER-2 was amplified, with the signals ratio 4.18 and in number per cell of 9.40 .  On 06/08/2013 the patient underwent bilateral breast MRI which showed a dumbbell-shaped area in the central left breast consisting of a 2 cm mass and 2 extensions or an adjacent masses, measuring a total of 2.9 cm. There were no abnormal appearing lymph nodes for any other areas of concern.  The patient's subsequent history is as detailed below.  INTERVAL HISTORY: Lori Vincent returns today for follow up of her estrogen receptor positivebreast cancer.  She continues on tamoxifen. She generally tolerates that well, aside from hot flashes. She associates other menopausal symptoms such as moodiness , weight gain, and insomnia with  that drug. She obtains it at a good price.  REVIEW OF SYSTEMS: Lori Vincent Still has neuropathic pain involving her feet. This is better with activity. She feels she doesn't have the same strength that she used to. She also has more joint pains then she used to before she received chemotherapy. She is worried about gaining weight. She feels generally fit, but can be a little bit more short of breath than she used to be when exerting herself. She still having  visualaccommodation problems.  In short, she is feeling more like her new normal, but she is " not quite.". A detailed review of systems today was otherwise noncontributory  PAST MEDICAL HISTORY: Past Medical History  Diagnosis Date  . Breast cancer 06/03/13    left, 6:00 o'clock  . Depression   . Hypothyroidism   . Hypokalemia     during with chemo 08/2013  . Anemia due to chemotherapy   . History of radiation therapy 01/18/14- 03/02/14    left breast 4680 cGy 26 sessions, boost of 1000 cGy 5 sessions  . Anxiety   . Arthritis     knees and feet  . History of blood transfusion 12/2013    [facility] - 2 units transfused - during chemo  . Family history of colon cancer     PAST SURGICAL HISTORY: Past Surgical History  Procedure Laterality Date  . Tonsillectomy  1972  . Cesarean section  04/1999  . Breast biopsy  1/14    lt  . Dilation and curettage of uterus    . Colonoscopy  2013  . Breast lumpectomy with needle localization and axillary sentinel lymph node bx Left  07/26/2013    Procedure: BREAST LUMPECTOMY WITH NEEDLE LOCALIZATION AND AXILLARY SENTINEL LYMPH NODE BX;  Surgeon: Shann Medal, MD;  Location: [facility];  Service: General;  Laterality: Left;  Marland Kitchen Mole removal Right 07/26/2013    Procedure: MOLE REMOVAL UNDER RIGHT BREAST;  Surgeon: Shann Medal, MD;  Location: [facility];  Service: General;  Laterality: Right;  . Portacath placement Right 07/26/2013    Procedure: INSERTION PORT-A-CATH;  Surgeon: Shann Medal, MD;  Location: [facility];  Service: General;  Laterality: Right;  . Wisdom tooth extraction    . Dilitation & currettage/hystroscopy with hydrothermal ablation N/A 09/02/2014    Procedure: DILATATION & CURETTAGE/HYSTEROSCOPY WITH HYDROTHERMAL ABLATION;  Surgeon: Dian Queen, MD;  Location: [facility];  Service: Gynecology;  Laterality: N/A;    FAMILY HISTORY Family History  Problem Relation Age of Onset  . Stroke Mother   . Diabetes Father   . Diabetes type I Father   . Colon cancer Maternal Grandmother 26  . Healthy Sister   . Healthy Brother   . Pulmonary embolism Maternal Uncle   . Colon cancer Paternal Uncle 21  . Stomach cancer Maternal Grandfather 94    pipe smoker  . Diabetes type I Paternal Grandmother   . Lung cancer Paternal Grandfather   . Heart Problems Paternal Uncle    the patient's father died at the age of 83 in a car accident. The patient's mother died at the age of 82 from a stroke. The patient has one adopted brother. She has one biological sister. The patient's mother's mother was diagnosed with colon cancer at the age of 61. There is no history of breast oropharynx cancer in the family to the patient's knowledge.  She asked about her daughter needing testing for brca and i said no.  GYNECOLOGIC HISTORY:   (Reviewed 11/15/2013) Menarche age 58. First live birth age 71. The patient is GX P1. She took birth control pills for approximately 12 years remotely. She had a period in February of 2014 and then in December of 2014. She had been started on hormone replacement in December. That has since been discontinued.  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 Her husband died at [facility] 3 years ago with soft tissue sarcoma. The patient's significant other also works as a Geophysicist/field seismologist. At home it is just the patient and  her daughter 59, 9 years old, who just finished middle school. The patient has 2 stepchildren from her earlier marriage. 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   ADVANCED DIRECTIVES: Not in place   HEALTH MAINTENANCE:  (Updated 11/15/2013) Social History  Substance Use Topics  . Smoking status: Never Smoker   . Smokeless tobacco: Never Used  . Alcohol Use: Yes     Comment: 5-7 glasses per week of red wine     Colonoscopy: July 2014  PAP: November 2014  Bone density: November 2014  Lipid panel: Not on file  Allergies  Allergen Reactions  . Compazine [Prochlorperazine] Other (See Comments)    Extreme restlessness and mild extrapyramidal movement;  Patient tolerates Reglan with no problems    Current Outpatient Prescriptions  Medication Sig Dispense Refill  . Estradiol 10 MCG TABS vaginal tablet Place 1 tablet (10 mcg total) vaginally 3 (three) times a week. (Patient  not taking: Reported on 11/16/2014) 24 tablet 12  . gabapentin (NEURONTIN) 100 MG capsule Take 1 capsule (100 mg total) by mouth 3 (three) times daily. (Patient taking differently: Take 100 mg by mouth daily. ) 90 capsule 4  . gabapentin (NEURONTIN) 300 MG capsule Take 1 capsule (300 mg total) by mouth 3 (three) times daily. Take with 146m tab to equal 4056mtid (Patient taking differently: Take 300 mg by mouth at bedtime. ) 90 capsule 4  . ibuprofen (ADVIL,MOTRIN) 200 MG tablet Take 400 mg by mouth daily as needed for mild pain.    . Marland Kitchenevothyroxine (SYNTHROID, LEVOTHROID) 112 MCG tablet Take 112 mcg by mouth daily before breakfast.     . LORazepam (ATIVAN) 0.5 MG tablet take 1 tablet by mouth twice a day if needed for anxiety 60 tablet 0  . Lorcaserin HCl (BELVIQ) 10 MG TABS Take 1 tablet by mouth 2 (two) times daily.    . nitrofurantoin, macrocrystal-monohydrate, (MACROBID) 100 MG capsule Take 100 mg by mouth daily. PT WILL BE ON THIS MED FOR 30 DAYS  0  . tamoxifen  (NOLVADEX) 20 MG tablet Take 1 tablet (20 mg total) by mouth daily. (Patient taking differently: Take 20 mg by mouth every morning. ) 90 tablet 4   No current facility-administered medications for this visit.    OBJECTIVE: Middle-aged white woman in no acute distress Filed Vitals:   05/11/15 1453  BP: 137/65  Pulse: 79  Temp: 98.5 F (36.9 C)  Resp: 18   Body mass index is 32.68 kg/(m^2).    ECOG FS: 1 Filed Weights   05/11/15 1453  Weight: 214 lb 14.4 oz (97.478 kg)    Sclerae unicteric, pupils round and equal Oropharynx clear and moist-- no thrush or other lesions No cervical or supraclavicular adenopathy Lungs no rales or rhonchi Heart regular rate and rhythm Abd soft, nontender, positive bowel sounds MSK no focal spinal tenderness, no upper extremity lymphedema Neuro: nonfocal, well oriented, appropriate affect Breasts:  The right breast is unremarkable. The left breast is status post lumpectomy and radiation. There is still mild hyperpigmentation. There is no evidence of disease recurrence. The left axilla is benign.   LAB RESULTS: CBC    Component Value Date/Time   WBC 5.0 05/11/2015 1434   WBC 4.5 08/30/2014 0950   RBC 4.21 05/11/2015 1434   RBC 3.86* 08/30/2014 0950   HGB 12.1 05/11/2015 1434   HGB 11.4* 08/30/2014 0950   HCT 37.8 05/11/2015 1434   HCT 35.1* 08/30/2014 0950   PLT 174 05/11/2015 1434   PLT 160 08/30/2014 0950   MCV 89.8 05/11/2015 1434   MCV 90.9 08/30/2014 0950   MCH 28.9 05/11/2015 1434   MCH 29.5 08/30/2014 0950   MCHC 32.1 05/11/2015 1434   MCHC 32.5 08/30/2014 0950   RDW 13.8 05/11/2015 1434   RDW 14.4 08/30/2014 0950   LYMPHSABS 2.0 05/11/2015 1434   MONOABS 0.4 05/11/2015 1434   EOSABS 0.2 05/11/2015 1434   BASOSABS 0.0 05/11/2015 1434    CMP     Component Value Date/Time   NA 139 11/16/2014 1321   NA 140 08/30/2014 0950   K 3.5 11/16/2014 1321   K 3.6 08/30/2014 0950   CL 106 08/30/2014 0950   CO2 27 11/16/2014 1321    CO2 26 08/30/2014 0950   GLUCOSE 125 11/16/2014 1321   GLUCOSE 102* 08/30/2014 0950   BUN 14.7 11/16/2014 1321   BUN 17 08/30/2014 0950   CREATININE 0.8 11/16/2014 1321  CREATININE 0.82 08/30/2014 0950   CALCIUM 9.2 11/16/2014 1321   CALCIUM 8.8 08/30/2014 0950   PROT 6.5 11/16/2014 1321   ALBUMIN 3.8 11/16/2014 1321   AST 20 11/16/2014 1321   ALT 22 11/16/2014 1321   ALKPHOS 95 11/16/2014 1321   BILITOT 0.39 11/16/2014 1321   GFRNONAA 79* 08/30/2014 0950   GFRAA >90 08/30/2014 0950    STUDIES:  repeat mammography due next month  ASSESSMENT: 59 y.o. Lori Vincent woman, status post left breast biopsy 06/03/2013 for a clinical T2 N0, stage IIA invasive ductal carcinoma, grade 3, estrogen and progesterone receptor positive, with an MIB-1 of 44%, and HER-2 amplified.  (1) status post left lumpectomy and sentinel lymph node sampling 07/26/2013 for a pT2 pN0, stage IIA invasive ductal carcinoma, grade 3, with negative margins.  (2) treated in the adjuvant setting with docetaxel/carboplatin (AUC 5) given along with trastuzumab/pertuzumab x6 cycles, first dose on 09/02/2013, last dose 12/16/2013  (3) trastuzumab continued through through March 2016  (4) adjuvant radiation completed 03/02/2014  (5) tamoxifen started 04/26/2014  (6)  Depression/anxiety - on citalopram  (7) chemotherapy-induced anemia -- resolved  (8) vaginal dryness/dyspareunia: s/p MonaLisa procedure; on vaginal estrogens  (9)   Genetics testing 02/14/2015  Through thecustom cancer gene panel offered by [facility] i found no deleterious mutations in ATM, BARD1, BRCA1, BRCA2, BRIP1, CDH1, CHEK2, EPCAM, MLH1, MRE11A, MSH2, MSH6, MUTYH, NBN, NF1, PALB2, PMS2, PTEN, RAD50, RAD51C, RAD51D, SMARCA4, STK11, and TP53.    PLAN: Karem continues to recover from her earlier treatments. She has some persistent neuropathy, and unfortunately we do not have a good treatment for this. Options include metanx,  B complex vitamins,  acupuncture, and local treatments such as rough socks or the use of bag balm. Luckily it feels better to her when she is active so this is not cramping her activity level.   She benefited from the laser treatment under Dr. Ellery Plunk wall. She has stopped using the estrogen vaginal suppositories. I  Suggest that she continues with the estrogen treatments to prolong the benefit of the laser procedure. She can also switch if she wishes to the [place] system.    she is concerned about her weight. We discussed the difference between weight and fitness. We also discussed the difference between a medical weight problem and a cosmetic one.  She will be starting with a personal trainer soon and hopefully will be able to achieve her goals in that regard.   she is also experiencing menopausal symptoms including irritability, hot flashes, the weight gain, insomnia, and so on. She understands this is not due to to tamoxifen, although it can worsen the hot flashes.  The plan at this point remains to continue tamoxifen a minimum of 5 years   she will see me again in early May. She knows to call for any problems that may develop before that visit.   Chauncey Cruel, MD   05/11/2015 3:09 PM

## 2015-05-17 ENCOUNTER — Telehealth: Payer: Self-pay | Admitting: *Deleted

## 2015-05-17 NOTE — Telephone Encounter (Signed)
LVM for patient.  Per Dr. Jana Hakim he is not concerned about the EGFR dropping.  This may indicate that patient is not drinking enough water.  Patient encouraged to increase fluid intake.

## 2015-05-17 NOTE — Telephone Encounter (Signed)
Patient called expressing "concern about EGFR dropping.  It's not an emergency butI'm alarmed.  Would like Dr. Jana Hakim to let er know why.  Return number [phone].

## 2015-06-05 ENCOUNTER — Other Ambulatory Visit: Payer: BLUE CROSS/BLUE SHIELD

## 2015-06-06 ENCOUNTER — Ambulatory Visit
Admission: RE | Admit: 2015-06-06 | Discharge: 2015-06-06 | Disposition: A | Discharge status: Home / Self Care | Payer: BLUE CROSS/BLUE SHIELD | Source: Ambulatory Visit | Attending: Oncology | Admitting: Oncology

## 2015-06-06 ENCOUNTER — Inpatient Hospital Stay: Admission: RE | Admit: 2015-06-06 | Payer: BLUE CROSS/BLUE SHIELD | Source: Ambulatory Visit

## 2015-06-06 DIAGNOSIS — Z9889 Other specified postprocedural states: Secondary | ICD-10-CM

## 2015-09-27 ENCOUNTER — Other Ambulatory Visit: Payer: Self-pay

## 2015-09-27 DIAGNOSIS — C50512 Malignant neoplasm of lower-outer quadrant of left female breast: Secondary | ICD-10-CM

## 2015-09-28 ENCOUNTER — Other Ambulatory Visit (HOSPITAL_BASED_OUTPATIENT_CLINIC_OR_DEPARTMENT_OTHER): Payer: BLUE CROSS/BLUE SHIELD

## 2015-09-28 ENCOUNTER — Ambulatory Visit (HOSPITAL_BASED_OUTPATIENT_CLINIC_OR_DEPARTMENT_OTHER): Payer: BLUE CROSS/BLUE SHIELD | Admitting: Oncology

## 2015-09-28 ENCOUNTER — Telehealth: Payer: Self-pay | Admitting: Oncology

## 2015-09-28 VITALS — BP 144/73 | HR 70 | Temp 98.0°F | Resp 18 | Ht 68.0 in | Wt 214.0 lb

## 2015-09-28 DIAGNOSIS — Z170 Estrogen receptor positive status [ER+]: Secondary | ICD-10-CM

## 2015-09-28 DIAGNOSIS — Z79810 Long term (current) use of selective estrogen receptor modulators (SERMs): Secondary | ICD-10-CM

## 2015-09-28 DIAGNOSIS — C50512 Malignant neoplasm of lower-outer quadrant of left female breast: Secondary | ICD-10-CM

## 2015-09-28 DIAGNOSIS — G620 Drug-induced polyneuropathy: Secondary | ICD-10-CM

## 2015-09-28 DIAGNOSIS — C50812 Malignant neoplasm of overlapping sites of left female breast: Secondary | ICD-10-CM

## 2015-09-28 DIAGNOSIS — T451X5A Adverse effect of antineoplastic and immunosuppressive drugs, initial encounter: Secondary | ICD-10-CM

## 2015-09-28 DIAGNOSIS — R232 Flushing: Secondary | ICD-10-CM

## 2015-09-28 LAB — CBC WITH DIFFERENTIAL/PLATELET
BASO%: 0.4 % (ref 0.0–2.0)
Basophils Absolute: 0 10*3/uL (ref 0.0–0.1)
EOS ABS: 0.2 10*3/uL (ref 0.0–0.5)
EOS%: 3.2 % (ref 0.0–7.0)
HCT: 37.2 % (ref 34.8–46.6)
HGB: 12.1 g/dL (ref 11.6–15.9)
LYMPH%: 37.6 % (ref 14.0–49.7)
MCH: 29.7 pg (ref 25.1–34.0)
MCHC: 32.5 g/dL (ref 31.5–36.0)
MCV: 91.2 fL (ref 79.5–101.0)
MONO#: 0.5 10*3/uL (ref 0.1–0.9)
MONO%: 9.8 % (ref 0.0–14.0)
NEUT%: 49 % (ref 38.4–76.8)
NEUTROS ABS: 2.3 10*3/uL (ref 1.5–6.5)
Platelets: 167 10*3/uL (ref 145–400)
RBC: 4.08 10*6/uL (ref 3.70–5.45)
RDW: 14.8 % — ABNORMAL HIGH (ref 11.2–14.5)
WBC: 4.7 10*3/uL (ref 3.9–10.3)
lymph#: 1.8 10*3/uL (ref 0.9–3.3)

## 2015-09-28 LAB — COMPREHENSIVE METABOLIC PANEL
ALT: 36 U/L (ref 0–55)
AST: 31 U/L (ref 5–34)
Albumin: 4 g/dL (ref 3.5–5.0)
Alkaline Phosphatase: 67 U/L (ref 40–150)
Anion Gap: 9 mEq/L (ref 3–11)
BUN: 12.2 mg/dL (ref 7.0–26.0)
CHLORIDE: 103 meq/L (ref 98–109)
CO2: 28 meq/L (ref 22–29)
Calcium: 9.3 mg/dL (ref 8.4–10.4)
Creatinine: 0.8 mg/dL (ref 0.6–1.1)
EGFR: 80 mL/min/{1.73_m2} — AB (ref >90)
GLUCOSE: 112 mg/dL (ref 70–140)
POTASSIUM: 3.9 meq/L (ref 3.5–5.1)
SODIUM: 139 meq/L (ref 136–145)
TOTAL PROTEIN: 6.7 g/dL (ref 6.4–8.3)
Total Bilirubin: 0.47 mg/dL (ref 0.20–1.20)

## 2015-09-28 NOTE — Telephone Encounter (Signed)
appt made and avs printed °

## 2015-09-28 NOTE — Progress Notes (Signed)
Lori Vincent  [phone] [phone]     ID: RALYNN SAN DOB: 1955-10-30  MR#: [id number]  INO#:[id number]  PCP: Lori Fusi, NP GYN:  Lori Posey, MD SU: Lori Overall, MD OTHER MD: Lori Koh, MD  CHIEF COMPLAINT: estrogen receptor positive breast cancer  CURRENT TREATMENT: tamoxifen   BREAST CANCER HISTORY: From the original intake note:  Lori Vincent [facility] showing a possible mass in the left breast. Diagnostic left mammography and ultrasonography at the breast center 04/26/2013 confirmed an oval mass with microcalcifications in the 6:00 position of the left breast. There was an adjacent partially obscured 1.2 cm mass. On physical exam there was no palpable mass in the left breast. By ultrasonography, there was an oval mass at the 6:00 position measuring 1.4 cm, with a second mass measuring 1.1 cm. There were no abnormal lymph nodes noted in the left axilla.  Biopsy of the 6:00 mass in the left breast January 2015 showed ([id number]) and invasive ductal carcinoma, grade 3, estrogen receptor 97% positive, progesterone receptor 75% positive, with strong staining intensity, and MIB-144%. HER-2 was amplified, with the signals ratio 4.18 and in number per cell of 9.40 .  On 06/08/2013 the patient underwent bilateral breast MRI which showed a dumbbell-shaped area in the central left breast consisting of a 2 cm mass and 2 extensions or an adjacent masses, measuring a total of 2.9 cm. There were no abnormal appearing lymph nodes for any other areas of concern.  The patient's subsequent history is as detailed below.  INTERVAL HISTORY: Lori Vincent returns today for follow up of her breast cancer. She is in her second year of tamoxifen. Generally she tolerates that well, although she still has hot flashes and some night sweats. She obtains it at a very good price.  REVIEW OF SYSTEMS: She still has  neuropathy in her feet. Orthostatics help, she takes Neurontin 300 mg at bedtime and there is room therefore increase. She is starting to feel stronger, and her head is clear. He is going to the gym regularly and doing some walking. Occasionally she has something that she calls "vibrations" or "internal shivers". These localized to the left chest wall over) large area, from the left upper outer quadrant to the  Left shoulder. There are very irregular. There not provoked by anything she can identify. They feel like her phone is going off. She also has occasional pains in the upper right quadrant of the abdomen. This tends to be most common andpost prandial.she hasn't differentiated between fatty meals and non-fatty meals. Occasional she has itchy skin especially over the left breast. She tries not to scratch it. She has some routine arthritis and a little bit of gel phenomenon in the morning, lasting about 20 minutes until she "gets going." She feels anxious but not depressed. She has restless legs. A detailed review of systems today was otherwise stable  PAST MEDICAL HISTORY: Past Medical History  Diagnosis Date  . Breast cancer 06/03/13    left, 6:00 o'clock  . Depression   . Hypothyroidism   . Hypokalemia     during with chemo 08/2013  . Anemia due to chemotherapy   . History of radiation therapy 01/18/14- 03/02/14    left breast 4680 cGy 26 sessions, boost of 1000 cGy 5 sessions  . Anxiety   . Arthritis     knees and feet  . History of blood transfusion 12/2013    [facility] -  2 units transfused - during chemo  . Family history of colon cancer     PAST SURGICAL HISTORY: Past Surgical History  Procedure Laterality Date  . Tonsillectomy  1972  . Cesarean section  04/1999  . Breast biopsy  1/14    lt  . Dilation and curettage of uterus    . Colonoscopy  2013  . Breast lumpectomy with needle localization and axillary sentinel lymph node bx Left 07/26/2013    Procedure: BREAST LUMPECTOMY WITH NEEDLE  LOCALIZATION AND AXILLARY SENTINEL LYMPH NODE BX;  Surgeon: Lori Medal, MD;  Location: [facility];  Service: General;  Laterality: Left;  Marland Kitchen Mole removal Right 07/26/2013    Procedure: MOLE REMOVAL UNDER RIGHT BREAST;  Surgeon: Lori Medal, MD;  Location: [facility];  Service: General;  Laterality: Right;  . Portacath placement Right 07/26/2013    Procedure: INSERTION PORT-A-CATH;  Surgeon: Lori Medal, MD;  Location: [facility];  Service: General;  Laterality: Right;  . Wisdom tooth extraction    . Dilitation & currettage/hystroscopy with hydrothermal ablation N/A 09/02/2014    Procedure: DILATATION & CURETTAGE/HYSTEROSCOPY WITH HYDROTHERMAL ABLATION;  Surgeon: Lori Queen, MD;  Location: [facility];  Service: Gynecology;  Laterality: N/A;    FAMILY HISTORY Family History  Problem Relation Age of Onset  . Stroke Mother   . Diabetes Father   . Diabetes type I Father   . Colon cancer Maternal Grandmother 33  . Healthy Sister   . Healthy Brother   . Pulmonary embolism Maternal Uncle   . Colon cancer Paternal Uncle 24  . Stomach cancer Maternal Grandfather 94    pipe smoker  . Diabetes type I Paternal Grandmother   . Lung cancer Paternal Grandfather   . Heart Problems Paternal Uncle    the patient's father died at the age of 53 in a car accident. The patient's mother died at the age of 48 from a stroke. The patient has one adopted brother. She has one biological sister. The patient's mother's mother was diagnosed with colon cancer at the age of 10. There is no history of breast oropharynx cancer in the family to the patient's knowledge.  She asked about her daughter needing testing for brca and i said no.  GYNECOLOGIC HISTORY:   (Reviewed 11/15/2013) Menarche age 28. First live birth age 35. The patient is GX P1. She took birth control pills for approximately 12 years remotely. She had a period in February of 2014 and then in December of  2014. She had been started on hormone replacement in December. That has since been discontinued.  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 Her husband died at [place] with soft tissue sarcoma. The patient's significant other [name] also works as a Geophysicist/field seismologist. At home it is just the patient, her daughter Radene Gunning, Ander Gaster, and Richardson Landry. The patient has 2 stepchildren from her earlier marriage. [name] is a Games developer in [place] (masc.) studies criminal Justice.    ADVANCED DIRECTIVES: she has named [name] as her healthcare power of attorney. He can be reached at [place]:  (Updated 11/15/2013) Social History  Substance Use Topics  . Smoking status: Never Smoker   . Smokeless tobacco: Never Used  . Alcohol Use: Yes     Comment: 5-7 glasses per week of red  wine     Colonoscopy: July 2014  PAP: November 2014  Bone density: November 2014  Lipid panel: Not on file  Allergies  Allergen Reactions  . Compazine [Prochlorperazine] Other (See Comments)    Extreme restlessness and mild extrapyramidal movement;  Patient tolerates Reglan with no problems    Current Outpatient Prescriptions  Medication Sig Dispense Refill  . citalopram (CELEXA) 10 MG tablet Take 10 mg by mouth daily.    . Estradiol 10 MCG TABS vaginal tablet Place 1 tablet (10 mcg total) vaginally 3 (three) times a week. (Patient not taking: Reported on 11/16/2014) 24 tablet 12  . gabapentin (NEURONTIN) 300 MG capsule Take 1 capsule (300 mg total) by mouth 3 (three) times daily. Take with 126m tab to equal 4024mtid (Patient taking differently: Take 300 mg by mouth at bedtime. ) 90 capsule 4  . levothyroxine (SYNTHROID, LEVOTHROID) 112 MCG tablet Take 112 mcg by mouth daily before breakfast.     . LORazepam (ATIVAN) 0.5 MG tablet take 1 tablet by mouth twice  a day if needed for anxiety 60 tablet 0  . naproxen sodium (ANAPROX) 220 MG tablet Take 220 mg by mouth 2 (two) times daily as needed.    . tamoxifen (NOLVADEX) 20 MG tablet Take 1 tablet (20 mg total) by mouth daily. (Patient taking differently: Take 20 mg by mouth every morning. ) 90 tablet 4   No current facility-administered medications for this visit.    OBJECTIVE: Middle-aged white woman who appears well  Filed Vitals:   09/28/15 1541  BP: 144/73  Pulse: 70  Temp: 98 F (36.7 C)  Resp: 18   Body mass index is 32.55 kg/(m^2).    ECOG FS: 0 Filed Weights   09/28/15 1541  Weight: 214 lb (97.07 kg)    Sclerae unicteric, EOMs intact Oropharynx clear, dentition in good repair No cervical or supraclavicular adenopathy Lungs no rales or rhonchi Heart regular rate and rhythm Abd soft, nontender, positive bowel sounds MSK no focal spinal tenderness, no upper extremity lymphedema Neuro: nonfocal, well oriented, appropriate affect Breasts: The right breast is unremarkable. On the left the breast is status post lumpectomy and radiation. There is no evidence of local recurrence. The left axilla is benign.  LAB RESULTS: CBC    Component Value Date/Time   WBC 4.7 09/28/2015 1448   WBC 4.5 08/30/2014 0950   RBC 4.08 09/28/2015 1448   RBC 3.86* 08/30/2014 0950   HGB 12.1 09/28/2015 1448   HGB 11.4* 08/30/2014 0950   HCT 37.2 09/28/2015 1448   HCT 35.1* 08/30/2014 0950   PLT 167 09/28/2015 1448   PLT 160 08/30/2014 0950   MCV 91.2 09/28/2015 1448   MCV 90.9 08/30/2014 0950   MCH 29.7 09/28/2015 1448   MCH 29.5 08/30/2014 0950   MCHC 32.5 09/28/2015 1448   MCHC 32.5 08/30/2014 0950   RDW 14.8* 09/28/2015 1448   RDW 14.4 08/30/2014 0950   LYMPHSABS 1.8 09/28/2015 1448   MONOABS 0.5 09/28/2015 1448   EOSABS 0.2 09/28/2015 1448   BASOSABS 0.0 09/28/2015 1448    CMP     Component Value Date/Time   NA 139 09/28/2015 1448   NA 140 08/30/2014 0950   K 3.9 09/28/2015 1448    K 3.6 08/30/2014 0950   CL 106 08/30/2014 0950   CO2 28 09/28/2015 1448   CO2 26 08/30/2014 0950   GLUCOSE 112 09/28/2015 1448   GLUCOSE 102* 08/30/2014 0950   BUN 12.2 09/28/2015 1448  BUN 17 08/30/2014 0950   CREATININE 0.8 09/28/2015 1448   CREATININE 0.82 08/30/2014 0950   CALCIUM 9.3 09/28/2015 1448   CALCIUM 8.8 08/30/2014 0950   PROT 6.7 09/28/2015 1448   ALBUMIN 4.0 09/28/2015 1448   AST 31 09/28/2015 1448   ALT 36 09/28/2015 1448   ALKPHOS 67 09/28/2015 1448   BILITOT 0.47 09/28/2015 1448   GFRNONAA 79* 08/30/2014 0950   GFRAA >90 08/30/2014 0950    STUDIES: CLINICAL DATA: 60 year old female with history of left breast cancer post chemotherapy, lumpectomy 07/26/2013, and radiation therapy. Patient states she has had some itching involving her left areola. Patient denies any discrete palpable abnormalities.  EXAM: DIGITAL DIAGNOSTIC BILATERAL MAMMOGRAM WITH 3D TOMOSYNTHESIS AND CAD  COMPARISON: Previous exam(s).  ACR Breast Density Category c: The breast tissue is heterogeneously dense, which may obscure small masses.  FINDINGS: No suspicious masses or calcifications are seen in either breast. Postsurgical changes are present in the central posterior left breast related to prior lumpectomy. A spot compression magnification MLO view of the lumpectomy site in the left breast was performed. There is no mammographic evidence of locally recurrent malignancy.  Mammographic images were processed with CAD.  IMPRESSION: No mammographic evidence of malignancy in either breast.  RECOMMENDATION: 1. Diagnostic mammogram is suggested in 1 year. (Code:DM-B-01Y)  2. Recommend patient apply over the counter hydrocortisone cream to the area of pruritis in the left breast to see if this helps with her symptoms.  I have discussed the findings and recommendations with the patient. Results were also provided in writing at the conclusion of the visit. If  applicable, a reminder letter will be sent to the patient regarding the next appointment.  BI-RADS CATEGORY 2: Benign.   Electronically Signed  By: Everlean Alstrom M.D.  On: 06/06/2015 11:59   ASSESSMENT: 60 y.o. [place] woman, status post left breast biopsy 06/03/2013 for a clinical T2 N0, stage IIA invasive ductal carcinoma, grade 3, estrogen and progesterone receptor positive, with an MIB-1 of 44%, and HER-2 amplified.  (1) status post left lumpectomy and sentinel lymph node sampling 07/26/2013 for a pT2 pN0, stage IIA invasive ductal carcinoma, grade 3, with negative margins.  (2) treated in the adjuvant setting with docetaxel/carboplatin (AUC 5) given along with trastuzumab/pertuzumab x6 cycles, first dose on 09/02/2013, last dose 12/16/2013  (3) trastuzumab continued through through March 2016  (4) adjuvant radiation completed 03/02/2014  (5) tamoxifen started 04/26/2014  (6)  Depression/anxiety - on citalopram  (7) chemotherapy-induced anemia -- resolved  (8) vaginal dryness/dyspareunia: s/p MonaLisa procedure; on vaginal estrogens  (9)   Genetics testing 02/14/2015  Through thecustom cancer gene panel offered by [facility] i found no deleterious mutations in ATM, BARD1, BRCA1, BRCA2, BRIP1, CDH1, CHEK2, EPCAM, MLH1, MRE11A, MSH2, MSH6, MUTYH, NBN, NF1, PALB2, PMS2, PTEN, RAD50, RAD51C, RAD51D, SMARCA4, STK11, and TP53.    PLAN: Kenyia has made an excellent recovery from her treatments and is thriving. She is tolerating the tamoxifen well and she is using the vaginal estradiol tablets appropriately. I think she could do even a little bit better if she participated in our intimacy and pelvic health program and I have gone ahead and given her information on that.  The stiffness she feels I think is within normal range. If she feels morning stiffness beyond one hour then I would refer her to rheumatology. As for her restless legs I suggested she do some stretching  before she goes to bed and also considered drinking a couple of tonic water,  which will both hydrate her and gave her a little bit of quinine. I don't know how to explain her internal shiversas she calls them. I think she must have a nerve or 2 that gets irritated and causes a little bit of muscle fibrillation in that area. I suggested next time it happens she can put pressure on it and hold.  The feelings in her right upper quadrant though I think may indeed be mild cholecystitis. I suggested she watch what happens when she has a fatty meal. If she has significant discomfort or nausea then she should see Dr. Lucia Gaskins her surgeon and consider cholecystectomy. Finally, as far as the itching is concerned on her left breast, that is going to be due to continuing inflammation there. I suggested aside from creams she could simply use ice for temporary relief.  As for her neuropathy, I suggested she up her gabapentin to 600 mg at bedtime. She will back off if it makes her sleepy in the morning.  Otherwise she is going to see me again in February 2018, after her January mammograms. She knows to call for any problems that may develop before her next visit here.  Chauncey Cruel, MD   09/28/2015 4:09 PM

## 2015-09-29 MED ORDER — TAMOXIFEN CITRATE 20 MG PO TABS: 20.0000 mg | ORAL_TABLET | Freq: Every day | ORAL | Status: DC

## 2015-09-29 MED ORDER — GABAPENTIN 300 MG PO CAPS: 600.0000 mg | ORAL_CAPSULE | Freq: Every day | ORAL | Status: DC

## 2016-01-18 DIAGNOSIS — F4323 Adjustment disorder with mixed anxiety and depressed mood: Secondary | ICD-10-CM | POA: Diagnosis not present

## 2016-03-04 ENCOUNTER — Other Ambulatory Visit: Payer: Self-pay | Admitting: Oncology

## 2016-03-04 DIAGNOSIS — Z9889 Other specified postprocedural states: Secondary | ICD-10-CM

## 2016-03-04 DIAGNOSIS — Z853 Personal history of malignant neoplasm of breast: Secondary | ICD-10-CM

## 2016-04-05 DIAGNOSIS — Z23 Encounter for immunization: Secondary | ICD-10-CM | POA: Diagnosis not present

## 2016-05-21 ENCOUNTER — Other Ambulatory Visit: Payer: Self-pay | Admitting: Nurse Practitioner

## 2016-06-06 ENCOUNTER — Ambulatory Visit
Admission: RE | Admit: 2016-06-06 | Discharge: 2016-06-06 | Disposition: A | Discharge status: Home / Self Care | Payer: BLUE CROSS/BLUE SHIELD | Source: Ambulatory Visit | Attending: Oncology | Admitting: Oncology

## 2016-06-06 DIAGNOSIS — Z6834 Body mass index (BMI) 34.0-34.9, adult: Secondary | ICD-10-CM | POA: Diagnosis not present

## 2016-06-06 DIAGNOSIS — Z853 Personal history of malignant neoplasm of breast: Secondary | ICD-10-CM

## 2016-06-06 DIAGNOSIS — R922 Inconclusive mammogram: Secondary | ICD-10-CM | POA: Diagnosis not present

## 2016-06-06 DIAGNOSIS — R140 Abdominal distension (gaseous): Secondary | ICD-10-CM | POA: Diagnosis not present

## 2016-06-06 DIAGNOSIS — Z9889 Other specified postprocedural states: Secondary | ICD-10-CM

## 2016-06-06 DIAGNOSIS — E6609 Other obesity due to excess calories: Secondary | ICD-10-CM | POA: Diagnosis not present

## 2016-06-06 DIAGNOSIS — Z01419 Encounter for gynecological examination (general) (routine) without abnormal findings: Secondary | ICD-10-CM | POA: Diagnosis not present

## 2016-06-17 DIAGNOSIS — R1011 Right upper quadrant pain: Secondary | ICD-10-CM | POA: Diagnosis not present

## 2016-06-24 DIAGNOSIS — R1011 Right upper quadrant pain: Secondary | ICD-10-CM | POA: Diagnosis not present

## 2016-07-02 DIAGNOSIS — M2550 Pain in unspecified joint: Secondary | ICD-10-CM | POA: Diagnosis not present

## 2016-07-02 DIAGNOSIS — M79672 Pain in left foot: Secondary | ICD-10-CM | POA: Diagnosis not present

## 2016-07-02 DIAGNOSIS — M25562 Pain in left knee: Secondary | ICD-10-CM | POA: Diagnosis not present

## 2016-07-02 DIAGNOSIS — M25561 Pain in right knee: Secondary | ICD-10-CM | POA: Diagnosis not present

## 2016-07-08 ENCOUNTER — Other Ambulatory Visit: Payer: Self-pay | Admitting: Emergency Medicine

## 2016-07-08 DIAGNOSIS — Z86010 Personal history of colonic polyps: Secondary | ICD-10-CM | POA: Diagnosis not present

## 2016-07-08 DIAGNOSIS — C50512 Malignant neoplasm of lower-outer quadrant of left female breast: Secondary | ICD-10-CM

## 2016-07-08 DIAGNOSIS — K219 Gastro-esophageal reflux disease without esophagitis: Secondary | ICD-10-CM | POA: Diagnosis not present

## 2016-07-08 DIAGNOSIS — K2930 Chronic superficial gastritis without bleeding: Secondary | ICD-10-CM | POA: Diagnosis not present

## 2016-07-08 DIAGNOSIS — Z1211 Encounter for screening for malignant neoplasm of colon: Secondary | ICD-10-CM | POA: Diagnosis not present

## 2016-07-08 DIAGNOSIS — Z800 Family history of malignant neoplasm of digestive organs: Secondary | ICD-10-CM | POA: Diagnosis not present

## 2016-07-09 ENCOUNTER — Ambulatory Visit (HOSPITAL_BASED_OUTPATIENT_CLINIC_OR_DEPARTMENT_OTHER): Payer: BLUE CROSS/BLUE SHIELD | Admitting: Oncology

## 2016-07-09 ENCOUNTER — Other Ambulatory Visit (HOSPITAL_BASED_OUTPATIENT_CLINIC_OR_DEPARTMENT_OTHER): Payer: BLUE CROSS/BLUE SHIELD

## 2016-07-09 VITALS — BP 139/65 | HR 79 | Temp 98.4°F | Resp 18 | Ht 68.0 in | Wt 212.9 lb

## 2016-07-09 DIAGNOSIS — C50812 Malignant neoplasm of overlapping sites of left female breast: Secondary | ICD-10-CM

## 2016-07-09 DIAGNOSIS — Z79810 Long term (current) use of selective estrogen receptor modulators (SERMs): Secondary | ICD-10-CM

## 2016-07-09 DIAGNOSIS — C50512 Malignant neoplasm of lower-outer quadrant of left female breast: Secondary | ICD-10-CM

## 2016-07-09 DIAGNOSIS — Z170 Estrogen receptor positive status [ER+]: Secondary | ICD-10-CM | POA: Diagnosis not present

## 2016-07-09 DIAGNOSIS — R140 Abdominal distension (gaseous): Secondary | ICD-10-CM | POA: Diagnosis not present

## 2016-07-09 DIAGNOSIS — Z853 Personal history of malignant neoplasm of breast: Secondary | ICD-10-CM | POA: Diagnosis not present

## 2016-07-09 LAB — CBC WITH DIFFERENTIAL/PLATELET
BASO%: 0.5 % (ref 0.0–2.0)
BASOS ABS: 0 10*3/uL (ref 0.0–0.1)
EOS ABS: 0.2 10*3/uL (ref 0.0–0.5)
EOS%: 2.9 % (ref 0.0–7.0)
HCT: 37.7 % (ref 34.8–46.6)
HGB: 12.4 g/dL (ref 11.6–15.9)
LYMPH%: 32.5 % (ref 14.0–49.7)
MCH: 29.2 pg (ref 25.1–34.0)
MCHC: 32.9 g/dL (ref 31.5–36.0)
MCV: 88.7 fL (ref 79.5–101.0)
MONO#: 0.4 10*3/uL (ref 0.1–0.9)
MONO%: 7.2 % (ref 0.0–14.0)
NEUT%: 56.9 % (ref 38.4–76.8)
NEUTROS ABS: 3.1 10*3/uL (ref 1.5–6.5)
PLATELETS: 172 10*3/uL (ref 145–400)
RBC: 4.25 10*6/uL (ref 3.70–5.45)
RDW: 14.5 % (ref 11.2–14.5)
WBC: 5.5 10*3/uL (ref 3.9–10.3)
lymph#: 1.8 10*3/uL (ref 0.9–3.3)

## 2016-07-09 LAB — COMPREHENSIVE METABOLIC PANEL
ALT: 27 U/L (ref 0–55)
AST: 23 U/L (ref 5–34)
Albumin: 3.7 g/dL (ref 3.5–5.0)
Alkaline Phosphatase: 85 U/L (ref 40–150)
Anion Gap: 10 mEq/L (ref 3–11)
BILIRUBIN TOTAL: 0.45 mg/dL (ref 0.20–1.20)
BUN: 13.1 mg/dL (ref 7.0–26.0)
CO2: 26 meq/L (ref 22–29)
Calcium: 9.2 mg/dL (ref 8.4–10.4)
Chloride: 104 mEq/L (ref 98–109)
Creatinine: 0.9 mg/dL (ref 0.6–1.1)
EGFR: 73 mL/min/{1.73_m2} — AB (ref >90)
Glucose: 203 mg/dl — ABNORMAL HIGH (ref 70–140)
POTASSIUM: 3.4 meq/L — AB (ref 3.5–5.1)
Sodium: 139 mEq/L (ref 136–145)
TOTAL PROTEIN: 6.7 g/dL (ref 6.4–8.3)

## 2016-07-09 NOTE — Progress Notes (Signed)
[place]  [phone] [phone]     ID: Lori Vincent DOB: 02/16/1956  MR#: [id number]  DXA#:[id number]  PCP: Lori Fusi, NP GYN:  Lori Posey, MD SU: Lori Overall, MD OTHER MD: Lori Koh, MD  CHIEF COMPLAINT: estrogen receptor positive breast cancer  CURRENT TREATMENT: tamoxifen   BREAST CANCER HISTORY: From the original intake note:  Lori Vincent [facility] showing a possible mass in the left breast. Diagnostic left mammography and ultrasonography at the breast center 04/26/2013 confirmed an oval mass with microcalcifications in the 6:00 position of the left breast. There was an adjacent partially obscured 1.2 cm mass. On physical exam there was no palpable mass in the left breast. By ultrasonography, there was an oval mass at the 6:00 position measuring 1.4 cm, with a second mass measuring 1.1 cm. There were no abnormal lymph nodes noted in the left axilla.  Biopsy of the 6:00 mass in the left breast January 2015 showed ([id number]) and invasive ductal carcinoma, grade 3, estrogen receptor 97% positive, progesterone receptor 75% positive, with strong staining intensity, and MIB-144%. HER-2 was amplified, with the signals ratio 4.18 and in number per cell of 9.40 .  On 06/08/2013 the patient underwent bilateral breast MRI which showed a dumbbell-shaped area in the central left breast consisting of a 2 cm mass and 2 extensions or an adjacent masses, measuring a total of 2.9 cm. There were no abnormal appearing lymph nodes for any other areas of concern.  The patient's subsequent history is as detailed below.  INTERVAL HISTORY: Lori Vincent returns today for follow-up of her estrogen receptor positive breast cancer. She continues on tamoxifen, with good tolerance. Hot flashes and vaginal wetness are not major issues for her.  However she tells me she recently had an ultrasound of the uterus  through Dr. Gracy Vincent office and that this showed recurrent endometrial hyperplasia even though she is status post ablation procedures. Dr. Runell Vincent has recommended hysterectomy with ancillary bilateral salpingo-oophorectomy.  REVIEW OF SYSTEMS: Quite aside from the uterine and tamoxifen issues, she has been evaluated by Dr. Thana Vincent and had a colonoscopy yesterday. This went "great". EGD at the same time reportedly showed some gastric inflammation with biopsies pending. More importantly he obtained an ultrasound of the gallbladder which appears to show a polyp in the gallbladder neck. This would account for the occasional symptoms of vague nausea, bloating and filling, that she feels in which she expressed last year also at that visit. Aside from these problems she has mild arthritis pains here and there, and the skin of her left breast remains itchy, although there are no lesions there that she is aware of. A detailed review of systems today was otherwise Vincent  PAST MEDICAL HISTORY: Past Medical History:  Diagnosis Date  . Anemia due to chemotherapy   . Anxiety   . Arthritis    knees and feet  . Breast cancer 06/03/13   left, 6:00 o'clock  . Depression   . Family history of colon cancer   . History of blood transfusion 12/2013   [facility] - 2 units transfused - during chemo  . History of radiation therapy 01/18/14- 03/02/14   left breast 4680 cGy 26 sessions, boost of 1000 cGy 5 sessions  . Hypokalemia    during with chemo 08/2013  . Hypothyroidism     PAST SURGICAL HISTORY: Past Surgical History:  Procedure Laterality Date  . BREAST BIOPSY  1/14   lt  .  BREAST LUMPECTOMY WITH NEEDLE LOCALIZATION AND AXILLARY SENTINEL LYMPH NODE BX Left 07/26/2013   Procedure: BREAST LUMPECTOMY WITH NEEDLE LOCALIZATION AND AXILLARY SENTINEL LYMPH NODE BX;  Surgeon: Lori Medal, MD;  Location: [facility];  Service: General;  Laterality: Left;  . CESAREAN SECTION  04/1999  . COLONOSCOPY  2013  .  DILATION AND CURETTAGE OF UTERUS    . DILITATION & CURRETTAGE/HYSTROSCOPY WITH HYDROTHERMAL ABLATION N/A 09/02/2014   Procedure: DILATATION & CURETTAGE/HYSTEROSCOPY WITH HYDROTHERMAL ABLATION;  Surgeon: Lori Queen, MD;  Location: [facility];  Service: Gynecology;  Laterality: N/A;  . MOLE REMOVAL Right 07/26/2013   Procedure: MOLE REMOVAL UNDER RIGHT BREAST;  Surgeon: Lori Medal, MD;  Location: [facility];  Service: General;  Laterality: Right;  . PORTACATH PLACEMENT Right 07/26/2013   Procedure: INSERTION PORT-A-CATH;  Surgeon: Lori Medal, MD;  Location: [facility];  Service: General;  Laterality: Right;  . TONSILLECTOMY  1972  . WISDOM TOOTH EXTRACTION      FAMILY HISTORY Family History  Problem Relation Age of Onset  . Stroke Mother   . Diabetes Father   . Diabetes type I Father   . Colon cancer Maternal Grandmother 53  . Healthy Sister   . Healthy Brother   . Pulmonary embolism Maternal Uncle   . Colon cancer Paternal Uncle 15  . Stomach cancer Maternal Grandfather 94    pipe smoker  . Diabetes type I Paternal Grandmother   . Lung cancer Paternal Grandfather   . Heart Problems Paternal Uncle    the patient's father died at the age of 48 in a car accident. The patient's mother died at the age of 37 from a stroke. The patient has one adopted brother. She has one biological sister. The patient's mother's mother was diagnosed with colon cancer at the age of 21. There is no history of breast oropharynx cancer in the family to the patient's knowledge.  She asked about her daughter needing testing for brca and i said no.  GYNECOLOGIC HISTORY:   (Reviewed 11/15/2013) Menarche age 17. First live birth age 5. The patient is GX P1. She took birth control pills for approximately 12 years remotely. She had a period in February of 2014 and then in December of 2014. She had been started on hormone replacement in December. That has since been  discontinued.  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 Her husband died at Lori Vincent with soft tissue sarcoma. The patient's significant other Lori Vincent also works as a Geophysicist/field seismologist. At home it is just the patient, her daughter Radene Gunning, Ander Gaster, and Richardson Landry. The patient has 2 stepchildren from her earlier marriage. [name] is a Games developer in [place] and [place] (masc.) studies criminal Justice.    ADVANCED DIRECTIVES: she has named Lori Vincent as her healthcare power of attorney. He can be reached at [place]:  (Updated 11/15/2013) Social History  Substance Use Topics  . Smoking status: Never Smoker  . Smokeless tobacco: Never Used  . Alcohol use Yes     Comment: 5-7 glasses per week of red wine     Colonoscopy: July 2014  PAP: November 2014  Bone density: November 2014  Lipid panel: Not on file  Allergies  Allergen Reactions  . Compazine [Prochlorperazine] Other (See Comments)    Extreme restlessness and mild extrapyramidal movement;  Patient tolerates Reglan with no problems    Current Outpatient Prescriptions  Medication Sig Dispense Refill  . citalopram (CELEXA) 10 MG tablet Take 10 mg by mouth daily.    . Estradiol 10 MCG TABS vaginal tablet Place 1 tablet (10 mcg total) vaginally 3 (three) times a week. (Patient not taking: Reported on 11/16/2014) 24 tablet 12  . gabapentin (NEURONTIN) 300 MG capsule Take 2 capsules (600 mg total) by mouth at bedtime. Take with 161m tab to equal 4058mtid 90 capsule 4  . levothyroxine (SYNTHROID, LEVOTHROID) 112 MCG tablet Take 112 mcg by mouth daily before breakfast.     . LORazepam (ATIVAN) 0.5 MG tablet take 1 tablet by mouth twice a day if needed for anxiety 60 tablet 0  . naproxen sodium (ANAPROX) 220 MG tablet Take 220 mg by mouth 2 (two) times daily as needed.    . tamoxifen  (NOLVADEX) 20 MG tablet Take 1 tablet (20 mg total) by mouth daily. 90 tablet 4   No current facility-administered medications for this visit.     OBJECTIVE: Middle-aged white woman In no acute distress Vitals:   07/09/16 1513  BP: 139/65  Pulse: 79  Resp: 18  Temp: 98.4 F (36.9 C)   Body mass index is 32.37 kg/m.    ECOG FS: 0 Filed Weights   07/09/16 1513  Weight: 212 lb 14.4 oz (96.6 kg)    Sclerae unicteric, pupils round and equal Oropharynx clear and moist-- no thrush or other lesions No cervical or supraclavicular adenopathy Lungs no rales or rhonchi Heart regular rate and rhythm Abd soft, obese, nontender, positive bowel sounds MSK no focal spinal tenderness, no upper extremity lymphedema Neuro: nonfocal, well oriented, appropriate affect Breasts: The right breast is benign. Left breast is status post lumpectomy and radiation. There is a minimal duskiness over the breast which is unchanged from prior. There are no palpable masses. Both axillae are benign.   LAB RESULTS: CBC    Component Value Date/Time   WBC 5.5 07/09/2016 1443   WBC 4.5 08/30/2014 0950   RBC 4.25 07/09/2016 1443   RBC 3.86 (L) 08/30/2014 0950   HGB 12.4 07/09/2016 1443   HCT 37.7 07/09/2016 1443   PLT 172 07/09/2016 1443   MCV 88.7 07/09/2016 1443   MCH 29.2 07/09/2016 1443   MCH 29.5 08/30/2014 0950   MCHC 32.9 07/09/2016 1443   MCHC 32.5 08/30/2014 0950   RDW 14.5 07/09/2016 1443   LYMPHSABS 1.8 07/09/2016 1443   MONOABS 0.4 07/09/2016 1443   EOSABS 0.2 07/09/2016 1443   BASOSABS 0.0 07/09/2016 1443    CMP     Component Value Date/Time   NA 139 07/09/2016 1443   K 3.4 (L) 07/09/2016 1443   CL 106 08/30/2014 0950   CO2 26 07/09/2016 1443   GLUCOSE 203 (H) 07/09/2016 1443   BUN 13.1 07/09/2016 1443   CREATININE 0.9 07/09/2016 1443   CALCIUM 9.2 07/09/2016 1443   PROT 6.7 07/09/2016 1443   ALBUMIN 3.7 07/09/2016 1443   AST 23 07/09/2016 1443   ALT 27 07/09/2016 1443    ALKPHOS 85 07/09/2016 1443   BILITOT 0.45 07/09/2016 1443   GFRNONAA 79 (L) 08/30/2014 0950   GFRAA >90 08/30/2014 0950    STUDIES: CLINICAL DATA:  [age]ear old female presenting for routine annual follow-up status post left breast lumpectomy in 2015.  EXAM: 2D DIGITAL DIAGNOSTIC BILATERAL MAMMOGRAM WITH CAD AND ADJUNCT TOMO  COMPARISON:  Previous exam(s).  ACR Breast Density Category c:  The breast tissue is heterogeneously dense, which may obscure small masses.  FINDINGS: The lumpectomy site in the central left breast appears mammographically Vincent. No suspicious calcifications, masses or areas of distortion are seen in the bilateral breasts.  Mammographic images were processed with CAD.  IMPRESSION: Vincent left breast lumpectomy site. No mammographic evidence of malignancy in the bilateral breasts.  RECOMMENDATION: Diagnostic mammogram is suggested in 1 year. (Code:DM-B-01Y)  I have discussed the findings and recommendations with the patient. Results were also provided in writing at the conclusion of the visit. If applicable, a reminder letter will be sent to the patient regarding the next appointment.  BI-RADS CATEGORY  2: Benign.   Electronically Signed   By: Ammie Ferrier M.D.   On: 06/06/2016 09:47   ASSESSMENT: 61 y.o. [place] woman, status post left breast biopsy 06/03/2013 for a clinical T2 N0, stage IIA invasive ductal carcinoma, grade 3, estrogen and progesterone receptor positive, with an MIB-1 of 44%, and HER-2 amplified.  (1) status post left lumpectomy and sentinel lymph node sampling 07/26/2013 for a pT2 pN0, stage IIA invasive ductal carcinoma, grade 3, with negative margins.  (2) treated in the adjuvant setting with docetaxel/carboplatin (AUC 5) given along with trastuzumab/pertuzumab x6 cycles, first dose on 09/02/2013, last dose 12/16/2013  (3) trastuzumab continued through through March 2016  (4) adjuvant radiation completed  03/02/2014  (5) tamoxifen started 04/26/2014  (6)  Depression/anxiety - on citalopram  (7) chemotherapy-induced anemia -- resolved  (8) vaginal dryness/dyspareunia: s/p MonaLisa procedure; on vaginal estrogens  (9)   Genetics testing 02/14/2015  Through thecustom cancer gene panel offered by [facility] i found no deleterious mutations in ATM, BARD1, BRCA1, BRCA2, BRIP1, CDH1, CHEK2, EPCAM, MLH1, MRE11A, MSH2, MSH6, MUTYH, NBN, NF1, PALB2, PMS2, PTEN, RAD50, RAD51C, RAD51D, SMARCA4, STK11, and TP53.    PLAN: Tawyna is now nearly 3 years out from definitive surgery for her breast cancer with no evidence of disease recurrence. This is very favorable.   She continues on tamoxifen, with good tolerance. The big problem is continuing issues regarding endometrial hyperplasia. We discussed the fact that she can switch to an aromatase inhibitor for the next 3 years and if she did that she would not need a hysterectomy. That would take care of the hyperplasia problem, which is at least partly caused by tamoxifen and partly by the transvaginal estrogens she receives.  However if she goes on anastrozole she would not be able to continue to use vaginal estrogens. Unfortunately this is the only thing that really makes a big difference in this setting. She has already had the [place] lisa touch including at least 1 maintenance and for her, especially with a possible wedding later this year, optimal management of vaginal atrophy problems is crucial. Accordingly she would prefer to undergo hysterectomy so she can continue safely using the vaginal estrogens. I have no problem with this decision.  If she is going to have a uterus removed I see no reason for her not to also have her ovaries removed at the same time.  A more complicated issue is management of what seems to be a gallbladder neck polyp. I doubt that this is going to turn out to be cancer but of course there is always some possibility of that. More  importantly this problem was present a year ago when I last saw her and it continues to give her trouble. The only solution and I can think of for this is cholecystectomy and she can discuss this in  more detail with Dr. Lucia Gaskins.  The issue is whether the cholecystectomy and hysterectomy could both be done at the same time with the same anesthesia.  At this time and then the plan is to continue tamoxifen. She is going to return to see me in a year. She knows to call for any problems that may develop before the next visit.  Chauncey Cruel, MD   07/09/2016 4:31 PM

## 2016-07-10 ENCOUNTER — Encounter (HOSPITAL_COMMUNITY): Payer: Self-pay

## 2016-07-13 ENCOUNTER — Telehealth: Payer: Self-pay | Admitting: Oncology

## 2016-07-13 NOTE — Telephone Encounter (Signed)
Lvm advising appt 07/09/17 @ 12.45pm. Also mailed calendar.

## 2016-07-15 ENCOUNTER — Other Ambulatory Visit (HOSPITAL_COMMUNITY): Payer: Self-pay | Admitting: Gastroenterology

## 2016-07-15 DIAGNOSIS — R9389 Abnormal findings on diagnostic imaging of other specified body structures: Secondary | ICD-10-CM

## 2016-07-18 ENCOUNTER — Ambulatory Visit: Payer: BLUE CROSS/BLUE SHIELD | Admitting: Oncology

## 2016-07-25 ENCOUNTER — Encounter (HOSPITAL_COMMUNITY)
Admission: RE | Admit: 2016-07-25 | Discharge: 2016-07-25 | Disposition: A | Discharge status: Home / Self Care | Payer: BLUE CROSS/BLUE SHIELD | Source: Ambulatory Visit | Attending: Gastroenterology | Admitting: Gastroenterology

## 2016-07-25 DIAGNOSIS — R938 Abnormal findings on diagnostic imaging of other specified body structures: Secondary | ICD-10-CM | POA: Insufficient documentation

## 2016-07-25 DIAGNOSIS — R1011 Right upper quadrant pain: Secondary | ICD-10-CM | POA: Diagnosis not present

## 2016-07-25 DIAGNOSIS — R9389 Abnormal findings on diagnostic imaging of other specified body structures: Secondary | ICD-10-CM

## 2016-07-25 MED ORDER — SINCALIDE 5 MCG IJ SOLR
0.0200 ug/kg | Freq: Once | INTRAMUSCULAR | Status: AC
  Administered 2016-07-25: 1.9 ug via INTRAVENOUS

## 2016-07-25 MED ORDER — TECHNETIUM TC 99M MEBROFENIN IV KIT
5.0000 | PACK | Freq: Once | INTRAVENOUS | Status: AC | PRN
  Administered 2016-07-25: 5 via INTRAVENOUS

## 2016-09-10 DIAGNOSIS — Z1389 Encounter for screening for other disorder: Secondary | ICD-10-CM | POA: Diagnosis not present

## 2016-09-10 DIAGNOSIS — M791 Myalgia: Secondary | ICD-10-CM | POA: Diagnosis not present

## 2016-09-10 DIAGNOSIS — R7302 Impaired glucose tolerance (oral): Secondary | ICD-10-CM | POA: Diagnosis not present

## 2016-09-10 DIAGNOSIS — E038 Other specified hypothyroidism: Secondary | ICD-10-CM | POA: Diagnosis not present

## 2016-09-10 DIAGNOSIS — M6281 Muscle weakness (generalized): Secondary | ICD-10-CM | POA: Diagnosis not present

## 2016-09-16 DIAGNOSIS — E039 Hypothyroidism, unspecified: Secondary | ICD-10-CM | POA: Diagnosis not present

## 2016-09-18 DIAGNOSIS — E039 Hypothyroidism, unspecified: Secondary | ICD-10-CM | POA: Diagnosis not present

## 2016-09-19 ENCOUNTER — Other Ambulatory Visit: Payer: Self-pay | Admitting: Endocrinology

## 2016-09-19 DIAGNOSIS — E010 Iodine-deficiency related diffuse (endemic) goiter: Secondary | ICD-10-CM

## 2016-09-24 NOTE — Patient Instructions (Signed)
Your procedure is scheduled on:  Monday, Sep 30, 2016  Enter through the [employer] [facility] at:  6:00 AM  Pick up the phone at the desk and dial [phone].  Call this number if you have problems the morning of surgery: [phone].  Remember: Do NOT eat food or drink after:  Midnight Sunday  Take these medicines the morning of surgery with a SIP OF WATER:  Citalopram, Levothyroxine, Tamoxifen  Stop ALL herbal medications at this time  Do NOT smoke the day of surgery.  Do NOT wear jewelry (body piercing), metal hair clips/bobby pins, make-up, or nail polish. Do NOT wear lotions, powders, or perfumes.  You may wear deodorant. Do NOT shave for 48 hours prior to surgery. Do NOT bring valuables to the hospital. Contacts, dentures, or bridgework may not be worn into surgery.  Leave suitcase in car.  After surgery it may be brought to your room.  For patients admitted to the hospital, checkout time is 11:00 AM the day of discharge.  Bring a copy of your healthcare power of attorney and living will documents.

## 2016-09-25 ENCOUNTER — Encounter (HOSPITAL_COMMUNITY): Payer: Self-pay

## 2016-09-25 ENCOUNTER — Encounter (HOSPITAL_COMMUNITY)
Admission: RE | Admit: 2016-09-25 | Discharge: 2016-09-25 | Disposition: A | Discharge status: Home / Self Care | Payer: BLUE CROSS/BLUE SHIELD | Source: Ambulatory Visit | Attending: Obstetrics and Gynecology | Admitting: Obstetrics and Gynecology

## 2016-09-25 DIAGNOSIS — R600 Localized edema: Secondary | ICD-10-CM | POA: Insufficient documentation

## 2016-09-25 DIAGNOSIS — E876 Hypokalemia: Secondary | ICD-10-CM | POA: Insufficient documentation

## 2016-09-25 DIAGNOSIS — R790 Abnormal level of blood mineral: Secondary | ICD-10-CM | POA: Diagnosis not present

## 2016-09-25 DIAGNOSIS — C50512 Malignant neoplasm of lower-outer quadrant of left female breast: Secondary | ICD-10-CM | POA: Insufficient documentation

## 2016-09-25 DIAGNOSIS — Z800 Family history of malignant neoplasm of digestive organs: Secondary | ICD-10-CM | POA: Diagnosis not present

## 2016-09-25 DIAGNOSIS — Z01812 Encounter for preprocedural laboratory examination: Secondary | ICD-10-CM | POA: Insufficient documentation

## 2016-09-25 DIAGNOSIS — K649 Unspecified hemorrhoids: Secondary | ICD-10-CM | POA: Diagnosis not present

## 2016-09-25 DIAGNOSIS — Z923 Personal history of irradiation: Secondary | ICD-10-CM | POA: Insufficient documentation

## 2016-09-25 DIAGNOSIS — F419 Anxiety disorder, unspecified: Secondary | ICD-10-CM | POA: Insufficient documentation

## 2016-09-25 DIAGNOSIS — R197 Diarrhea, unspecified: Secondary | ICD-10-CM | POA: Diagnosis not present

## 2016-09-25 DIAGNOSIS — Z170 Estrogen receptor positive status [ER+]: Secondary | ICD-10-CM | POA: Diagnosis not present

## 2016-09-25 DIAGNOSIS — R938 Abnormal findings on diagnostic imaging of other specified body structures: Secondary | ICD-10-CM | POA: Diagnosis not present

## 2016-09-25 DIAGNOSIS — N8500 Endometrial hyperplasia, unspecified: Secondary | ICD-10-CM | POA: Diagnosis not present

## 2016-09-25 DIAGNOSIS — D6481 Anemia due to antineoplastic chemotherapy: Secondary | ICD-10-CM | POA: Diagnosis not present

## 2016-09-25 DIAGNOSIS — K219 Gastro-esophageal reflux disease without esophagitis: Secondary | ICD-10-CM | POA: Diagnosis not present

## 2016-09-25 HISTORY — DX: Polyneuropathy, unspecified: G62.9

## 2016-09-25 LAB — CBC
HEMATOCRIT: 36.3 % (ref 36.0–46.0)
HEMOGLOBIN: 11.8 g/dL — AB (ref 12.0–15.0)
MCH: 28.9 pg (ref 26.0–34.0)
MCHC: 32.5 g/dL (ref 30.0–36.0)
MCV: 88.8 fL (ref 78.0–100.0)
Platelets: 187 10*3/uL (ref 150–400)
RBC: 4.09 MIL/uL (ref 3.87–5.11)
RDW: 14.8 % (ref 11.5–15.5)
WBC: 4.4 10*3/uL (ref 4.0–10.5)

## 2016-09-26 NOTE — H&P (Signed)
61 year old G 2 P 1 with history of left breast cancer on tamoxifen . She recently had thickened endometrium on ultrasound EMB results pending from last week.  Past Medical History:  Diagnosis Date  . Anemia due to chemotherapy   . Anxiety   . Arthritis    knees and feet  . Breast cancer ([place]) 06/03/13   left, 6:00 o'clock  . Family history of colon cancer   . History of blood transfusion 12/2013   [facility] - 2 units transfused - during chemo  . History of radiation therapy 01/18/14- 03/02/14   left breast 4680 cGy 26 sessions, boost of 1000 cGy 5 sessions  . Hypokalemia    during with chemo 08/2013  . Hypothyroidism   . Neuropathy    bottom of feet, mild   Past Surgical History:  Procedure Laterality Date  . BREAST BIOPSY  1/14   lt  . BREAST LUMPECTOMY WITH NEEDLE LOCALIZATION AND AXILLARY SENTINEL LYMPH NODE BX Left 07/26/2013   Procedure: BREAST LUMPECTOMY WITH NEEDLE LOCALIZATION AND AXILLARY SENTINEL LYMPH NODE BX;  Surgeon: Shann Medal, MD;  Location: [facility];  Service: General;  Laterality: Left;  . CESAREAN SECTION  04/1999  . COLONOSCOPY  2013  . DIAGNOSTIC LAPAROSCOPY     laser endometriosis  . DILATION AND CURETTAGE OF UTERUS    . DILITATION & CURRETTAGE/HYSTROSCOPY WITH HYDROTHERMAL ABLATION N/A 09/02/2014   Procedure: DILATATION & CURETTAGE/HYSTEROSCOPY WITH HYDROTHERMAL ABLATION;  Surgeon: Dian Queen, MD;  Location: [facility];  Service: Gynecology;  Laterality: N/A;  . MOLE REMOVAL Right 07/26/2013   Procedure: MOLE REMOVAL UNDER RIGHT BREAST;  Surgeon: Shann Medal, MD;  Location: [facility];  Service: General;  Laterality: Right;  . PORTACATH PLACEMENT Right 07/26/2013   Procedure: INSERTION PORT-A-CATH;  Surgeon: Shann Medal, MD;  Location: [facility];  Service: General;  Laterality: Right;  . TONSILLECTOMY  1972  . WISDOM TOOTH EXTRACTION     Prior to Admission medications   Medication Sig Start Date End Date Taking?  Authorizing Provider  citalopram (CELEXA) 10 MG tablet Take 10 mg by mouth daily.   Yes Historical Provider, MD  levothyroxine (SYNTHROID, LEVOTHROID) 100 MCG tablet Take 100 mcg by mouth daily before breakfast.   Yes Historical Provider, MD  LORazepam (ATIVAN) 1 MG tablet Take 1 mg by mouth at bedtime.   Yes Historical Provider, MD  Misc Natural Products (TART CHERRY ADVANCED PO) Take 1 capsule by mouth 2 (two) times daily.   Yes Historical Provider, MD  tamoxifen (NOLVADEX) 20 MG tablet Take 1 tablet (20 mg total) by mouth daily. 09/29/15  Yes Chauncey Cruel, MD   Allergies Compazine [prochlorperazine]  Family History  Problem Relation Age of Onset  . Stroke Mother   . Diabetes Father   . Diabetes type I Father   . Colon cancer Maternal Grandmother 81  . Healthy Sister   . Healthy Brother   . Pulmonary embolism Maternal Uncle   . Colon cancer Paternal Uncle 37  . Stomach cancer Maternal Grandfather 94    pipe smoker  . Diabetes type I Paternal Grandmother   . Lung cancer Paternal Grandfather   . Heart Problems Paternal Uncle    Social History   Social History  . Marital status: Widowed    Spouse name: N/A  . Number of children: 1  . Years of education: N/A   Social History Main Topics  . Smoking status: Never Smoker  .  Smokeless tobacco: Never Used  . Alcohol use Yes     Comment: 5-7 glasses per week of red wine  . Drug use: No  . Sexual activity: Not Currently    Birth control/ protection: Post-menopausal     Comment: menarche age 16, 48st live birth 69, P27, HRT x 1 month   Other Topics Concern  . Not on file   Social History Narrative  . No narrative on file   There were no vitals taken for this visit. No results found for this or any previous visit (from the past 24 hour(s)). General alert and oriented Lung CTAB Car RRR Abdomen is soft and non tender Pelvic EGBUS WNL Vagina slightly narrow and cervix with fair descensus Uterus is small  Adnexae  normal  IMPRESSION: History of breast cancer Tamoxifen use Thickened endometrium  PLAN: Patient is requesting LAVH and BSO  Risks of procedure reviewed with pateint Questions answered Will proceed

## 2016-09-29 ENCOUNTER — Encounter (HOSPITAL_COMMUNITY): Payer: Self-pay | Admitting: Anesthesiology

## 2016-09-29 NOTE — Anesthesia Preprocedure Evaluation (Addendum)
Anesthesia Evaluation  Patient identified by MRN, date of birth, ID band Patient awake    Reviewed: Allergy & Precautions, NPO status , Patient's Chart, lab work & pertinent test results  Airway Mallampati: I       Dental no notable dental hx.    Pulmonary neg pulmonary ROS,    Pulmonary exam normal        Cardiovascular Normal cardiovascular exam Rhythm:Regular Rate:Normal     Neuro/Psych negative neurological ROS     GI/Hepatic Neg liver ROS,   Endo/Other  Hypothyroidism   Renal/GU negative Renal ROS  negative genitourinary   Musculoskeletal   Abdominal (+) + obese,   Peds  Hematology   Anesthesia Other Findings   Reproductive/Obstetrics                           Anesthesia Physical Anesthesia Plan  ASA: II  Anesthesia Plan: General   Post-op Pain Management:    Induction: Intravenous  Airway Management Planned: Oral ETT  Additional Equipment:   Intra-op Plan:   Post-operative Plan: Extubation in OR  Informed Consent: I have reviewed the patients History and Physical, chart, labs and discussed the procedure including the risks, benefits and alternatives for the proposed anesthesia with the patient or authorized representative who has indicated his/her understanding and acceptance.   Dental advisory given  Plan Discussed with: CRNA and Surgeon  Anesthesia Plan Comments:        Anesthesia Quick Evaluation

## 2016-09-30 ENCOUNTER — Ambulatory Visit (HOSPITAL_COMMUNITY)
Admission: RE | Admit: 2016-09-30 | Discharge: 2016-10-01 | Disposition: A | Discharge status: Home / Self Care | Payer: BLUE CROSS/BLUE SHIELD | Source: Ambulatory Visit | Attending: Obstetrics and Gynecology | Admitting: Obstetrics and Gynecology

## 2016-09-30 ENCOUNTER — Encounter (HOSPITAL_COMMUNITY): Payer: Self-pay | Admitting: *Deleted

## 2016-09-30 ENCOUNTER — Ambulatory Visit (HOSPITAL_COMMUNITY): Payer: BLUE CROSS/BLUE SHIELD | Admitting: Anesthesiology

## 2016-09-30 ENCOUNTER — Encounter (HOSPITAL_COMMUNITY)
Admission: RE | Disposition: A | Discharge status: Home / Self Care | Payer: Self-pay | Source: Ambulatory Visit | Attending: Obstetrics and Gynecology

## 2016-09-30 DIAGNOSIS — Z79810 Long term (current) use of selective estrogen receptor modulators (SERMs): Secondary | ICD-10-CM | POA: Diagnosis not present

## 2016-09-30 DIAGNOSIS — R938 Abnormal findings on diagnostic imaging of other specified body structures: Secondary | ICD-10-CM | POA: Diagnosis not present

## 2016-09-30 DIAGNOSIS — D252 Subserosal leiomyoma of uterus: Secondary | ICD-10-CM | POA: Diagnosis not present

## 2016-09-30 DIAGNOSIS — E039 Hypothyroidism, unspecified: Secondary | ICD-10-CM | POA: Diagnosis not present

## 2016-09-30 DIAGNOSIS — Z90710 Acquired absence of both cervix and uterus: Secondary | ICD-10-CM | POA: Diagnosis present

## 2016-09-30 DIAGNOSIS — N83331 Acquired atrophy of right ovary and fallopian tube: Secondary | ICD-10-CM | POA: Diagnosis not present

## 2016-09-30 DIAGNOSIS — F419 Anxiety disorder, unspecified: Secondary | ICD-10-CM | POA: Insufficient documentation

## 2016-09-30 DIAGNOSIS — Z853 Personal history of malignant neoplasm of breast: Secondary | ICD-10-CM | POA: Insufficient documentation

## 2016-09-30 DIAGNOSIS — N83332 Acquired atrophy of left ovary and fallopian tube: Secondary | ICD-10-CM | POA: Diagnosis not present

## 2016-09-30 DIAGNOSIS — N800 Endometriosis of uterus: Secondary | ICD-10-CM | POA: Diagnosis not present

## 2016-09-30 DIAGNOSIS — D251 Intramural leiomyoma of uterus: Secondary | ICD-10-CM | POA: Diagnosis not present

## 2016-09-30 DIAGNOSIS — Z923 Personal history of irradiation: Secondary | ICD-10-CM | POA: Insufficient documentation

## 2016-09-30 DIAGNOSIS — K219 Gastro-esophageal reflux disease without esophagitis: Secondary | ICD-10-CM | POA: Diagnosis not present

## 2016-09-30 DIAGNOSIS — N8500 Endometrial hyperplasia, unspecified: Secondary | ICD-10-CM | POA: Diagnosis not present

## 2016-09-30 DIAGNOSIS — Z79899 Other long term (current) drug therapy: Secondary | ICD-10-CM | POA: Insufficient documentation

## 2016-09-30 HISTORY — PX: LAPAROSCOPIC VAGINAL HYSTERECTOMY WITH SALPINGO OOPHORECTOMY: SHX6681

## 2016-09-30 LAB — PREPARE RBC (CROSSMATCH)

## 2016-09-30 SURGERY — HYSTERECTOMY, VAGINAL, LAPAROSCOPY-ASSISTED, WITH SALPINGO-OOPHORECTOMY
Anesthesia: General | Site: Abdomen | Laterality: Bilateral

## 2016-09-30 MED ORDER — DIPHENHYDRAMINE HCL 50 MG/ML IJ SOLN: 12.5000 mg | Freq: Four times a day (QID) | INTRAMUSCULAR | Status: DC | PRN

## 2016-09-30 MED ORDER — KETOROLAC TROMETHAMINE 30 MG/ML IJ SOLN: 30.0000 mg | Freq: Once | INTRAMUSCULAR | Status: DC | PRN

## 2016-09-30 MED ORDER — KETOROLAC TROMETHAMINE 30 MG/ML IJ SOLN
INTRAMUSCULAR | Status: AC
  Filled 2016-09-30: qty 1

## 2016-09-30 MED ORDER — ONDANSETRON HCL 4 MG/2ML IJ SOLN: 4.0000 mg | Freq: Four times a day (QID) | INTRAMUSCULAR | Status: DC | PRN

## 2016-09-30 MED ORDER — MIDAZOLAM HCL 2 MG/2ML IJ SOLN
INTRAMUSCULAR | Status: DC | PRN
  Administered 2016-09-30: 1 mg via INTRAVENOUS

## 2016-09-30 MED ORDER — CEFAZOLIN SODIUM-DEXTROSE 2-4 GM/100ML-% IV SOLN
2.0000 g | INTRAVENOUS | Status: AC
  Administered 2016-09-30: 2 g via INTRAVENOUS
  Filled 2016-09-30: qty 100

## 2016-09-30 MED ORDER — DEXAMETHASONE SODIUM PHOSPHATE 10 MG/ML IJ SOLN
INTRAMUSCULAR | Status: AC
  Filled 2016-09-30: qty 1

## 2016-09-30 MED ORDER — KETOROLAC TROMETHAMINE 30 MG/ML IJ SOLN: 30.0000 mg | Freq: Once | INTRAMUSCULAR | Status: DC

## 2016-09-30 MED ORDER — DIPHENHYDRAMINE HCL 12.5 MG/5ML PO ELIX: 12.5000 mg | ORAL_SOLUTION | Freq: Four times a day (QID) | ORAL | Status: DC | PRN

## 2016-09-30 MED ORDER — SODIUM CHLORIDE 0.9 % IV SOLN: Freq: Once | INTRAVENOUS | Status: DC

## 2016-09-30 MED ORDER — DEXAMETHASONE SODIUM PHOSPHATE 10 MG/ML IJ SOLN
INTRAMUSCULAR | Status: DC | PRN
  Administered 2016-09-30: 10 mg via INTRAVENOUS

## 2016-09-30 MED ORDER — IBUPROFEN 600 MG PO TABS
600.0000 mg | ORAL_TABLET | Freq: Four times a day (QID) | ORAL | Status: DC | PRN
  Administered 2016-10-01: 600 mg via ORAL
  Filled 2016-09-30: qty 1

## 2016-09-30 MED ORDER — CITALOPRAM HYDROBROMIDE 10 MG PO TABS
10.0000 mg | ORAL_TABLET | Freq: Every day | ORAL | Status: DC
  Filled 2016-09-30: qty 1

## 2016-09-30 MED ORDER — LEVOTHYROXINE SODIUM 100 MCG PO TABS
100.0000 ug | ORAL_TABLET | Freq: Every day | ORAL | Status: DC
  Filled 2016-09-30: qty 1

## 2016-09-30 MED ORDER — NALOXONE HCL 0.4 MG/ML IJ SOLN
0.4000 mg | INTRAMUSCULAR | Status: DC | PRN
Start: 2016-09-30 — End: 2016-10-01

## 2016-09-30 MED ORDER — SUGAMMADEX SODIUM 200 MG/2ML IV SOLN
INTRAVENOUS | Status: DC | PRN
  Administered 2016-09-30: 190 mg via INTRAVENOUS

## 2016-09-30 MED ORDER — ONDANSETRON HCL 4 MG/2ML IJ SOLN
INTRAMUSCULAR | Status: DC | PRN
  Administered 2016-09-30: 4 mg via INTRAVENOUS

## 2016-09-30 MED ORDER — MIDAZOLAM HCL 2 MG/2ML IJ SOLN
INTRAMUSCULAR | Status: AC
  Filled 2016-09-30: qty 2

## 2016-09-30 MED ORDER — MEPERIDINE HCL 25 MG/ML IJ SOLN: 6.2500 mg | INTRAMUSCULAR | Status: DC | PRN

## 2016-09-30 MED ORDER — FENTANYL CITRATE (PF) 100 MCG/2ML IJ SOLN
INTRAMUSCULAR | Status: DC | PRN
  Administered 2016-09-30: 25 ug via INTRAVENOUS
  Administered 2016-09-30: 50 ug via INTRAVENOUS
  Administered 2016-09-30: 25 ug via INTRAVENOUS
  Administered 2016-09-30: 50 ug via INTRAVENOUS
  Administered 2016-09-30: 25 ug via INTRAVENOUS
  Administered 2016-09-30: 50 ug via INTRAVENOUS
  Administered 2016-09-30: 25 ug via INTRAVENOUS
  Administered 2016-09-30 (×2): 50 ug via INTRAVENOUS

## 2016-09-30 MED ORDER — SCOPOLAMINE 1 MG/3DAYS TD PT72
1.0000 | MEDICATED_PATCH | Freq: Once | TRANSDERMAL | Status: DC
  Administered 2016-09-30: 1.5 mg via TRANSDERMAL

## 2016-09-30 MED ORDER — HYDROMORPHONE HCL 1 MG/ML IJ SOLN
0.2500 mg | INTRAMUSCULAR | Status: DC | PRN
  Administered 2016-09-30 (×4): 0.5 mg via INTRAVENOUS

## 2016-09-30 MED ORDER — ROCURONIUM BROMIDE 100 MG/10ML IV SOLN
INTRAVENOUS | Status: DC | PRN
  Administered 2016-09-30: 30 mg via INTRAVENOUS
  Administered 2016-09-30: 5 mg via INTRAVENOUS
  Administered 2016-09-30: 10 mg via INTRAVENOUS
  Administered 2016-09-30: 5 mg via INTRAVENOUS

## 2016-09-30 MED ORDER — KETOROLAC TROMETHAMINE 30 MG/ML IJ SOLN
INTRAMUSCULAR | Status: DC | PRN
  Administered 2016-09-30: 30 mg via INTRAVENOUS

## 2016-09-30 MED ORDER — LACTATED RINGERS IV SOLN
INTRAVENOUS | Status: DC
  Administered 2016-09-30: 07:00:00 via INTRAVENOUS
  Administered 2016-09-30: 1000 mL via INTRAVENOUS
  Administered 2016-09-30: 08:00:00 via INTRAVENOUS

## 2016-09-30 MED ORDER — LACTATED RINGERS IV SOLN
INTRAVENOUS | Status: DC
  Administered 2016-09-30: 18:00:00 via INTRAVENOUS

## 2016-09-30 MED ORDER — SUGAMMADEX SODIUM 200 MG/2ML IV SOLN
INTRAVENOUS | Status: AC
  Filled 2016-09-30: qty 2

## 2016-09-30 MED ORDER — ONDANSETRON HCL 4 MG/2ML IJ SOLN
INTRAMUSCULAR | Status: AC
  Filled 2016-09-30: qty 2

## 2016-09-30 MED ORDER — SODIUM CHLORIDE 0.9% FLUSH: 9.0000 mL | INTRAVENOUS | Status: DC | PRN

## 2016-09-30 MED ORDER — MENTHOL 3 MG MT LOZG: 1.0000 | LOZENGE | OROMUCOSAL | Status: DC | PRN

## 2016-09-30 MED ORDER — ROCURONIUM BROMIDE 100 MG/10ML IV SOLN
INTRAVENOUS | Status: AC
  Filled 2016-09-30: qty 1

## 2016-09-30 MED ORDER — FENTANYL CITRATE (PF) 250 MCG/5ML IJ SOLN
INTRAMUSCULAR | Status: AC
  Filled 2016-09-30: qty 5

## 2016-09-30 MED ORDER — LIDOCAINE HCL (CARDIAC) 20 MG/ML IV SOLN
INTRAVENOUS | Status: DC | PRN
  Administered 2016-09-30: 100 mg via INTRAVENOUS

## 2016-09-30 MED ORDER — SODIUM CHLORIDE 0.9 % IR SOLN
Status: DC | PRN
  Administered 2016-09-30: 3000 mL

## 2016-09-30 MED ORDER — LIDOCAINE HCL (CARDIAC) 20 MG/ML IV SOLN
INTRAVENOUS | Status: AC
  Filled 2016-09-30: qty 5

## 2016-09-30 MED ORDER — HYDROMORPHONE HCL 1 MG/ML IJ SOLN
INTRAMUSCULAR | Status: AC
  Filled 2016-09-30: qty 1

## 2016-09-30 MED ORDER — SCOPOLAMINE 1 MG/3DAYS TD PT72
MEDICATED_PATCH | TRANSDERMAL | Status: AC
  Administered 2016-09-30: 1.5 mg via TRANSDERMAL
  Filled 2016-09-30: qty 1

## 2016-09-30 MED ORDER — LORAZEPAM 1 MG PO TABS
1.0000 mg | ORAL_TABLET | Freq: Every day | ORAL | Status: DC
  Administered 2016-09-30: 1 mg via ORAL
  Filled 2016-09-30: qty 1

## 2016-09-30 MED ORDER — HYDROMORPHONE 1 MG/ML IV SOLN
INTRAVENOUS | Status: DC
Start: 2016-09-30 — End: 2016-10-01
  Administered 2016-09-30: 0.2 mg via INTRAVENOUS
  Administered 2016-09-30: 1.2 mg via INTRAVENOUS
  Administered 2016-09-30: 11:00:00 via INTRAVENOUS
  Filled 2016-09-30: qty 25

## 2016-09-30 MED ORDER — PROPOFOL 10 MG/ML IV BOLUS
INTRAVENOUS | Status: DC | PRN
  Administered 2016-09-30: 150 mg via INTRAVENOUS
  Administered 2016-09-30: 30 mg via INTRAVENOUS
  Administered 2016-09-30: 20 mg via INTRAVENOUS

## 2016-09-30 MED ORDER — ONDANSETRON HCL 4 MG/2ML IJ SOLN: 4.0000 mg | Freq: Once | INTRAMUSCULAR | Status: DC | PRN

## 2016-09-30 MED ORDER — PROPOFOL 10 MG/ML IV BOLUS
INTRAVENOUS | Status: AC
  Filled 2016-09-30: qty 20

## 2016-09-30 MED ORDER — FENTANYL CITRATE (PF) 100 MCG/2ML IJ SOLN
INTRAMUSCULAR | Status: AC
  Filled 2016-09-30: qty 2

## 2016-09-30 MED ORDER — BUPIVACAINE HCL (PF) 0.25 % IJ SOLN
INTRAMUSCULAR | Status: DC | PRN
  Administered 2016-09-30: 5 mL

## 2016-09-30 MED ORDER — TRAMADOL HCL 50 MG PO TABS
50.0000 mg | ORAL_TABLET | Freq: Four times a day (QID) | ORAL | Status: DC | PRN
  Administered 2016-09-30 – 2016-10-01 (×2): 50 mg via ORAL
  Filled 2016-09-30 (×2): qty 1

## 2016-09-30 MED ORDER — BUPIVACAINE HCL (PF) 0.25 % IJ SOLN
INTRAMUSCULAR | Status: AC
  Filled 2016-09-30: qty 30

## 2016-09-30 MED ORDER — SODIUM CHLORIDE 0.9 % IJ SOLN
INTRAMUSCULAR | Status: AC
  Filled 2016-09-30: qty 10

## 2016-09-30 SURGICAL SUPPLY — 46 items
APPLICATOR ARISTA FLEXITIP XL (MISCELLANEOUS) ×2 IMPLANT
BARRIER ADHS 3X4 INTERCEED (GAUZE/BANDAGES/DRESSINGS) IMPLANT
CABLE HIGH FREQUENCY MONO STRZ (ELECTRODE) IMPLANT
CLOTH BEACON ORANGE TIMEOUT ST (SAFETY) ×2 IMPLANT
CONT PATH 16OZ SNAP LID 3702 (MISCELLANEOUS) ×2 IMPLANT
COVER BACK TABLE 60X90IN (DRAPES) ×2 IMPLANT
DECANTER SPIKE VIAL GLASS SM (MISCELLANEOUS) ×4 IMPLANT
DERMABOND ADVANCED (GAUZE/BANDAGES/DRESSINGS) ×1
DERMABOND ADVANCED .7 DNX12 (GAUZE/BANDAGES/DRESSINGS) ×1 IMPLANT
DRSG OPSITE POSTOP 3X4 (GAUZE/BANDAGES/DRESSINGS) ×2 IMPLANT
DURAPREP 26ML APPLICATOR (WOUND CARE) ×2 IMPLANT
ELECT REM PT RETURN 9FT ADLT (ELECTROSURGICAL) ×2
ELECTRODE REM PT RTRN 9FT ADLT (ELECTROSURGICAL) ×1 IMPLANT
FILTER SMOKE EVAC LAPAROSHD (FILTER) IMPLANT
GLOVE BIO SURGEON STRL SZ 6.5 (GLOVE) ×2 IMPLANT
GLOVE BIOGEL PI IND STRL 6.5 (GLOVE) ×1 IMPLANT
GLOVE BIOGEL PI IND STRL 7.0 (GLOVE) ×3 IMPLANT
GLOVE BIOGEL PI INDICATOR 6.5 (GLOVE) ×1
GLOVE BIOGEL PI INDICATOR 7.0 (GLOVE) ×3
HEMOSTAT ARISTA ABSORB 3G PWDR (MISCELLANEOUS) ×2 IMPLANT
LEGGING LITHOTOMY PAIR STRL (DRAPES) ×2 IMPLANT
NEEDLE INSUFFLATION 120MM (ENDOMECHANICALS) ×2 IMPLANT
NS IRRIG 1000ML POUR BTL (IV SOLUTION) ×2 IMPLANT
PACK LAVH (CUSTOM PROCEDURE TRAY) ×2 IMPLANT
PACK ROBOTIC GOWN (GOWN DISPOSABLE) ×2 IMPLANT
PACK TRENDGUARD 450 HYBRID PRO (MISCELLANEOUS) ×1 IMPLANT
PACK TRENDGUARD 600 HYBRD PROC (MISCELLANEOUS) IMPLANT
PROTECTOR NERVE ULNAR (MISCELLANEOUS) ×4 IMPLANT
SEALER TISSUE G2 CVD JAW 45CM (ENDOMECHANICALS) ×2 IMPLANT
SET IRRIG TUBING LAPAROSCOPIC (IRRIGATION / IRRIGATOR) ×2 IMPLANT
SLEEVE XCEL OPT CAN 5 100 (ENDOMECHANICALS) IMPLANT
SOLUTION ELECTROLUBE (MISCELLANEOUS) IMPLANT
SUT VIC AB 0 CT1 18XCR BRD8 (SUTURE) ×2 IMPLANT
SUT VIC AB 0 CT1 36 (SUTURE) ×6 IMPLANT
SUT VIC AB 0 CT1 8-18 (SUTURE) ×2
SUT VIC AB 3-0 PS2 18 (SUTURE) ×1
SUT VIC AB 3-0 PS2 18XBRD (SUTURE) ×1 IMPLANT
SUT VICRYL 0 TIES 12 18 (SUTURE) ×2 IMPLANT
SUT VICRYL 0 UR6 27IN ABS (SUTURE) ×2 IMPLANT
TOWEL OR 17X24 6PK STRL BLUE (TOWEL DISPOSABLE) ×4 IMPLANT
TRAY FOLEY CATH SILVER 14FR (SET/KITS/TRAYS/PACK) ×2 IMPLANT
TRENDGUARD 450 HYBRID PRO PACK (MISCELLANEOUS) ×2
TRENDGUARD 600 HYBRID PROC PK (MISCELLANEOUS)
TROCAR OPTI TIP 5M 100M (ENDOMECHANICALS) ×2 IMPLANT
TROCAR XCEL DIL TIP R 11M (ENDOMECHANICALS) ×2 IMPLANT
WARMER LAPAROSCOPE (MISCELLANEOUS) ×2 IMPLANT

## 2016-09-30 NOTE — Progress Notes (Signed)
Patient doing well  Routine care  VSS  Urine output clear  Dressings clean and dry  Doing well Routine care

## 2016-09-30 NOTE — Anesthesia Postprocedure Evaluation (Signed)
Anesthesia Post Note  Patient: Lori Vincent  Procedure(s) Performed: Procedure(s) (LRB): LAPAROSCOPIC ASSISTED VAGINAL HYSTERECTOMY WITH BILATERAL SALPINGO OOPHORECTOMY (Bilateral)  Patient location during evaluation: PACU Anesthesia Type: General Level of consciousness: awake and sedated Pain management: pain level controlled Vital Signs Assessment: post-procedure vital signs reviewed and stable Respiratory status: spontaneous breathing Cardiovascular status: stable Postop Assessment: no signs of nausea or vomiting Anesthetic complications: no        Last Vitals:  Vitals:   09/30/16 0623  BP: 135/73  Pulse: 83  Resp: 16  Temp: 36.8 C    Last Pain:  Vitals:   09/30/16 0623  PainSc: 0-No pain   Pain Goal: Patients Stated Pain Goal: 2 (09/30/16 9977)               Abdi Husak JR,JOHN Mateo Flow

## 2016-09-30 NOTE — Anesthesia Procedure Notes (Signed)
Procedure Name: Intubation Date/Time: 09/30/2016 7:29 AM Performed by: Georgeanne Nim Pre-anesthesia Checklist: Patient identified, Emergency Drugs available, Suction available, Patient being monitored and Timeout performed Patient Re-evaluated:Patient Re-evaluated prior to inductionOxygen Delivery Method: Circle system utilized Preoxygenation: Pre-oxygenation with 100% oxygen Intubation Type: IV induction Ventilation: Mask ventilation without difficulty Laryngoscope Size: Mac and 3 Grade View: Grade I Tube type: Oral Tube size: 7.0 mm Number of attempts: 1 Airway Equipment and Method: Stylet Placement Confirmation: ETT inserted through vocal cords under direct vision,  positive ETCO2,  CO2 detector and breath sounds checked- equal and bilateral Secured at: 21 cm Tube secured with: Tape Dental Injury: Teeth and Oropharynx as per pre-operative assessment

## 2016-09-30 NOTE — Anesthesia Postprocedure Evaluation (Addendum)
Anesthesia Post Note  Patient: Lori Vincent  Procedure(s) Performed: Procedure(s) (LRB): LAPAROSCOPIC ASSISTED VAGINAL HYSTERECTOMY WITH BILATERAL SALPINGO OOPHORECTOMY (Bilateral)  Patient location during evaluation: [facility] Anesthesia Type: General Level of consciousness: awake, awake and alert, oriented and patient cooperative Pain management: pain level controlled Vital Signs Assessment: post-procedure vital signs reviewed and stable Respiratory status: spontaneous breathing, nonlabored ventilation, respiratory function stable and patient connected to nasal cannula oxygen Cardiovascular status: stable Postop Assessment: no signs of nausea or vomiting Anesthetic complications: no        Last Vitals:  Vitals:   09/30/16 1115 09/30/16 1205  BP:  (!) 110/54  Pulse:  71  Resp: 16 16  Temp:  37.1 C    Last Pain:  Vitals:   09/30/16 1205  TempSrc: Axillary  PainSc: 3    Pain Goal: Patients Stated Pain Goal: 2 (09/30/16 1205)               CARVER,Cylee L

## 2016-09-30 NOTE — Op Note (Signed)
NAMEMarland Kitchen  VIDA, Lori Vincent NO.:  [id number]  MEDICAL RECORD NO.:  [id number]  LOCATION:                                 FACILITY:  PHYSICIAN:  Alexius Hangartner L. Helane Rima, M.D.    DATE OF BIRTH:  DATE OF PROCEDURE:  09/30/2016 DATE OF DISCHARGE:                              OPERATIVE REPORT   PREOPERATIVE DIAGNOSIS:  History of breast cancer, thickened endometrium, on tamoxifen use.  POSTOPERATIVE DIAGNOSIS:  History of breast cancer, thickened endometrium, on tamoxifen use.  PROCEDURES:  Laparoscopic-assisted vaginal hysterectomy and bilateral salpingo-oophorectomy.  SURGEON:  Tenya Araque L. Helane Rima, M.D.  ASSISTANT:  Dr. Royston Sinner.  ESTIMATED BLOOD LOSS:  300 mL.  COMPLICATIONS:  None.  DESCRIPTION OF PROCEDURE:  The patient was taken to the operating room. She was intubated.  She was prepped and draped.  A Foley catheter and uterine manipulator were inserted.  Attention was turned to the abdomen. A small incision was made.  The Veress needle was inserted and pneumoperitoneum was performed.  The trocar was inserted.  The scope was introduced through the trocar sheath.  No area of bleeding or intestinal injury was noted.  The patient was placed in Trendelenburg position. The suprapubic trocar was then inserted under direct visualization.  The adnexa were normal.  Tubes and ovaries were normal and the uterus was normal.  Using the EnSeal, the right tube and ovary were released from the infundibulopelvic ligament with careful attention to avoid the ureter which was deep in the pelvis and carried that down all the way along the mesosalpinx into the round ligament.  This was done with excellent hemostasis on the right and on the left.  We then released the pneumoperitoneum, placed the weighted speculum in the vagina.  The cervix was pretty deep in the vagina that we placed her legs up and then I was able to grasp the cervix and bring it down some.  I made a circumferential  incision around the cervix and at the anterior and posterior cul-de-sacs.  I placed curved Haney clamps across the uterosacral cardinal ligaments.  Each pedicle was clamped, cut, suture ligated using 0 Vicryl suture.  We walked our way up the broad ligament. Each pedicle was clamped, cut, and suture ligated using 0 Vicryl suture. We then removed the uterus.  The pedicles were secured.  I then made stitch at 3 and 9 o'clock in the vaginal cuff for hemostasis and then closed the cuff anterior to posterior in a running lock stitch.  We then went back into the abdomen.  Hemostasis was noted.  After irrigation was performed, I then placed Arista along the pelvis of the vaginal cuff and the sidewalls for postoperative hemostasis.  The pneumoperitoneum was released.  The trocars were removed.  The incision was closed at the umbilicus using 0 Vicryl and the skin was closed at both sites using Dermabond.  All sponge, lap, and instrument counts were correct x2.  Urine output was good and was approximately 250 mL of clear urine at the end of the case.     Aras Albarran L. Helane Rima, M.D.     Nevin Bloodgood  D:  09/30/2016  T:  09/30/2016  Job:  [id number]

## 2016-09-30 NOTE — Addendum Note (Signed)
Addendum  created 09/30/16 1321 by Raenette Rover, CRNA   Sign clinical note

## 2016-09-30 NOTE — Transfer of Care (Signed)
Immediate Anesthesia Transfer of Care Note  Patient: Lori Vincent  Procedure(s) Performed: Procedure(s): LAPAROSCOPIC ASSISTED VAGINAL HYSTERECTOMY WITH BILATERAL SALPINGO OOPHORECTOMY (Bilateral)  Patient Location: PACU  Anesthesia Type:General  Level of Consciousness: awake, alert  and patient cooperative  Airway & Oxygen Therapy: Patient Spontanous Breathing and Patient connected to nasal cannula oxygen  Post-op Assessment: Report given to RN and Post -op Vital signs reviewed and stable  Post vital signs: Reviewed and stable  Last Vitals:  Vitals:   09/30/16 0623  BP: 135/73  Pulse: 83  Resp: 16  Temp: 36.8 C    Last Pain:  Vitals:   09/30/16 0623  PainSc: 0-No pain      Patients Stated Pain Goal: 2 (71/83/67 2550)  Complications: No apparent anesthesia complications

## 2016-09-30 NOTE — Progress Notes (Signed)
Patient doing well. No complaints.  H and P on chart  Will proceed with LAVH and BSO

## 2016-09-30 NOTE — Brief Op Note (Signed)
09/30/2016  9:08 AM  PATIENT:  Lori Vincent  61 y.o. female  PRE-OPERATIVE DIAGNOSIS:  History of Breast Cancer Thickened Endometrium on Tamoxifen  POST-OPERATIVE DIAGNOSIS:  Same  PROCEDURE:  Procedure(s): LAPAROSCOPIC ASSISTED VAGINAL HYSTERECTOMY WITH BILATERAL SALPINGO OOPHORECTOMY (Bilateral)  SURGEON:  Surgeon(s) and Role:    * Dian Queen, MD - Primary    * Tyson Dense, MD - Assisting  PHYSICIAN ASSISTANT:   ASSISTANTS: none   ANESTHESIA:   general  EBL:  Total I/O In: 18 [I.V.:1900] Out: 550 [Urine:250; Blood:300]  BLOOD ADMINISTERED:none  DRAINS: Urinary Catheter (Foley)   LOCAL MEDICATIONS USED:  LIDOCAINE   SPECIMEN:  Source of Specimen:  uterus cervix tubes and ovaries  DISPOSITION OF SPECIMEN:  PATHOLOGY  COUNTS:  YES  TOURNIQUET:  * No tourniquets in log *  DICTATION: .Other Dictation: Dictation Number [id number]  PLAN OF CARE: Admit for overnight observation  PATIENT DISPOSITION:  PACU - hemodynamically stable.   Delay start of Pharmacological VTE agent (>24hrs) due to surgical blood loss or risk of bleeding: not applicable

## 2016-10-01 DIAGNOSIS — Z853 Personal history of malignant neoplasm of breast: Secondary | ICD-10-CM | POA: Diagnosis not present

## 2016-10-01 DIAGNOSIS — N83331 Acquired atrophy of right ovary and fallopian tube: Secondary | ICD-10-CM | POA: Diagnosis not present

## 2016-10-01 DIAGNOSIS — D251 Intramural leiomyoma of uterus: Secondary | ICD-10-CM | POA: Diagnosis not present

## 2016-10-01 DIAGNOSIS — D252 Subserosal leiomyoma of uterus: Secondary | ICD-10-CM | POA: Diagnosis not present

## 2016-10-01 DIAGNOSIS — F419 Anxiety disorder, unspecified: Secondary | ICD-10-CM | POA: Diagnosis not present

## 2016-10-01 DIAGNOSIS — Z923 Personal history of irradiation: Secondary | ICD-10-CM | POA: Diagnosis not present

## 2016-10-01 DIAGNOSIS — N800 Endometriosis of uterus: Secondary | ICD-10-CM | POA: Diagnosis not present

## 2016-10-01 DIAGNOSIS — R938 Abnormal findings on diagnostic imaging of other specified body structures: Secondary | ICD-10-CM | POA: Diagnosis not present

## 2016-10-01 DIAGNOSIS — Z79899 Other long term (current) drug therapy: Secondary | ICD-10-CM | POA: Diagnosis not present

## 2016-10-01 DIAGNOSIS — E039 Hypothyroidism, unspecified: Secondary | ICD-10-CM | POA: Diagnosis not present

## 2016-10-01 DIAGNOSIS — Z79810 Long term (current) use of selective estrogen receptor modulators (SERMs): Secondary | ICD-10-CM | POA: Diagnosis not present

## 2016-10-01 DIAGNOSIS — N83332 Acquired atrophy of left ovary and fallopian tube: Secondary | ICD-10-CM | POA: Diagnosis not present

## 2016-10-01 LAB — CBC
HCT: 29.1 % — ABNORMAL LOW (ref 36.0–46.0)
HEMOGLOBIN: 9.6 g/dL — AB (ref 12.0–15.0)
MCH: 29.4 pg (ref 26.0–34.0)
MCHC: 33 g/dL (ref 30.0–36.0)
MCV: 89.3 fL (ref 78.0–100.0)
PLATELETS: 156 10*3/uL (ref 150–400)
RBC: 3.26 MIL/uL — AB (ref 3.87–5.11)
RDW: 15.2 % (ref 11.5–15.5)
WBC: 7.3 10*3/uL (ref 4.0–10.5)

## 2016-10-01 MED ORDER — TRAMADOL HCL 50 MG PO TABS: 50.0000 mg | ORAL_TABLET | Freq: Four times a day (QID) | ORAL | 0 refills | Status: DC | PRN

## 2016-10-01 MED ORDER — IBUPROFEN 600 MG PO TABS: 600.0000 mg | ORAL_TABLET | Freq: Four times a day (QID) | ORAL | 0 refills | Status: DC | PRN

## 2016-10-01 NOTE — Discharge Summary (Signed)
Admission Diagnosis:  Thickened endometrium History of Breast Cancer  Discharge Diagnosis:  Same  Hospital Course:  61 year old female admitted for LAVH and BSO.  She did very well postop  By POD #1 she was ambulating, voiding, tolerating pos and had good pain control.  BP (!) 96/56 (BP Location: Right Arm) Comment: nurse notified  Pulse 80   Temp 98.7 F (37.1 C) (Oral)   Resp 18   Ht 5\' 7"  (1.702 m)   Wt 95.7 kg (211 lb)   SpO2 98%   BMI 33.05 kg/m  Results for orders placed or performed during the hospital encounter of 09/30/16 (from the past 24 hour(s))  CBC     Status: Abnormal   Collection Time: 10/01/16  5:36 AM  Result Value Ref Range   WBC 7.3 4.0 - 10.5 K/uL   RBC 3.26 (L) 3.87 - 5.11 MIL/uL   Hemoglobin 9.6 (L) 12.0 - 15.0 g/dL   HCT 29.1 (L) 36.0 - 46.0 %   MCV 89.3 78.0 - 100.0 fL   MCH 29.4 26.0 - 34.0 pg   MCHC 33.0 30.0 - 36.0 g/dL   RDW 15.2 11.5 - 15.5 %   Platelets 156 150 - 400 K/uL   Abdomen is soft and non tender Bandages clean and dry  She will be discharged in good condition Rx Ultram and Ibuprofen  Follow up in 1 week

## 2016-10-01 NOTE — Progress Notes (Signed)
Discharge teaching complete with pt. Pt understood all information and did not have any questions. Pt ambulated out of the hospital and discharged home to family.

## 2016-10-02 ENCOUNTER — Encounter (HOSPITAL_COMMUNITY): Payer: Self-pay | Admitting: Obstetrics and Gynecology

## 2016-10-02 LAB — TYPE AND SCREEN
ABO/RH(D): O POS
Antibody Screen: POSITIVE
DAT, IGG: NEGATIVE
Unit division: 0
Unit division: 0

## 2016-10-02 LAB — BPAM RBC
BLOOD PRODUCT EXPIRATION DATE: 201805242359
Blood Product Expiration Date: 201805162359
UNIT TYPE AND RH: 5100
Unit Type and Rh: 5100

## 2016-10-08 ENCOUNTER — Ambulatory Visit
Admission: RE | Admit: 2016-10-08 | Discharge: 2016-10-08 | Disposition: A | Discharge status: Home / Self Care | Payer: BLUE CROSS/BLUE SHIELD | Source: Ambulatory Visit | Attending: Endocrinology | Admitting: Endocrinology

## 2016-10-08 DIAGNOSIS — E010 Iodine-deficiency related diffuse (endemic) goiter: Secondary | ICD-10-CM

## 2016-10-08 DIAGNOSIS — E039 Hypothyroidism, unspecified: Secondary | ICD-10-CM | POA: Diagnosis not present

## 2016-10-15 DIAGNOSIS — Z6833 Body mass index (BMI) 33.0-33.9, adult: Secondary | ICD-10-CM | POA: Diagnosis not present

## 2016-10-15 DIAGNOSIS — R7302 Impaired glucose tolerance (oral): Secondary | ICD-10-CM | POA: Diagnosis not present

## 2016-11-01 NOTE — Addendum Note (Signed)
Addendum  created 11/01/16 1228 by Lyn Hollingshead, MD   Sign clinical note

## 2016-11-11 ENCOUNTER — Other Ambulatory Visit: Payer: Self-pay | Admitting: Oncology

## 2017-02-24 DIAGNOSIS — R0609 Other forms of dyspnea: Secondary | ICD-10-CM | POA: Diagnosis not present

## 2017-02-24 DIAGNOSIS — E119 Type 2 diabetes mellitus without complications: Secondary | ICD-10-CM | POA: Diagnosis not present

## 2017-02-24 DIAGNOSIS — M25562 Pain in left knee: Secondary | ICD-10-CM | POA: Diagnosis not present

## 2017-02-24 DIAGNOSIS — E038 Other specified hypothyroidism: Secondary | ICD-10-CM | POA: Diagnosis not present

## 2017-02-24 DIAGNOSIS — Z0000 Encounter for general adult medical examination without abnormal findings: Secondary | ICD-10-CM | POA: Diagnosis not present

## 2017-02-24 DIAGNOSIS — E669 Obesity, unspecified: Secondary | ICD-10-CM | POA: Diagnosis not present

## 2017-02-27 DIAGNOSIS — Z0000 Encounter for general adult medical examination without abnormal findings: Secondary | ICD-10-CM | POA: Diagnosis not present

## 2017-03-03 ENCOUNTER — Other Ambulatory Visit: Payer: Self-pay | Admitting: Oncology

## 2017-03-03 DIAGNOSIS — Z1231 Encounter for screening mammogram for malignant neoplasm of breast: Secondary | ICD-10-CM

## 2017-03-04 ENCOUNTER — Other Ambulatory Visit: Payer: Self-pay | Admitting: Oncology

## 2017-03-04 DIAGNOSIS — Z853 Personal history of malignant neoplasm of breast: Secondary | ICD-10-CM

## 2017-03-14 DIAGNOSIS — R1011 Right upper quadrant pain: Secondary | ICD-10-CM | POA: Diagnosis not present

## 2017-03-17 DIAGNOSIS — R1011 Right upper quadrant pain: Secondary | ICD-10-CM | POA: Diagnosis not present

## 2017-04-01 DIAGNOSIS — E119 Type 2 diabetes mellitus without complications: Secondary | ICD-10-CM | POA: Diagnosis not present

## 2017-04-01 DIAGNOSIS — Z6833 Body mass index (BMI) 33.0-33.9, adult: Secondary | ICD-10-CM | POA: Diagnosis not present

## 2017-04-16 DIAGNOSIS — C50912 Malignant neoplasm of unspecified site of left female breast: Secondary | ICD-10-CM | POA: Diagnosis not present

## 2017-04-16 DIAGNOSIS — K829 Disease of gallbladder, unspecified: Secondary | ICD-10-CM | POA: Diagnosis not present

## 2017-04-22 ENCOUNTER — Ambulatory Visit: Payer: Self-pay | Admitting: Surgery

## 2017-05-14 NOTE — Pre-Procedure Instructions (Addendum)
Lori Vincent  05/14/2017      [facility], [place] [address] Phone: [phone] Fax: [phone]  [facility] [place], [place] - [place] AT [facility] [address] [place] [address] Phone: [phone] Fax: [place] [address] [place] [address] Phone: [phone] Fax: [phone]    Your procedure is scheduled on May 16, 2017.  Report to [facility] Admitting at [place] AM.  Call this number if you have problems the morning of surgery:  [phone]   Remember:  Do not eat food or drink liquids after midnight.  Take these medicines the morning of surgery with A SIP OF WATER citalopram (celexa), levothyroxine (synthroid), tamoxifen (nolvadex)  7 days prior to surgery STOP taking any Aspirin (unless otherwise instructed by your surgeon), Aleve, Naproxen, Ibuprofen, Motrin, Advil, Goody's, BC's, all herbal medications, fish oil, and all vitamins  Continue all other medications as instructed by your physician except follow the above medication instructions before surgery    WHAT DO I DO ABOUT MY DIABETES MEDICATION?   Marland Kitchen Do not take oral diabetes medicines (pills) the morning of surgery.  Reviewed and Endorsed by [facility] Patient Education Committee, August 2015   How to Manage Your Diabetes Before and After Surgery  Why is it important to control my blood sugar before and after surgery? . Improving blood sugar levels before and after surgery helps healing and can limit problems. . A way of improving blood sugar control is eating a healthy diet by: o  Eating less sugar and carbohydrates o  Increasing activity/exercise o  Talking with your doctor about reaching your blood sugar goals . High blood sugars (greater than 180 mg/dL) can raise your risk of infections and slow  your recovery, so you will need to focus on controlling your diabetes during the weeks before surgery. . Make sure that the doctor who takes care of your diabetes knows about your planned surgery including the date and location.  How do I manage my blood sugar before surgery? . Check your blood sugar at least 4 times a day, starting 2 days before surgery, to make sure that the level is not too high or low. o Check your blood sugar the morning of your surgery when you wake up and every 2 hours until you get to the Short Stay unit. . If your blood sugar is less than 70 mg/dL, you will need to treat for low blood sugar: o Do not take insulin. o Treat a low blood sugar (less than 70 mg/dL) with  cup of clear juice (cranberry or apple), 4 glucose tablets, OR glucose gel. Recheck blood sugar in 15 minutes after treatment (to make sure it is greater than 70 mg/dL). If your blood sugar is not greater than 70 mg/dL on recheck, call [phone] o  for further instructions. . Report your blood sugar to the short stay nurse when you get to Short Stay.  . If you are admitted to the hospital after surgery: o Your blood sugar will be checked by the staff and you will probably be given insulin after surgery (instead of oral diabetes medicines) to make sure you have good blood sugar levels. o The goal for blood sugar control after surgery is 80-180 mg/dL.    Do not wear jewelry, make-up or nail  polish.  Do not wear lotions, powders, or perfumes, or deodorant.  Do not shave 48 hours prior to surgery.    Do not bring valuables to the hospital.  [facility] is not responsible for any belongings or valuables.  Contacts, dentures or bridgework may not be worn into surgery.  Leave your suitcase in the car.  After surgery it may be brought to your room.  For patients admitted to the hospital, discharge time will be determined by your treatment team.  Patients discharged the day of surgery will not be allowed  to drive home.   Special instructions:   [facility]- Preparing For Surgery  Before surgery, you can play an important role. Because skin is not sterile, your skin needs to be as free of germs as possible. You can reduce the number of germs on your skin by washing with CHG (chlorahexidine gluconate) Soap before surgery.  CHG is an antiseptic cleaner which kills germs and bonds with the skin to continue killing germs even after washing.  Please do not use if you have an allergy to CHG or antibacterial soaps. If your skin becomes reddened/irritated stop using the CHG.  Do not shave (including legs and underarms) for at least 48 hours prior to first CHG shower. It is OK to shave your face.  Please follow these instructions carefully.   1. Shower the NIGHT BEFORE SURGERY and the MORNING OF SURGERY with CHG.   2. If you chose to wash your hair, wash your hair first as usual with your normal shampoo.  3. After you shampoo, rinse your hair and body thoroughly to remove the shampoo.  4. Use CHG as you would any other liquid soap. You can apply CHG directly to the skin and wash gently with a scrungie or a clean washcloth.   5. Apply the CHG Soap to your body ONLY FROM THE NECK DOWN.  Do not use on open wounds or open sores. Avoid contact with your eyes, ears, mouth and genitals (private parts). Wash Face and genitals (private parts)  with your normal soap.  6. Wash thoroughly, paying special attention to the area where your surgery will be performed.  7. Thoroughly rinse your body with warm water from the neck down.  8. DO NOT shower/wash with your normal soap after using and rinsing off the CHG Soap.  9. Pat yourself dry with a CLEAN TOWEL.  10. Wear CLEAN PAJAMAS to bed the night before surgery, wear comfortable clothes the morning of surgery  11. Place CLEAN SHEETS on your bed the night of your first shower and DO NOT SLEEP WITH PETS.  Day of Surgery: Do not apply any deodorants/lotions.  Please wear clean clothes to the hospital/surgery center.    Please read over the following fact sheets that you were given. Pain Booklet, Coughing and Deep Breathing and Surgical Site Infection Prevention

## 2017-05-14 NOTE — Progress Notes (Addendum)
Lori Vincent, Lori Saxon, MD  Cardiologist: pt denies  EKG: a few months ago per pt, requested from Dr. Marton Redwood  Stress test: pt denies ever    ECHO: 06/20/2014 in [place] because pt was on Chemotherapy  Cardiac Cath: pt denies ever  Chest x-ray: pt denies past year, no recent respiratory complications/infections

## 2017-05-15 ENCOUNTER — Other Ambulatory Visit: Payer: Self-pay

## 2017-05-15 ENCOUNTER — Encounter (HOSPITAL_COMMUNITY): Payer: Self-pay

## 2017-05-15 ENCOUNTER — Encounter (HOSPITAL_COMMUNITY)
Admission: RE | Admit: 2017-05-15 | Discharge: 2017-05-15 | Disposition: A | Discharge status: Home / Self Care | Payer: BLUE CROSS/BLUE SHIELD | Source: Ambulatory Visit | Attending: Surgery | Admitting: Surgery

## 2017-05-15 DIAGNOSIS — K811 Chronic cholecystitis: Secondary | ICD-10-CM | POA: Diagnosis not present

## 2017-05-15 DIAGNOSIS — Z7984 Long term (current) use of oral hypoglycemic drugs: Secondary | ICD-10-CM | POA: Diagnosis not present

## 2017-05-15 DIAGNOSIS — F419 Anxiety disorder, unspecified: Secondary | ICD-10-CM | POA: Diagnosis not present

## 2017-05-15 DIAGNOSIS — E119 Type 2 diabetes mellitus without complications: Secondary | ICD-10-CM | POA: Diagnosis not present

## 2017-05-15 DIAGNOSIS — K829 Disease of gallbladder, unspecified: Secondary | ICD-10-CM | POA: Diagnosis not present

## 2017-05-15 DIAGNOSIS — F329 Major depressive disorder, single episode, unspecified: Secondary | ICD-10-CM | POA: Diagnosis not present

## 2017-05-15 DIAGNOSIS — Z9221 Personal history of antineoplastic chemotherapy: Secondary | ICD-10-CM | POA: Diagnosis not present

## 2017-05-15 DIAGNOSIS — Z853 Personal history of malignant neoplasm of breast: Secondary | ICD-10-CM | POA: Diagnosis not present

## 2017-05-15 HISTORY — DX: Type 2 diabetes mellitus without complications: E11.9

## 2017-05-15 LAB — COMPREHENSIVE METABOLIC PANEL
ALBUMIN: 3.8 g/dL (ref 3.5–5.0)
ALT: 24 U/L (ref 14–54)
ANION GAP: 9 (ref 5–15)
AST: 28 U/L (ref 15–41)
Alkaline Phosphatase: 79 U/L (ref 38–126)
BUN: 10 mg/dL (ref 6–20)
CHLORIDE: 103 mmol/L (ref 101–111)
CO2: 27 mmol/L (ref 22–32)
Calcium: 9.2 mg/dL (ref 8.9–10.3)
Creatinine, Ser: 0.73 mg/dL (ref 0.44–1.00)
GFR calc Af Amer: >60 mL/min (ref >60)
GLUCOSE: 115 mg/dL — AB (ref 65–99)
POTASSIUM: 3.6 mmol/L (ref 3.5–5.1)
Sodium: 139 mmol/L (ref 135–145)
Total Bilirubin: 0.5 mg/dL (ref 0.3–1.2)
Total Protein: 6.3 g/dL — ABNORMAL LOW (ref 6.5–8.1)

## 2017-05-15 LAB — CBC WITH DIFFERENTIAL/PLATELET
BASOS ABS: 0 10*3/uL (ref 0.0–0.1)
BASOS PCT: 1 %
EOS PCT: 4 %
Eosinophils Absolute: 0.2 10*3/uL (ref 0.0–0.7)
HCT: 36.7 % (ref 36.0–46.0)
Hemoglobin: 11.6 g/dL — ABNORMAL LOW (ref 12.0–15.0)
Lymphocytes Relative: 37 %
Lymphs Abs: 1.8 10*3/uL (ref 0.7–4.0)
MCH: 27.6 pg (ref 26.0–34.0)
MCHC: 31.6 g/dL (ref 30.0–36.0)
MCV: 87.2 fL (ref 78.0–100.0)
MONO ABS: 0.4 10*3/uL (ref 0.1–1.0)
Monocytes Relative: 9 %
Neutro Abs: 2.5 10*3/uL (ref 1.7–7.7)
Neutrophils Relative %: 49 %
PLATELETS: 181 10*3/uL (ref 150–400)
RBC: 4.21 MIL/uL (ref 3.87–5.11)
RDW: 14.8 % (ref 11.5–15.5)
WBC: 5 10*3/uL (ref 4.0–10.5)

## 2017-05-15 LAB — HEMOGLOBIN A1C
HEMOGLOBIN A1C: 7 % — AB (ref 4.8–5.6)
Mean Plasma Glucose: 154.2 mg/dL

## 2017-05-15 LAB — GLUCOSE, CAPILLARY: GLUCOSE-CAPILLARY: 130 mg/dL — AB (ref 65–99)

## 2017-05-15 MED ORDER — CHLORHEXIDINE GLUCONATE CLOTH 2 % EX PADS: 6.0000 | MEDICATED_PAD | Freq: Once | CUTANEOUS | Status: DC

## 2017-05-15 NOTE — H&P (Signed)
Lori Vincent  Location: [facility] Patient #: [id number] DOB: [birth year] Widowed / Language: Lori Molt / Race: White Female  History of Present Illness   The patient is a 61 year old female who presents with breast cancer.  Her PCP is Dr. Hunt Vincent.   Oncology - Dr. Glendora Vincent. Lori Vincent  She comes by herself.  I have not seen Lori Vincent since 2015/07/19. From the breast standpoint, she is doing well. She is doing okay with the tamoxifen. She does get some hot flashes. Lori Vincent - 06/06/2016 - negative.  But the primay reason she is here is for recurrent RUQ abdominal pain. Over the last year - she has had RUQ pain, particularly after lunch. She calls it an "attack". She says that she has 10 to 15 attacks a week. then she'll go a couple of weeks without an atttach It is not related to particular foods. She has had no fever or jaundice. Standing seems to improve the pain. She was taking too many Advil and Alever for some knee and foot pain. She has cut this out. She said that her H. Pylori test was negative. I have notes from Lori Vincent - 03/14/2017 - who has seen her for this problem. An abdominal US (Lori Vincent) on 03/17/2017 showed an echogenic focus in the neck of the gall bladder. She had an upper endo on 07/08/2016 by Lori Vincent which was negative. She had a HIDA scan on 07/25/2016 (at Lori Vincent) which showed a 93% ejection fraction.  I discussed with the patient the indications and risks of gall bladder surgery. The primary risks of gall bladder surgery include, but are not limited to, bleeding, infection, common bile duct injury, and open surgery. There is also the risk that the patient may have continued symptoms after surgery. I think the hardest thing to answer is whether the surgery will improve her symptoms. I told her that I thought she would have a 50 - 60% chance that her  symptoms would improve. Lori Vincent has done a thorough job looking for other causes of her abdominal pain and has found none. We discussed the typical post-operative recovery course. I tried to answer the patient's questions. I gave the patient literature about gall bladder surgery.  Plan: She wants to go ahead with gall bladder surgery. Will try to schedule this before the end of the year.  Past Medical History: 1. Left breast cancer, 6 o'clock (T2, N0) Invasive ductal carcinoma on biopsy, ER - 97%, PR - 75%, Ki67 - 44%, Her2Neu - Positive MRI - 06/08/2013 - 2.9 x 2.1 x1.9 cm Left breast lumpecotmy and left axillary SNLBx - 07/26/2013 Final path Invasive ductal ca, Grade 3, 3.0 cm, 0/2 nodes. Received chemotherapy.  Finished rad tx August 2015.  On tamoxifen 2. On thyroid replacement 3. History of depression 4. Benign moles x 3 removed from below right breast - 07/26/2013 5. She is thinking about getting a chin lift.  I gave her the name of several plastic surgeons in Dec 2015. She saw Dr. Harlow Vincent. 6. Power port placed, right subclavian - 07/26/2013 - Lori Vincent Removed 09/12/2014 7. Genetics were negative - Lori Vincent - 02/20/2015 8. Diabetes mellitus Last HgbA1C - 8 She has been started on Metformin by Dr. Brigitte Vincent 9. she had a BSO/lap hysterectomy in May 2018 by Dr. Lawson Vincent  SOCIAL and FAMILY HISTORY: Husband died 07-18-2009 secondary to leg sarcoma. He was taken care over  at [name]. She has one daughter age 61. Has two step children at college. 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  She has a friend that she went down to [facility] in Feb 2014, right before her breast surgery. She even delayed her surgery briefly for this.   Allergies (Lori Vincent, CMA; 04/16/2017 12:39 PM) Compazine *ANTIPSYCHOTICS/ANTIMANIC AGENTS*   Medication History (Lori Vincent, CMA; 04/16/2017 12:39 PM) Belviq (10MG  Tablet, Oral two times daily) Active. Citalopram Hydrobromide (20MG Tablet, Oral) Active. Gabapentin (300MG Capsule, Oral) Active. Omeprazole (40MG Capsule DR, Oral) Active. Potassium Chloride Crys ER (20MEQ Tablet ER, Oral) Active. Synthroid (112MCG Tablet, Oral) Active. Tamoxifen Citrate (20MG Tablet, Oral) Active. Dexamethasone (4MG Tablet, Oral) Active. LORazepam (0.5MG Tablet, Oral) Active. Medications Reconciled  Vitals (Lori Vincent CMA; 04/16/2017 12:40 PM) 04/16/2017 12:39 PM Weight: 147.13 lb Height: 67in Body Surface Area: 1.77 m Body Mass Index: 23.04 kg/m  Temp.: 98.10F(Oral)  Vincent: 101 (Regular)  BP: 142/82 (Sitting, Left Arm, Standard)  Physical Exam  General: WN WF who is alert and cooperative. HEENT: Pupils equal. Dentition good. NECK: Supple. No thyroid mass.  LYMPH NODES: No cervical, supraclavicular, or axillary adenopathy.  Lungs: Clear, symmetric breath sounds. Heart: RRR  BREASTS - RIGHT: No palpable mass or nodule. No nipple discharge. Right UIQ power port incision looks okay.  LEFT: No palpable mass or nodule. No nipple discharge. She has mild depigmentation of the left nipple. It looks like a negative of the right nipple.   She complains of some tenderness in her left breast still. Though it is vague and not specific. There is not "lump" associated with the pain.  Abdomen: No mass or nodule. She has a scar at her umbilicus from her hysterectomy.  She points to her RUQ as the location of the pain. I feel no mass. She has no symptoms today.  UPPER EXTREMITIES: No evidence of lymphedema.  Assessment & Plan  1.  BREAST CANCER, STAGE 2, LEFT (C50.912)  Story: Left breast cancer, 6 o'clock (T2, N0) Invasive ductal carcinoma on biopsy, ER - 97%, PR - 75%, Ki67 - 44%, Her2Neu - Positive   Left breast lumpecotmy and left axillary SNLBx - 07/26/2013  Final path Invasive ductal ca, Grade 3, 3.0 cm, 0/2  nodes.   Oncology - Dr. Glendora Vincent. [place]  2.  GALL BLADDER DISEASE (K82.9) Impression: Discussed gall bladder surgery  Plan:  1) cholecystectomy with cholangiogram  3. On thyroid replacement 4. History of depression 5. Diabetes mellitus Last HgbA1C - 8 She has been started on Metformin by Dr. Brigitte Vincent 6. she had a BSO/lap hysterectomy in May 2018 by Dr. Jerilynn Mages. Jearld Pies, MD, [facility] Pager: [phone] Office phone:  [phone]

## 2017-05-16 ENCOUNTER — Ambulatory Visit (HOSPITAL_COMMUNITY): Payer: BLUE CROSS/BLUE SHIELD | Admitting: Anesthesiology

## 2017-05-16 ENCOUNTER — Ambulatory Visit (HOSPITAL_COMMUNITY): Payer: BLUE CROSS/BLUE SHIELD | Admitting: Emergency Medicine

## 2017-05-16 ENCOUNTER — Encounter (HOSPITAL_COMMUNITY): Payer: Self-pay | Admitting: *Deleted

## 2017-05-16 ENCOUNTER — Ambulatory Visit (HOSPITAL_COMMUNITY): Payer: BLUE CROSS/BLUE SHIELD

## 2017-05-16 ENCOUNTER — Encounter (HOSPITAL_COMMUNITY)
Admission: RE | Disposition: A | Discharge status: Home / Self Care | Payer: Self-pay | Source: Ambulatory Visit | Attending: Surgery

## 2017-05-16 ENCOUNTER — Ambulatory Visit (HOSPITAL_COMMUNITY)
Admission: RE | Admit: 2017-05-16 | Discharge: 2017-05-16 | Disposition: A | Discharge status: Home / Self Care | Payer: BLUE CROSS/BLUE SHIELD | Source: Ambulatory Visit | Attending: Surgery | Admitting: Surgery

## 2017-05-16 DIAGNOSIS — K811 Chronic cholecystitis: Secondary | ICD-10-CM | POA: Diagnosis not present

## 2017-05-16 DIAGNOSIS — Z9221 Personal history of antineoplastic chemotherapy: Secondary | ICD-10-CM | POA: Insufficient documentation

## 2017-05-16 DIAGNOSIS — F329 Major depressive disorder, single episode, unspecified: Secondary | ICD-10-CM | POA: Insufficient documentation

## 2017-05-16 DIAGNOSIS — F419 Anxiety disorder, unspecified: Secondary | ICD-10-CM | POA: Insufficient documentation

## 2017-05-16 DIAGNOSIS — Z853 Personal history of malignant neoplasm of breast: Secondary | ICD-10-CM | POA: Insufficient documentation

## 2017-05-16 DIAGNOSIS — Z419 Encounter for procedure for purposes other than remedying health state, unspecified: Secondary | ICD-10-CM

## 2017-05-16 DIAGNOSIS — E119 Type 2 diabetes mellitus without complications: Secondary | ICD-10-CM | POA: Diagnosis not present

## 2017-05-16 DIAGNOSIS — C50512 Malignant neoplasm of lower-outer quadrant of left female breast: Secondary | ICD-10-CM | POA: Diagnosis not present

## 2017-05-16 DIAGNOSIS — K649 Unspecified hemorrhoids: Secondary | ICD-10-CM | POA: Diagnosis not present

## 2017-05-16 DIAGNOSIS — Z7984 Long term (current) use of oral hypoglycemic drugs: Secondary | ICD-10-CM | POA: Insufficient documentation

## 2017-05-16 DIAGNOSIS — K829 Disease of gallbladder, unspecified: Secondary | ICD-10-CM | POA: Insufficient documentation

## 2017-05-16 DIAGNOSIS — K219 Gastro-esophageal reflux disease without esophagitis: Secondary | ICD-10-CM | POA: Diagnosis not present

## 2017-05-16 HISTORY — PX: CHOLECYSTECTOMY: SHX55

## 2017-05-16 LAB — GLUCOSE, CAPILLARY
GLUCOSE-CAPILLARY: 221 mg/dL — AB (ref 65–99)
GLUCOSE-CAPILLARY: 88 mg/dL (ref 65–99)

## 2017-05-16 SURGERY — LAPAROSCOPIC CHOLECYSTECTOMY WITH INTRAOPERATIVE CHOLANGIOGRAM
Anesthesia: General | Site: Abdomen

## 2017-05-16 MED ORDER — KETAMINE HCL 10 MG/ML IJ SOLN
INTRAMUSCULAR | Status: DC | PRN
  Administered 2017-05-16: 20 mg via INTRAVENOUS

## 2017-05-16 MED ORDER — ONDANSETRON HCL 4 MG/2ML IJ SOLN
INTRAMUSCULAR | Status: DC | PRN
  Administered 2017-05-16: 4 mg via INTRAVENOUS

## 2017-05-16 MED ORDER — SODIUM CHLORIDE 0.9 % IR SOLN
Status: DC | PRN
  Administered 2017-05-16: 1000 mL

## 2017-05-16 MED ORDER — ROCURONIUM BROMIDE 100 MG/10ML IV SOLN
INTRAVENOUS | Status: DC | PRN
  Administered 2017-05-16: 50 mg via INTRAVENOUS

## 2017-05-16 MED ORDER — HYDROMORPHONE HCL 1 MG/ML IJ SOLN
0.2500 mg | INTRAMUSCULAR | Status: DC | PRN
  Administered 2017-05-16 (×2): 0.5 mg via INTRAVENOUS

## 2017-05-16 MED ORDER — GABAPENTIN 300 MG PO CAPS
ORAL_CAPSULE | ORAL | Status: AC
  Administered 2017-05-16: 300 mg via ORAL
  Filled 2017-05-16: qty 1

## 2017-05-16 MED ORDER — SUGAMMADEX SODIUM 200 MG/2ML IV SOLN
INTRAVENOUS | Status: AC
  Filled 2017-05-16: qty 2

## 2017-05-16 MED ORDER — DEXAMETHASONE SODIUM PHOSPHATE 10 MG/ML IJ SOLN
INTRAMUSCULAR | Status: AC
  Filled 2017-05-16: qty 1

## 2017-05-16 MED ORDER — SUGAMMADEX SODIUM 500 MG/5ML IV SOLN
INTRAVENOUS | Status: DC | PRN
  Administered 2017-05-16: 300 mg via INTRAVENOUS

## 2017-05-16 MED ORDER — LIDOCAINE HCL (CARDIAC) 20 MG/ML IV SOLN
INTRAVENOUS | Status: DC | PRN
  Administered 2017-05-16: 60 mg via INTRAVENOUS

## 2017-05-16 MED ORDER — HYDROCODONE-ACETAMINOPHEN 5-325 MG PO TABS: 1.0000 | ORAL_TABLET | Freq: Four times a day (QID) | ORAL | 0 refills | Status: DC | PRN

## 2017-05-16 MED ORDER — MIDAZOLAM HCL 2 MG/2ML IJ SOLN
INTRAMUSCULAR | Status: AC
Start: 2017-05-16 — End: 2017-05-16
  Filled 2017-05-16: qty 2

## 2017-05-16 MED ORDER — PROPOFOL 10 MG/ML IV BOLUS
INTRAVENOUS | Status: DC | PRN
  Administered 2017-05-16: 170 mg via INTRAVENOUS

## 2017-05-16 MED ORDER — DEXAMETHASONE SODIUM PHOSPHATE 10 MG/ML IJ SOLN
INTRAMUSCULAR | Status: DC | PRN
  Administered 2017-05-16: 10 mg via INTRAVENOUS

## 2017-05-16 MED ORDER — IOPAMIDOL (ISOVUE-300) INJECTION 61%
INTRAVENOUS | Status: DC | PRN
  Administered 2017-05-16: 10 mL

## 2017-05-16 MED ORDER — METOCLOPRAMIDE HCL 5 MG/ML IJ SOLN
INTRAMUSCULAR | Status: DC | PRN
  Administered 2017-05-16: 5 mg via INTRAVENOUS

## 2017-05-16 MED ORDER — OXYCODONE HCL 5 MG PO TABS: 5.0000 mg | ORAL_TABLET | Freq: Once | ORAL | Status: DC | PRN

## 2017-05-16 MED ORDER — ROCURONIUM BROMIDE 10 MG/ML (PF) SYRINGE
PREFILLED_SYRINGE | INTRAVENOUS | Status: AC
  Filled 2017-05-16: qty 5

## 2017-05-16 MED ORDER — FENTANYL CITRATE (PF) 100 MCG/2ML IJ SOLN
INTRAMUSCULAR | Status: DC | PRN
Start: 2017-05-16 — End: 2017-05-16
  Administered 2017-05-16: 100 ug via INTRAVENOUS
  Administered 2017-05-16 (×2): 50 ug via INTRAVENOUS

## 2017-05-16 MED ORDER — FAMOTIDINE IN NACL 20-0.9 MG/50ML-% IV SOLN
20.0000 mg | Freq: Once | INTRAVENOUS | Status: AC
  Administered 2017-05-16: 20 mg via INTRAVENOUS
  Filled 2017-05-16: qty 50

## 2017-05-16 MED ORDER — OXYCODONE HCL 5 MG/5ML PO SOLN: 5.0000 mg | Freq: Once | ORAL | Status: DC | PRN

## 2017-05-16 MED ORDER — PHENYLEPHRINE HCL 10 MG/ML IJ SOLN
INTRAMUSCULAR | Status: DC | PRN
  Administered 2017-05-16: 40 ug via INTRAVENOUS

## 2017-05-16 MED ORDER — GABAPENTIN 300 MG PO CAPS
300.0000 mg | ORAL_CAPSULE | ORAL | Status: AC
  Administered 2017-05-16: 300 mg via ORAL

## 2017-05-16 MED ORDER — HYDROMORPHONE HCL 1 MG/ML IJ SOLN
INTRAMUSCULAR | Status: AC
  Filled 2017-05-16: qty 1

## 2017-05-16 MED ORDER — IOPAMIDOL (ISOVUE-300) INJECTION 61%
INTRAVENOUS | Status: AC
  Filled 2017-05-16: qty 50

## 2017-05-16 MED ORDER — 0.9 % SODIUM CHLORIDE (POUR BTL) OPTIME
TOPICAL | Status: DC | PRN
  Administered 2017-05-16: 1000 mL

## 2017-05-16 MED ORDER — ONDANSETRON HCL 4 MG/2ML IJ SOLN
INTRAMUSCULAR | Status: AC
  Filled 2017-05-16: qty 2

## 2017-05-16 MED ORDER — SCOPOLAMINE 1 MG/3DAYS TD PT72
1.0000 | MEDICATED_PATCH | TRANSDERMAL | Status: DC
  Administered 2017-05-16: 1.5 mg via TRANSDERMAL
  Filled 2017-05-16: qty 1

## 2017-05-16 MED ORDER — LACTATED RINGERS IV SOLN
INTRAVENOUS | Status: DC
  Administered 2017-05-16: 14:00:00 via INTRAVENOUS

## 2017-05-16 MED ORDER — LIDOCAINE 2% (20 MG/ML) 5 ML SYRINGE
INTRAMUSCULAR | Status: DC | PRN
  Administered 2017-05-16: 1 mg/kg/h via INTRAVENOUS

## 2017-05-16 MED ORDER — BUPIVACAINE-EPINEPHRINE (PF) 0.25% -1:200000 IJ SOLN
INTRAMUSCULAR | Status: AC
  Filled 2017-05-16: qty 30

## 2017-05-16 MED ORDER — PROPOFOL 10 MG/ML IV BOLUS
INTRAVENOUS | Status: AC
  Filled 2017-05-16: qty 20

## 2017-05-16 MED ORDER — KETAMINE HCL-SODIUM CHLORIDE 100-0.9 MG/10ML-% IV SOSY
PREFILLED_SYRINGE | INTRAVENOUS | Status: AC
  Filled 2017-05-16: qty 10

## 2017-05-16 MED ORDER — CEFAZOLIN SODIUM-DEXTROSE 2-4 GM/100ML-% IV SOLN
INTRAVENOUS | Status: AC
  Filled 2017-05-16: qty 100

## 2017-05-16 MED ORDER — ACETAMINOPHEN 500 MG PO TABS
1000.0000 mg | ORAL_TABLET | ORAL | Status: AC
  Administered 2017-05-16: 1000 mg via ORAL

## 2017-05-16 MED ORDER — FENTANYL CITRATE (PF) 250 MCG/5ML IJ SOLN
INTRAMUSCULAR | Status: AC
Start: 2017-05-16 — End: 2017-05-16
  Filled 2017-05-16: qty 5

## 2017-05-16 MED ORDER — BUPIVACAINE-EPINEPHRINE 0.25% -1:200000 IJ SOLN
INTRAMUSCULAR | Status: DC | PRN
  Administered 2017-05-16: 30 mL

## 2017-05-16 MED ORDER — MIDAZOLAM HCL 5 MG/5ML IJ SOLN
INTRAMUSCULAR | Status: DC | PRN
  Administered 2017-05-16 (×2): 1 mg via INTRAVENOUS

## 2017-05-16 MED ORDER — CEFAZOLIN SODIUM-DEXTROSE 2-4 GM/100ML-% IV SOLN
2.0000 g | INTRAVENOUS | Status: AC
  Administered 2017-05-16: 2 g via INTRAVENOUS

## 2017-05-16 MED ORDER — LIDOCAINE 2% (20 MG/ML) 5 ML SYRINGE
INTRAMUSCULAR | Status: AC
  Filled 2017-05-16: qty 5

## 2017-05-16 MED ORDER — ACETAMINOPHEN 500 MG PO TABS
ORAL_TABLET | ORAL | Status: AC
  Administered 2017-05-16: 1000 mg via ORAL
  Filled 2017-05-16: qty 2

## 2017-05-16 SURGICAL SUPPLY — 41 items
APPLIER CLIP 5 13 M/L LIGAMAX5 (MISCELLANEOUS)
APPLIER CLIP ROT 10 11.4 M/L (STAPLE) ×2
BLADE CLIPPER SURG (BLADE) IMPLANT
CANISTER SUCT 3000ML PPV (MISCELLANEOUS) ×2 IMPLANT
CHLORAPREP W/TINT 26ML (MISCELLANEOUS) ×2 IMPLANT
CHOLANGIOGRAM CATH TAUT (CATHETERS) ×2 IMPLANT
CLIP APPLIE 5 13 M/L LIGAMAX5 (MISCELLANEOUS) IMPLANT
CLIP APPLIE ROT 10 11.4 M/L (STAPLE) ×1 IMPLANT
COVER MAYO STAND STRL (DRAPES) ×2 IMPLANT
COVER SURGICAL LIGHT HANDLE (MISCELLANEOUS) ×2 IMPLANT
DERMABOND ADVANCED (GAUZE/BANDAGES/DRESSINGS) ×1
DERMABOND ADVANCED .7 DNX12 (GAUZE/BANDAGES/DRESSINGS) ×1 IMPLANT
DRAPE C-ARM 42X72 X-RAY (DRAPES) ×2 IMPLANT
ELECT REM PT RETURN 9FT ADLT (ELECTROSURGICAL) ×2
ELECTRODE REM PT RTRN 9FT ADLT (ELECTROSURGICAL) ×1 IMPLANT
FILTER SMOKE EVAC LAPAROSHD (FILTER) ×2 IMPLANT
GLOVE SURG SIGNA 7.5 PF LTX (GLOVE) ×2 IMPLANT
GOWN STRL REUS W/ TWL LRG LVL3 (GOWN DISPOSABLE) ×2 IMPLANT
GOWN STRL REUS W/ TWL XL LVL3 (GOWN DISPOSABLE) ×1 IMPLANT
GOWN STRL REUS W/TWL LRG LVL3 (GOWN DISPOSABLE) ×2
GOWN STRL REUS W/TWL XL LVL3 (GOWN DISPOSABLE) ×1
IV CATH 14GX2 1/4 (CATHETERS) ×2 IMPLANT
KIT BASIN OR (CUSTOM PROCEDURE TRAY) ×2 IMPLANT
KIT ROOM TURNOVER OR (KITS) ×2 IMPLANT
NS IRRIG 1000ML POUR BTL (IV SOLUTION) ×2 IMPLANT
PAD ARMBOARD 7.5X6 YLW CONV (MISCELLANEOUS) ×2 IMPLANT
POUCH SPECIMEN RETRIEVAL 10MM (ENDOMECHANICALS) ×2 IMPLANT
SCISSORS LAP 5X35 DISP (ENDOMECHANICALS) ×2 IMPLANT
SET IRRIG TUBING LAPAROSCOPIC (IRRIGATION / IRRIGATOR) ×2 IMPLANT
SLEEVE ENDOPATH XCEL 5M (ENDOMECHANICALS) ×2 IMPLANT
SPECIMEN JAR SMALL (MISCELLANEOUS) ×2 IMPLANT
STOPCOCK 4 WAY LG BORE MALE ST (IV SETS) ×2 IMPLANT
SUT MNCRL AB 4-0 PS2 18 (SUTURE) ×2 IMPLANT
TOWEL OR 17X24 6PK STRL BLUE (TOWEL DISPOSABLE) ×2 IMPLANT
TOWEL OR 17X26 10 PK STRL BLUE (TOWEL DISPOSABLE) ×2 IMPLANT
TRAY LAPAROSCOPIC MC (CUSTOM PROCEDURE TRAY) ×2 IMPLANT
TROCAR XCEL BLUNT TIP 100MML (ENDOMECHANICALS) ×2 IMPLANT
TROCAR XCEL NON-BLD 11X100MML (ENDOMECHANICALS) ×2 IMPLANT
TROCAR XCEL NON-BLD 5MMX100MML (ENDOMECHANICALS) ×2 IMPLANT
TUBING EXTENTION W/L.L. (IV SETS) ×2 IMPLANT
TUBING INSUFFLATION (TUBING) ×2 IMPLANT

## 2017-05-16 NOTE — Anesthesia Preprocedure Evaluation (Addendum)
Anesthesia Evaluation  Patient identified by MRN, date of birth, ID band Patient awake    Reviewed: Allergy & Precautions, NPO status , Patient's Chart, lab work & pertinent test results  Airway Mallampati: II  TM Distance: >3 FB Neck ROM: Full    Dental no notable dental hx.    Pulmonary neg pulmonary ROS,    Pulmonary exam normal breath sounds clear to auscultation       Cardiovascular negative cardio ROS Normal cardiovascular exam Rhythm:Regular Rate:Normal  ECG: NSR, rate 88   Neuro/Psych Anxiety negative neurological ROS     GI/Hepatic negative GI ROS, Neg liver ROS,   Endo/Other  diabetes, Oral Hypoglycemic AgentsHypothyroidism   Renal/GU negative Renal ROS  negative genitourinary   Musculoskeletal   Abdominal (+) + obese,   Peds  Hematology  (+) anemia ,   Anesthesia Other Findings GALLBLADDER DISEASE H/o breast cancer s/p chemo  Reproductive/Obstetrics                                                            Anesthesia Evaluation  Patient identified by MRN, date of birth, ID band Patient awake    Reviewed: Allergy & Precautions, NPO status , Patient's Chart, lab work & pertinent test results  Airway Mallampati: I       Dental no notable dental hx.    Pulmonary neg pulmonary ROS,    Pulmonary exam normal        Cardiovascular Normal cardiovascular exam Rhythm:Regular Rate:Normal     Neuro/Psych negative neurological ROS     GI/Hepatic Neg liver ROS,   Endo/Other  Hypothyroidism   Renal/GU negative Renal ROS  negative genitourinary   Musculoskeletal   Abdominal (+) + obese,   Peds  Hematology   Anesthesia Other Findings   Reproductive/Obstetrics                           Anesthesia Physical Anesthesia Plan  ASA: II  Anesthesia Plan: General   Post-op Pain Management:    Induction: Intravenous  Airway  Management Planned: Oral ETT  Additional Equipment:   Intra-op Plan:   Post-operative Plan: Extubation in OR  Informed Consent: I have reviewed the patients History and Physical, chart, labs and discussed the procedure including the risks, benefits and alternatives for the proposed anesthesia with the patient or authorized representative who has indicated his/her understanding and acceptance.   Dental advisory given  Plan Discussed with: CRNA and Surgeon  Anesthesia Plan Comments:        Anesthesia Quick Evaluation  Anesthesia Physical Anesthesia Plan  ASA: III  Anesthesia Plan: General   Post-op Pain Management:    Induction: Intravenous  PONV Risk Score and Plan: 3 and Ondansetron, Dexamethasone, Midazolam and Treatment may vary due to age or medical condition  Airway Management Planned: Oral ETT  Additional Equipment:   Intra-op Plan:   Post-operative Plan: Extubation in OR  Informed Consent: I have reviewed the patients History and Physical, chart, labs and discussed the procedure including the risks, benefits and alternatives for the proposed anesthesia with the patient or authorized representative who has indicated his/her understanding and acceptance.   Dental advisory given  Plan Discussed with: CRNA  Anesthesia Plan Comments:        Anesthesia  Quick Evaluation

## 2017-05-16 NOTE — Transfer of Care (Signed)
Immediate Anesthesia Transfer of Care Note  Patient: DANAYSHA KIRN  Procedure(s) Performed: LAPAROSCOPIC CHOLECYSTECTOMY WITH INTRAOPERATIVE CHOLANGIOGRAM (N/A Abdomen)  Patient Location: PACU  Anesthesia Type:General  Level of Consciousness: awake, oriented, drowsy, patient cooperative and responds to stimulation  Airway & Oxygen Therapy: Patient Spontanous Breathing and Patient connected to face mask oxygen  Post-op Assessment: Report given to RN, Post -op Vital signs reviewed and stable and Patient moving all extremities  Post vital signs: Reviewed and stable  Last Vitals:  Vitals:   05/16/17 1151  BP: 138/73  Pulse: 86  Resp: 20  Temp: 37.2 C  SpO2: 98%    Last Pain:  Vitals:   05/16/17 1151  TempSrc: Oral         Complications: No apparent anesthesia complications

## 2017-05-16 NOTE — Discharge Instructions (Signed)
[facility] - DISCHARGE INSTRUCTIONS TO PATIENT  Activity:  Driving - May drive in 2 or 3 days, if doing well   Lifting - No lifting more than 15 pounds for 10 days, then no limit  Wound Care:   Leave incisions dry until Sunday, 12/23, then you may shower.  Diet:  As tolerated.  Follow up appointment:  Call Dr. Pollie Friar office [facility]) at [phone] for an appointment in 2 to 4 weeks.  Medications and dosages:  Resume your home medications.  You have a prescription for:  Vicodin  Call Dr. Lucia Gaskins or his office  [phone]) if you have:  Temperature greater than 100.4,  Persistent nausea and vomiting,  Severe uncontrolled pain,  Redness, tenderness, or signs of infection (pain, swelling, redness, odor or green/yellow discharge around the site),  Difficulty breathing, headache or visual disturbances,  Any other questions or concerns you may have after discharge.  In an emergency, call 911 or go to an Emergency Department at a nearby hospital.    General Anesthesia, Adult, Care After These instructions provide you with information about caring for yourself after your procedure. Your health care provider may also give you more specific instructions. Your treatment has been planned according to current medical practices, but problems sometimes occur. Call your health care provider if you have any problems or questions after your procedure. What can I expect after the procedure? After the procedure, it is common to have:  Vomiting.  A sore throat.  Mental slowness.  It is common to feel:  Nauseous.  Cold or shivery.  Sleepy.  Tired.  Sore or achy, even in parts of your body where you did not have surgery.  Follow these instructions at home: For at least 24 hours after the procedure:  Do not: ? Participate in activities where you could fall or become injured. ? Drive. ? Use heavy machinery. ? Drink alcohol. ? Take sleeping  pills or medicines that cause drowsiness. ? Make important decisions or sign legal documents. ? Take care of children on your own.  Rest. Eating and drinking  If you vomit, drink water, juice, or soup when you can drink without vomiting.  Drink enough fluid to keep your urine clear or pale yellow.  Make sure you have little or no nausea before eating solid foods.  Follow the diet recommended by your health care provider. General instructions  Have a responsible adult stay with you until you are awake and alert.  Return to your normal activities as told by your health care provider. Ask your health care provider what activities are safe for you.  Take over-the-counter and prescription medicines only as told by your health care provider.  If you smoke, do not smoke without supervision.  Keep all follow-up visits as told by your health care provider. This is important. Contact a health care provider if:  You continue to have nausea or vomiting at home, and medicines are not helpful.  You cannot drink fluids or start eating again.  You cannot urinate after 8-12 hours.  You develop a skin rash.  You have fever.  You have increasing redness at the site of your procedure. Get help right away if:  You have difficulty breathing.  You have chest pain.  You have unexpected bleeding.  You feel that you are having a life-threatening or urgent problem. This information is not intended to replace advice given to you by your health care provider. Make sure you discuss any questions  you have with your health care provider. Document Released: 08/19/2000 Document Revised: 10/16/2015 Document Reviewed: 04/27/2015 Elsevier Interactive Patient Education  [employer].

## 2017-05-16 NOTE — Anesthesia Postprocedure Evaluation (Signed)
Anesthesia Post Note  Patient: Lori Vincent  Procedure(s) Performed: LAPAROSCOPIC CHOLECYSTECTOMY WITH INTRAOPERATIVE CHOLANGIOGRAM (N/A Abdomen)     Patient location during evaluation: PACU Anesthesia Type: General Level of consciousness: awake and alert Pain management: pain level controlled Vital Signs Assessment: post-procedure vital signs reviewed and stable Respiratory status: spontaneous breathing, nonlabored ventilation, respiratory function stable and patient connected to nasal cannula oxygen Cardiovascular status: blood pressure returned to baseline and stable Postop Assessment: no apparent nausea or vomiting Anesthetic complications: no    Last Vitals:  Vitals:   05/16/17 1619 05/16/17 1630  BP: 112/63   Pulse: 75 74  Resp: 11 19  Temp:    SpO2: 100% 99%    Last Pain:  Vitals:   05/16/17 1630  TempSrc:   PainSc: 2                  Kyo Cocuzza P Juwan Vences

## 2017-05-16 NOTE — Interval H&P Note (Signed)
History and Physical Interval Note:  05/16/2017 1:44 PM  Lori Vincent  has presented today for surgery, with the diagnosis of GALLBLADDER DISEASE  The various methods of treatment have been discussed with the patient and family.  Her daughter in law is going to pick her up.  After consideration of risks, benefits and other options for treatment, the patient has consented to  Procedure(s): LAPAROSCOPIC CHOLECYSTECTOMY WITH INTRAOPERATIVE CHOLANGIOGRAM (N/A) as a surgical intervention .  The patient's history has been reviewed, patient examined, no change in status, stable for surgery.  I have reviewed the patient's chart and labs.  Questions were answered to the patient's satisfaction.     Shann Medal

## 2017-05-16 NOTE — Anesthesia Procedure Notes (Signed)
Procedure Name: Intubation Date/Time: 05/16/2017 1:57 PM Performed by: Murvin Natal, MD Pre-anesthesia Checklist: Patient identified, Emergency Drugs available, Suction available, Patient being monitored and Timeout performed Patient Re-evaluated:Patient Re-evaluated prior to induction Oxygen Delivery Method: Circle system utilized Preoxygenation: Pre-oxygenation with 100% oxygen Induction Type: IV induction and Cricoid Pressure applied Ventilation: Mask ventilation without difficulty Laryngoscope Size: Mac and 3 Grade View: Grade II Tube type: Oral Tube size: 7.5 mm Number of attempts: 1 Airway Equipment and Method: Stylet Placement Confirmation: ETT inserted through vocal cords under direct vision,  positive ETCO2 and breath sounds checked- equal and bilateral Secured at: 22 cm Tube secured with: Tape Dental Injury: Teeth and Oropharynx as per pre-operative assessment

## 2017-05-16 NOTE — Op Note (Signed)
05/16/2017  2:53 PM  PATIENT:  Lori Vincent, 61 y.o., female, MRN: [id number]  PREOP DIAGNOSIS:  GALLBLADDER DISEASE  POSTOP DIAGNOSIS:   Gall bladder disease  PROCEDURE:   Procedure(s): LAPAROSCOPIC CHOLECYSTECTOMY WITH INTRAOPERATIVE CHOLANGIOGRAM  SURGEON:   [name], M.D.  ASSISTANT:   None  ANESTHESIA:   general  Anesthesiologist: Murvin Natal, MD CRNA: Ofilia Neas, CRNA; Maness, Kathrin Penner, CRNA  General  ASA: 2  EBL:  minimal  ml  BLOOD ADMINISTERED: none  DRAINS: none   LOCAL MEDICATIONS USED:   30 cc of 1/4% marcaine  SPECIMEN:   Gall bladder  COUNTS CORRECT:  YES  INDICATIONS FOR PROCEDURE:  Lori Vincent is a 61 y.o. (DOB: [birth year]) white female whose primary care physician is Marton Redwood, MD and comes for cholecystectomy.   The indications and risks of the gall bladder surgery were explained to the patient.  The risks include, but are not limited to, infection, bleeding, common bile duct injury and open surgery.  SURGERY:  The patient was taken to OR room #2 at [facility].  The abdomen was prepped with chloroprep.  The patient was given 2 gm Ancef at the beginning of the operation.   A time out was held and the surgical checklist run.   An infraumbilical incision was made into the abdominal cavity.  A 12 mm Hasson trocar was inserted into the abdominal cavity through the infraumbilical incision and secured with a 0 Vicryl suture.  Three additional trocars were inserted: a 10 mm trocar in the sub-xiphoid location, a 5 mm trocar in the right mid subcostal area, and a 5 mm trocar in the right lateral subcostal area.   The abdomen was explored and the liver, stomach, and bowel that could be seen were unremarkable.   The gall bladder had maybe some mild evidence of chronic cholecystitis.   I grasped the gall bladder and rotated it cephalad.  Disssection was carried down to the gall bladder/cystic duct junction and the cystic duct  isolated.  A clip was placed on the gall bladder side of the cystic duct.   An intra-operative cholangiogram was shot.   The intra-operative cholangiogram was shot using a cut off Taut catheter placed through a 14 gauge angiocath in the RUQ.  The Taut catheter was inserted in the cut cystic duct and secured with an endoclip.  A cholangiogram was shot with 8 cc of 1/2 strength Isoview.  Using fluoroscopy, the cholangiogram showed the flow of contrast into the common bile duct, up the hepatic radicals, and into the duodenum.  There was no mass or obstruction.  This was a normal intra-operative cholangiogram.   The Taut catheter was removed.  The cystic duct was tripley endoclipped and the cystic artery was identified and clipped.  The gall bladder was bluntly and sharpley dissected from the gall bladder bed.   After the gall bladder was removed from the liver, the gall bladder bed and Tri[place] were inspected.  There was no bleeding or bile leak.  The gall bladder was placed in a endocatch bag and delivered through the umbilicus.  The abdomen was irrigated with 500 cc saline.   The trocars were then removed.  I infiltrated 30cc of 1/4% Marcaine into the incisions.  The umbilical port closed with a 0 Vicryl suture and the skin closed with 4-0 Monocryl.  The skin was painted with DermaBond.  The patient's sponge and needle count were correct.  The patient  was transported to the [facility] in good condition.   She will go home today.  [name], MD, [facility] Surgery Pager: [phone] Office phone:  [phone]

## 2017-05-17 ENCOUNTER — Encounter (HOSPITAL_COMMUNITY): Payer: Self-pay | Admitting: Surgery

## 2017-06-12 ENCOUNTER — Ambulatory Visit
Admission: RE | Admit: 2017-06-12 | Discharge: 2017-06-12 | Disposition: A | Discharge status: Home / Self Care | Payer: BLUE CROSS/BLUE SHIELD | Source: Ambulatory Visit | Attending: Oncology | Admitting: Oncology

## 2017-06-12 DIAGNOSIS — Z853 Personal history of malignant neoplasm of breast: Secondary | ICD-10-CM

## 2017-06-12 DIAGNOSIS — R922 Inconclusive mammogram: Secondary | ICD-10-CM | POA: Diagnosis not present

## 2017-06-12 HISTORY — DX: Personal history of antineoplastic chemotherapy: Z92.21

## 2017-06-12 HISTORY — DX: Personal history of irradiation: Z92.3

## 2017-06-27 DIAGNOSIS — E119 Type 2 diabetes mellitus without complications: Secondary | ICD-10-CM | POA: Diagnosis not present

## 2017-06-27 DIAGNOSIS — E038 Other specified hypothyroidism: Secondary | ICD-10-CM | POA: Diagnosis not present

## 2017-06-27 DIAGNOSIS — C50912 Malignant neoplasm of unspecified site of left female breast: Secondary | ICD-10-CM | POA: Diagnosis not present

## 2017-06-27 DIAGNOSIS — E668 Other obesity: Secondary | ICD-10-CM | POA: Diagnosis not present

## 2017-07-08 ENCOUNTER — Other Ambulatory Visit: Payer: BLUE CROSS/BLUE SHIELD

## 2017-07-08 ENCOUNTER — Ambulatory Visit: Payer: BLUE CROSS/BLUE SHIELD | Admitting: Oncology

## 2017-07-18 ENCOUNTER — Other Ambulatory Visit: Payer: Self-pay | Admitting: *Deleted

## 2017-07-18 DIAGNOSIS — C50512 Malignant neoplasm of lower-outer quadrant of left female breast: Secondary | ICD-10-CM

## 2017-07-18 DIAGNOSIS — Z170 Estrogen receptor positive status [ER+]: Principal | ICD-10-CM

## 2017-07-18 NOTE — Progress Notes (Signed)
[place]  [phone] [phone]     ID: Lori Vincent DOB: 05/08/1956  MR#: [id number]  QQV#:[id number]  PCP: Marton Redwood, MD GYN:  [name], MD SU: [name], MD OTHER MD: Arloa Koh, MD  CHIEF COMPLAINT: estrogen receptor positive breast cancer  CURRENT TREATMENT: tamoxifen   BREAST CANCER HISTORY: From the original intake note:  Lori Vincent [facility] showing a possible mass in the left breast. Diagnostic left mammography and ultrasonography at the breast center 04/26/2013 confirmed an oval mass with microcalcifications in the 6:00 position of the left breast. There was an adjacent partially obscured 1.2 cm mass. On physical exam there was no palpable mass in the left breast. By ultrasonography, there was an oval mass at the 6:00 position measuring 1.4 cm, with a second mass measuring 1.1 cm. There were no abnormal lymph nodes noted in the left axilla.  Biopsy of the 6:00 mass in the left breast January 2015 showed ([id number]) and invasive ductal carcinoma, grade 3, estrogen receptor 97% positive, progesterone receptor 75% positive, with strong staining intensity, and MIB-144%. HER-2 was amplified, with the signals ratio 4.18 and in number per cell of 9.40 .  On 06/08/2013 the patient underwent bilateral breast MRI which showed a dumbbell-shaped area in the central left breast consisting of a 2 cm mass and 2 extensions or an adjacent masses, measuring a total of 2.9 cm. There were no abnormal appearing lymph nodes for any other areas of concern.  The patient's subsequent history is as detailed below.  INTERVAL HISTORY: Lori Vincent returns today for follow-up of her estrogen receptor positive breast cancer. She continues on tamoxifen, with good tolerance. She denies issues with hot flashes or vaginal dryness. She notes that since having a hysterectomy, she discontinued using the  vaginal estrogens. She notes that a loss in libido, which has been an issue for her. She notes that she has completed pelvic health classes.  Since her last visit, she underwent diagnostic bilateral mammography with CAD and tomography on 06/12/2017 at [facility] showing: breast density category C. There was no evidence of malignancy.   In May 2018, she had a total hysterectomy with bilateral salpingo oophorectomy. She also had a cholecystetomy in December 2018.   REVIEW OF SYSTEMS: Lori Vincent reports that she is trying to improve her health. She notes that during a follow up visit with Dr. Brigitte Pulse, she found out that she was pre-diabetic, which was surprising for her. Since then, she is making healthier choices with food and eating smaller portions. She notes that she is going to yoga and walking more. She notes that she would like to do more exercise classes, but she gets tired easily. She notes that she changed her lifestyle and outlook on life. She denies unusual headaches, visual changes, nausea, vomiting, or dizziness. There has been no unusual cough, phlegm production, or pleurisy. This been no change in bowel or bladder habits. She denies unexplained fatigue or unexplained weight loss, bleeding, rash, or fever. A detailed review of systems was otherwise Vincent.   PAST MEDICAL HISTORY: Past Medical History:  Diagnosis Date  . Anemia due to chemotherapy   . Anxiety   . Arthritis    knees and feet  . Breast cancer ([facility]) 06/03/13   left, 6:00 o'clock  . Diabetes mellitus without complication ([facility])   . Family history of colon cancer   . History of blood transfusion 12/2013   [facility] -  2 units transfused - during chemo  . History of radiation therapy 01/18/14- 03/02/14   left breast 4680 cGy 26 sessions, boost of 1000 cGy 5 sessions  . Hypokalemia    during with chemo 08/2013  . Hypothyroidism   . Neuropathy    bottom of feet, mild  . Personal history of chemotherapy   . Personal history of  radiation therapy     PAST SURGICAL HISTORY: Past Surgical History:  Procedure Laterality Date  . BREAST BIOPSY  1/14   lt  . BREAST LUMPECTOMY WITH NEEDLE LOCALIZATION AND AXILLARY SENTINEL LYMPH NODE BX Left 07/26/2013   Procedure: BREAST LUMPECTOMY WITH NEEDLE LOCALIZATION AND AXILLARY SENTINEL LYMPH NODE BX;  Surgeon: Shann Medal, MD;  Location: [facility];  Service: General;  Laterality: Left;  . CESAREAN SECTION  04/1999  . CHOLECYSTECTOMY N/A 05/16/2017   Procedure: LAPAROSCOPIC CHOLECYSTECTOMY WITH INTRAOPERATIVE CHOLANGIOGRAM;  Surgeon: [name], MD;  Location: [facility];  Service: General;  Laterality: N/A;  . COLONOSCOPY  2013  . DIAGNOSTIC LAPAROSCOPY     laser endometriosis  . DILATION AND CURETTAGE OF UTERUS    . DILITATION & CURRETTAGE/HYSTROSCOPY WITH HYDROTHERMAL ABLATION N/A 09/02/2014   Procedure: DILATATION & CURETTAGE/HYSTEROSCOPY WITH HYDROTHERMAL ABLATION;  Surgeon: Dian Queen, MD;  Location: [facility];  Service: Gynecology;  Laterality: N/A;  . LAPAROSCOPIC VAGINAL HYSTERECTOMY WITH SALPINGO OOPHORECTOMY Bilateral 09/30/2016   Procedure: LAPAROSCOPIC ASSISTED VAGINAL HYSTERECTOMY WITH BILATERAL SALPINGO OOPHORECTOMY;  Surgeon: Dian Queen, MD;  Location: [facility];  Service: Gynecology;  Laterality: Bilateral;  . MOLE REMOVAL Right 07/26/2013   Procedure: MOLE REMOVAL UNDER RIGHT BREAST;  Surgeon: Shann Medal, MD;  Location: [facility];  Service: General;  Laterality: Right;  . PORTACATH PLACEMENT Right 07/26/2013   Procedure: INSERTION PORT-A-CATH;  Surgeon: Shann Medal, MD;  Location: [facility];  Service: General;  Laterality: Right;  . TONSILLECTOMY  1972  . WISDOM TOOTH EXTRACTION      FAMILY HISTORY Family History  Problem Relation Age of Onset  . Stroke Mother   . Diabetes Father   . Diabetes type I Father   . Colon cancer Maternal Grandmother 69  . Healthy Sister   . Healthy Brother   . Pulmonary  embolism Maternal Uncle   . Colon cancer Paternal Uncle 75  . Stomach cancer Maternal Grandfather 94       pipe smoker  . Diabetes type I Paternal Grandmother   . Lung cancer Paternal Grandfather   . Heart Problems Paternal Uncle    the patient's father died at the age of 33 in a car accident. The patient's mother died at the age of 41 from a stroke. The patient has one adopted brother. She has one biological sister. The patient's mother's mother was diagnosed with colon cancer at the age of 27. There is no history of breast oropharynx cancer in the family to the patient's knowledge.  She asked about her daughter needing testing for brca and i said no.  GYNECOLOGIC HISTORY:   (Updated 07/21/2017) Menarche age 89. First live birth age 13. The patient is GX P1. She took birth control pills for approximately 12 years remotely. She had a period in February of 2014 and then in December of 2014. She had been started on hormone replacement in December. That has since been discontinued. The patient is now status post total hysterectomy with BSO since May 2018.  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 Her husband died at [facility] with soft tissue sarcoma. The patient's significant other Lori Vincent also works as a Geophysicist/field seismologist. At home it is just the patient, her daughter Lori Vincent, Ander Gaster, and Richardson Landry. The patient has 2 stepchildren from her earlier marriage. [name] is a Games developer in [place] and [place] (masc.) studies criminal Justice.    ADVANCED DIRECTIVES: she has named Lori Vincent as her healthcare power of attorney. He can be reached at [facility]:  (Updated 11/15/2013) Social History   Tobacco Use  . Smoking status: Never Smoker  . Smokeless tobacco: Never Used  Substance Use Topics  . Alcohol use: Yes    Comment: 5-7 glasses per week of  red wine  . Drug use: No     Colonoscopy: July 2014  PAP: November 2014  Bone density: November 2014  Lipid panel: Not on file  Allergies  Allergen Reactions  . Compazine [Prochlorperazine] Other (See Comments)    Extreme restlessness and mild extrapyramidal movement;  Patient tolerates Reglan with no problems    Current Outpatient Medications  Medication Sig Dispense Refill  . citalopram (CELEXA) 10 MG tablet Take 10 mg by mouth daily.    Marland Kitchen levothyroxine (SYNTHROID, LEVOTHROID) 100 MCG tablet Take 100 mcg by mouth 2 (two) times a week. Takes 100 mcg on Saturdays and Sundays only    . metFORMIN (GLUCOPHAGE) 1000 MG tablet Take 1,000 mg by mouth 2 (two) times daily with a meal.    . SYNTHROID 112 MCG tablet TAKE 1 TABLET BY MOUTH ONCE DAILY MONDAY - FRIDAY  1  . tamoxifen (NOLVADEX) 20 MG tablet take 1 tablet by mouth once daily 90 tablet 4   No current facility-administered medications for this visit.     OBJECTIVE: Middle-aged white woman who appears well  Vitals:   07/21/17 0924  BP: 126/74  Pulse: 82  Resp: 19  Temp: 98.1 F (36.7 C)  SpO2: 100%   Body mass index is 31.43 kg/m.    ECOG FS: 0 Filed Weights   07/21/17 0924  Weight: 200 lb 11.2 oz (91 kg)   Sclerae unicteric, EOMs intact Oropharynx clear and moist No cervical or supraclavicular adenopathy Lungs no rales or rhonchi Heart regular rate and rhythm Abd soft, nontender, positive bowel sounds MSK no focal spinal tenderness, no upper extremity lymphedema; the left subscapular area which is an area of concern for her shows no area of concern by inspection or palpation and in fact palpation on felt like a "good massage" to her suggesting that we are dealing with a musculoskeletal issue. Neuro: nonfocal, well oriented, appropriate affect Breasts: The right breast is unremarkable.  The left breast is undergone lumpectomy and radiation.  The cosmetic result is good.  There is no evidence of local recurrence.  The  left axilla is benign.    LAB RESULTS: CBC    Component Value Date/Time   WBC 4.8 07/21/2017 0904   WBC 5.0 05/15/2017 0930   RBC 4.18 07/21/2017 0904   HGB 11.6 (L) 05/15/2017 0930   HGB 12.4 07/09/2016 1443   HCT 35.9 07/21/2017 0904   HCT 37.7 07/09/2016 1443   PLT 191 07/21/2017 0904   PLT 172 07/09/2016 1443   MCV 85.8 07/21/2017 0904   MCV 88.7 07/09/2016 1443   MCH 27.6 07/21/2017 0904   MCHC 32.2 07/21/2017 0904   RDW 15.5 (H) 07/21/2017  0904   RDW 14.5 07/09/2016 1443   LYMPHSABS 1.6 07/21/2017 0904   LYMPHSABS 1.8 07/09/2016 1443   MONOABS 0.4 07/21/2017 0904   MONOABS 0.4 07/09/2016 1443   EOSABS 0.2 07/21/2017 0904   EOSABS 0.2 07/09/2016 1443   BASOSABS 0.0 07/21/2017 0904   BASOSABS 0.0 07/09/2016 1443    CMP     Component Value Date/Time   NA 139 05/15/2017 0930   NA 139 07/09/2016 1443   K 3.6 05/15/2017 0930   K 3.4 (L) 07/09/2016 1443   CL 103 05/15/2017 0930   CO2 27 05/15/2017 0930   CO2 26 07/09/2016 1443   GLUCOSE 115 (H) 05/15/2017 0930   GLUCOSE 203 (H) 07/09/2016 1443   BUN 10 05/15/2017 0930   BUN 13.1 07/09/2016 1443   CREATININE 0.73 05/15/2017 0930   CREATININE 0.9 07/09/2016 1443   CALCIUM 9.2 05/15/2017 0930   CALCIUM 9.2 07/09/2016 1443   PROT 6.3 (L) 05/15/2017 0930   PROT 6.7 07/09/2016 1443   ALBUMIN 3.8 05/15/2017 0930   ALBUMIN 3.7 07/09/2016 1443   AST 28 05/15/2017 0930   AST 23 07/09/2016 1443   ALT 24 05/15/2017 0930   ALT 27 07/09/2016 1443   ALKPHOS 79 05/15/2017 0930   ALKPHOS 85 07/09/2016 1443   BILITOT 0.5 05/15/2017 0930   BILITOT 0.45 07/09/2016 1443   GFRNONAA >60 05/15/2017 0930   GFRAA >60 05/15/2017 0930    STUDIES: Since her last visit, she underwent diagnostic bilateral mammography with CAD and tomography on 06/12/2017 at [facility] showing: breast density category C. There was no evidence of malignancy.   ASSESSMENT: 62 y.o. [place] woman, status post left breast biopsy 06/03/2013  for a clinical T2 N0, stage IIA invasive ductal carcinoma, grade 3, estrogen and progesterone receptor positive, with an MIB-1 of 44%, and HER-2 amplified.  (1) status post left lumpectomy and sentinel lymph node sampling 07/26/2013 for a pT2 pN0, stage IIA invasive ductal carcinoma, grade 3, with negative margins.  (2) treated in the adjuvant setting with docetaxel/carboplatin (AUC 5) given along with trastuzumab/pertuzumab x6 cycles, first dose on 09/02/2013, last dose 12/16/2013  (3) trastuzumab continued through through March 2016  (4) adjuvant radiation completed 03/02/2014  (5) tamoxifen started 04/26/2014  (a) status post hysterectomy with bilateral salpingo-oophorectomy 09/30/2016, with benign pathology  (6)  Depression/anxiety - on citalopram  (7) chemotherapy-induced anemia -- resolved  (8) vaginal dryness/dyspareunia: s/p MonaLisa procedure; on vaginal estrogens  (9)   Genetics testing 02/14/2015  Through thecustom cancer gene panel offered by [facility] found no deleterious mutations in ATM, BARD1, BRCA1, BRCA2, BRIP1, CDH1, CHEK2, EPCAM, MLH1, MRE11A, MSH2, MSH6, MUTYH, NBN, NF1, PALB2, PMS2, PTEN, RAD50, RAD51C, RAD51D, SMARCA4, STK11, and TP53.    PLAN: Agustina is now just about 4 years out from definitive surgery for her breast cancer with no evidence of disease recurrence.  This is very favorable.  She is tolerating tamoxifen well and the plan will be to continue that through December 2020, after which she may or may not choose to receive an additional 2 years of aromatase inhibitors  She is welcome to resume vaginal estrogens if she finds that helpful.  So long as she is on tamoxifen I do not see a problem.  Once she is off tamoxifen though I will have no data regarding safety.  I have encouraged her to increase her exercise program and [facility]  I reassured her that  the left subscapular discomfort she is experiencing is  going to be due to musculoskeletal issues but just in case I put her in for a chest x-ray today.  Otherwise she knows to call for any problems that may develop before her next visit here, which will be in 1 year.   Magrinat, Virgie Dad, MD  07/21/17 9:45 AM Medical Oncology and Hematology [facility] [address] [place], [place] [address] Tel. [phone]    Fax. [phone]  This document serves as a record of services personally performed by Lurline Del, MD. It was created on his behalf by Sheron Nightingale, a trained medical scribe. The creation of this record is based on the scribe's personal observations and the provider's statements to them.   I have reviewed the above documentation for accuracy and completeness, and I agree with the above.

## 2017-07-21 ENCOUNTER — Inpatient Hospital Stay: Payer: BLUE CROSS/BLUE SHIELD | Attending: Oncology

## 2017-07-21 ENCOUNTER — Telehealth: Payer: Self-pay | Admitting: Oncology

## 2017-07-21 ENCOUNTER — Ambulatory Visit (HOSPITAL_COMMUNITY)
Admission: RE | Admit: 2017-07-21 | Discharge: 2017-07-21 | Disposition: A | Discharge status: Home / Self Care | Payer: BLUE CROSS/BLUE SHIELD | Source: Ambulatory Visit | Attending: Oncology | Admitting: Oncology

## 2017-07-21 ENCOUNTER — Inpatient Hospital Stay: Payer: BLUE CROSS/BLUE SHIELD | Admitting: Oncology

## 2017-07-21 VITALS — BP 126/74 | HR 82 | Temp 98.1°F | Resp 19 | Ht 67.0 in | Wt 200.7 lb

## 2017-07-21 DIAGNOSIS — Z170 Estrogen receptor positive status [ER+]: Secondary | ICD-10-CM

## 2017-07-21 DIAGNOSIS — C50512 Malignant neoplasm of lower-outer quadrant of left female breast: Secondary | ICD-10-CM

## 2017-07-21 DIAGNOSIS — Z79899 Other long term (current) drug therapy: Secondary | ICD-10-CM | POA: Insufficient documentation

## 2017-07-21 DIAGNOSIS — M25512 Pain in left shoulder: Secondary | ICD-10-CM | POA: Insufficient documentation

## 2017-07-21 DIAGNOSIS — Z90710 Acquired absence of both cervix and uterus: Secondary | ICD-10-CM | POA: Insufficient documentation

## 2017-07-21 DIAGNOSIS — Z923 Personal history of irradiation: Secondary | ICD-10-CM | POA: Insufficient documentation

## 2017-07-21 DIAGNOSIS — F329 Major depressive disorder, single episode, unspecified: Secondary | ICD-10-CM | POA: Diagnosis not present

## 2017-07-21 DIAGNOSIS — F419 Anxiety disorder, unspecified: Secondary | ICD-10-CM | POA: Insufficient documentation

## 2017-07-21 DIAGNOSIS — E039 Hypothyroidism, unspecified: Secondary | ICD-10-CM | POA: Insufficient documentation

## 2017-07-21 DIAGNOSIS — Z9221 Personal history of antineoplastic chemotherapy: Secondary | ICD-10-CM | POA: Insufficient documentation

## 2017-07-21 DIAGNOSIS — Z79810 Long term (current) use of selective estrogen receptor modulators (SERMs): Secondary | ICD-10-CM | POA: Insufficient documentation

## 2017-07-21 DIAGNOSIS — E1140 Type 2 diabetes mellitus with diabetic neuropathy, unspecified: Secondary | ICD-10-CM | POA: Insufficient documentation

## 2017-07-21 DIAGNOSIS — E876 Hypokalemia: Secondary | ICD-10-CM | POA: Diagnosis not present

## 2017-07-21 LAB — CBC WITH DIFFERENTIAL (CANCER CENTER ONLY)
BASOS ABS: 0 10*3/uL (ref 0.0–0.1)
Basophils Relative: 1 %
Eosinophils Absolute: 0.2 10*3/uL (ref 0.0–0.5)
Eosinophils Relative: 4 %
HCT: 35.9 % (ref 34.8–46.6)
HEMOGLOBIN: 11.5 g/dL — AB (ref 11.6–15.9)
LYMPHS PCT: 34 %
Lymphs Abs: 1.6 10*3/uL (ref 0.9–3.3)
MCH: 27.6 pg (ref 25.1–34.0)
MCHC: 32.2 g/dL (ref 31.5–36.0)
MCV: 85.8 fL (ref 79.5–101.0)
Monocytes Absolute: 0.4 10*3/uL (ref 0.1–0.9)
Monocytes Relative: 9 %
NEUTROS ABS: 2.5 10*3/uL (ref 1.5–6.5)
NEUTROS PCT: 52 %
PLATELETS: 191 10*3/uL (ref 145–400)
RBC: 4.18 MIL/uL (ref 3.70–5.45)
RDW: 15.5 % — ABNORMAL HIGH (ref 11.2–14.5)
WBC Count: 4.8 10*3/uL (ref 3.9–10.3)

## 2017-07-21 LAB — CMP (CANCER CENTER ONLY)
ALT: 22 U/L (ref 0–55)
AST: 20 U/L (ref 5–34)
Albumin: 3.7 g/dL (ref 3.5–5.0)
Alkaline Phosphatase: 91 U/L (ref 40–150)
Anion gap: 10 (ref 3–11)
BILIRUBIN TOTAL: 0.4 mg/dL (ref 0.2–1.2)
BUN: 14 mg/dL (ref 7–26)
CHLORIDE: 103 mmol/L (ref 98–109)
CO2: 26 mmol/L (ref 22–29)
CREATININE: 0.75 mg/dL (ref 0.60–1.10)
Calcium: 9.4 mg/dL (ref 8.4–10.4)
GFR, Est AFR Am: >60 mL/min (ref >60)
Glucose, Bld: 135 mg/dL (ref 70–140)
Potassium: 3.7 mmol/L (ref 3.5–5.1)
Sodium: 139 mmol/L (ref 136–145)
Total Protein: 6.5 g/dL (ref 6.4–8.3)

## 2017-07-21 NOTE — Telephone Encounter (Signed)
Gave avs and calendar for march 2020 °

## 2017-07-22 ENCOUNTER — Encounter: Payer: Self-pay | Admitting: Oncology

## 2017-07-22 DIAGNOSIS — H524 Presbyopia: Secondary | ICD-10-CM | POA: Diagnosis not present

## 2017-07-22 DIAGNOSIS — E119 Type 2 diabetes mellitus without complications: Secondary | ICD-10-CM | POA: Diagnosis not present

## 2017-09-10 DIAGNOSIS — R7301 Impaired fasting glucose: Secondary | ICD-10-CM | POA: Diagnosis not present

## 2017-09-10 DIAGNOSIS — E039 Hypothyroidism, unspecified: Secondary | ICD-10-CM | POA: Diagnosis not present

## 2017-09-22 DIAGNOSIS — E6609 Other obesity due to excess calories: Secondary | ICD-10-CM | POA: Diagnosis not present

## 2017-09-22 DIAGNOSIS — E039 Hypothyroidism, unspecified: Secondary | ICD-10-CM | POA: Diagnosis not present

## 2017-09-22 DIAGNOSIS — E1165 Type 2 diabetes mellitus with hyperglycemia: Secondary | ICD-10-CM | POA: Diagnosis not present

## 2017-10-30 DIAGNOSIS — E668 Other obesity: Secondary | ICD-10-CM | POA: Diagnosis not present

## 2017-10-30 DIAGNOSIS — H6692 Otitis media, unspecified, left ear: Secondary | ICD-10-CM | POA: Diagnosis not present

## 2017-10-30 DIAGNOSIS — E119 Type 2 diabetes mellitus without complications: Secondary | ICD-10-CM | POA: Diagnosis not present

## 2017-10-30 DIAGNOSIS — H7290 Unspecified perforation of tympanic membrane, unspecified ear: Secondary | ICD-10-CM | POA: Diagnosis not present

## 2018-01-24 ENCOUNTER — Other Ambulatory Visit: Payer: Self-pay | Admitting: Oncology

## 2018-03-17 ENCOUNTER — Other Ambulatory Visit: Payer: Self-pay | Admitting: Oncology

## 2018-03-17 DIAGNOSIS — Z853 Personal history of malignant neoplasm of breast: Secondary | ICD-10-CM

## 2018-05-01 ENCOUNTER — Other Ambulatory Visit: Payer: Self-pay | Admitting: *Deleted

## 2018-05-01 MED ORDER — TAMOXIFEN CITRATE 20 MG PO TABS: 20.0000 mg | ORAL_TABLET | Freq: Every day | ORAL | 0 refills | Status: DC

## 2018-05-11 DIAGNOSIS — E039 Hypothyroidism, unspecified: Secondary | ICD-10-CM | POA: Diagnosis not present

## 2018-06-04 DIAGNOSIS — Z01419 Encounter for gynecological examination (general) (routine) without abnormal findings: Secondary | ICD-10-CM | POA: Diagnosis not present

## 2018-06-04 DIAGNOSIS — Z6831 Body mass index (BMI) 31.0-31.9, adult: Secondary | ICD-10-CM | POA: Diagnosis not present

## 2018-06-04 DIAGNOSIS — Z13820 Encounter for screening for osteoporosis: Secondary | ICD-10-CM | POA: Diagnosis not present

## 2018-06-04 DIAGNOSIS — N958 Other specified menopausal and perimenopausal disorders: Secondary | ICD-10-CM | POA: Diagnosis not present

## 2018-06-04 DIAGNOSIS — R6882 Decreased libido: Secondary | ICD-10-CM | POA: Diagnosis not present

## 2018-06-16 ENCOUNTER — Ambulatory Visit
Admission: RE | Admit: 2018-06-16 | Discharge: 2018-06-16 | Disposition: A | Discharge status: Home / Self Care | Payer: BLUE CROSS/BLUE SHIELD | Source: Ambulatory Visit | Attending: Oncology | Admitting: Oncology

## 2018-06-16 DIAGNOSIS — Z853 Personal history of malignant neoplasm of breast: Secondary | ICD-10-CM | POA: Diagnosis not present

## 2018-06-16 DIAGNOSIS — R922 Inconclusive mammogram: Secondary | ICD-10-CM | POA: Diagnosis not present

## 2018-07-10 DIAGNOSIS — R6882 Decreased libido: Secondary | ICD-10-CM | POA: Diagnosis not present

## 2018-07-14 DIAGNOSIS — R0609 Other forms of dyspnea: Secondary | ICD-10-CM | POA: Diagnosis not present

## 2018-07-14 DIAGNOSIS — R002 Palpitations: Secondary | ICD-10-CM | POA: Diagnosis not present

## 2018-07-14 DIAGNOSIS — E119 Type 2 diabetes mellitus without complications: Secondary | ICD-10-CM | POA: Diagnosis not present

## 2018-07-14 DIAGNOSIS — R5383 Other fatigue: Secondary | ICD-10-CM | POA: Diagnosis not present

## 2018-07-15 ENCOUNTER — Other Ambulatory Visit (HOSPITAL_COMMUNITY): Payer: Self-pay | Admitting: Internal Medicine

## 2018-07-15 ENCOUNTER — Other Ambulatory Visit: Payer: Self-pay | Admitting: Internal Medicine

## 2018-07-15 DIAGNOSIS — R0609 Other forms of dyspnea: Secondary | ICD-10-CM

## 2018-07-15 DIAGNOSIS — R0600 Dyspnea, unspecified: Secondary | ICD-10-CM

## 2018-07-16 ENCOUNTER — Ambulatory Visit (HOSPITAL_COMMUNITY): Payer: BLUE CROSS/BLUE SHIELD | Attending: Cardiovascular Disease

## 2018-07-16 DIAGNOSIS — R0600 Dyspnea, unspecified: Secondary | ICD-10-CM

## 2018-07-16 DIAGNOSIS — R0609 Other forms of dyspnea: Secondary | ICD-10-CM

## 2018-07-17 ENCOUNTER — Other Ambulatory Visit: Payer: Self-pay | Admitting: Internal Medicine

## 2018-07-17 DIAGNOSIS — R0600 Dyspnea, unspecified: Secondary | ICD-10-CM

## 2018-07-17 DIAGNOSIS — R0609 Other forms of dyspnea: Principal | ICD-10-CM

## 2018-07-24 ENCOUNTER — Other Ambulatory Visit: Payer: Self-pay | Admitting: Internal Medicine

## 2018-07-24 ENCOUNTER — Other Ambulatory Visit: Payer: Self-pay | Admitting: *Deleted

## 2018-07-24 ENCOUNTER — Ambulatory Visit (HOSPITAL_COMMUNITY)
Admission: RE | Admit: 2018-07-24 | Discharge: 2018-07-24 | Disposition: A | Discharge status: Home / Self Care | Payer: BLUE CROSS/BLUE SHIELD | Source: Ambulatory Visit | Attending: Internal Medicine | Admitting: Internal Medicine

## 2018-07-24 ENCOUNTER — Ambulatory Visit
Admission: RE | Admit: 2018-07-24 | Discharge: 2018-07-24 | Disposition: A | Discharge status: Home / Self Care | Payer: BLUE CROSS/BLUE SHIELD | Source: Ambulatory Visit | Attending: Internal Medicine | Admitting: Internal Medicine

## 2018-07-24 DIAGNOSIS — R0609 Other forms of dyspnea: Secondary | ICD-10-CM | POA: Diagnosis not present

## 2018-07-24 DIAGNOSIS — R000 Tachycardia, unspecified: Secondary | ICD-10-CM

## 2018-07-24 DIAGNOSIS — R0600 Dyspnea, unspecified: Secondary | ICD-10-CM

## 2018-07-24 DIAGNOSIS — Z170 Estrogen receptor positive status [ER+]: Principal | ICD-10-CM

## 2018-07-24 DIAGNOSIS — C50512 Malignant neoplasm of lower-outer quadrant of left female breast: Secondary | ICD-10-CM

## 2018-07-24 MED ORDER — SODIUM CHLORIDE (PF) 0.9 % IJ SOLN
INTRAMUSCULAR | Status: AC
  Filled 2018-07-24: qty 50

## 2018-07-24 MED ORDER — IOHEXOL 350 MG/ML SOLN
100.0000 mL | Freq: Once | INTRAVENOUS | Status: AC | PRN
  Administered 2018-07-24: 100 mL via INTRAVENOUS

## 2018-07-26 NOTE — Progress Notes (Signed)
[place]  [phone] [phone]    ID: Lori Vincent DOB: [birth year]  MR#: [id number]  LGX#:[id number]  Patient Care Team: Marton Redwood, MD as PCP - General (Internal Medicine) Arloa Koh, MD as Consulting Physician (Radiation Oncology) Arionna Hoggard, Virgie Dad, MD as Consulting Physician (Oncology) [name], MD as Consulting Physician (General Surgery) Richmond Campbell, MD as Consulting Physician (Gastroenterology) Dian Queen, MD as Consulting Physician (Obstetrics and Gynecology) OTHER MD:    CHIEF COMPLAINT: estrogen receptor positive breast cancer  CURRENT TREATMENT: tamoxifen    BREAST CANCER HISTORY: From the original intake note:  Lori Vincent [facility] showing a possible mass in the left breast. Diagnostic left mammography and ultrasonography at the breast center 04/26/2013 confirmed an oval mass with microcalcifications in the 6:00 position of the left breast. There was an adjacent partially obscured 1.2 cm mass. On physical exam there was no palpable mass in the left breast. By ultrasonography, there was an oval mass at the 6:00 position measuring 1.4 cm, with a second mass measuring 1.1 cm. There were no abnormal lymph nodes noted in the left axilla.  Biopsy of the 6:00 mass in the left breast January 2015 showed ([id number]) and invasive ductal carcinoma, grade 3, estrogen receptor 97% positive, progesterone receptor 75% positive, with strong staining intensity, and MIB-144%. HER-2 was amplified, with the signals ratio 4.18 and in number per cell of 9.40 .  On 06/08/2013 the patient underwent bilateral breast MRI which showed a dumbbell-shaped area in the central left breast consisting of a 2 cm mass and 2 extensions or an adjacent masses, measuring a total of 2.9 cm. There were no abnormal appearing lymph nodes for any other areas of concern.  The patient's subsequent  history is as detailed below.   INTERVAL HISTORY: Lori Vincent returns today for follow-up and treatment of her estrogen receptor positive breast cancer.   She continues on tamoxifen. She has hot flashes every night. She does not have vaginal dryness or discharge.  Since her last visit here, she underwent bilateral digital diagnostic mammography on 06/16/2018 showing Breast Density Category C. There is no evidence of malignancy.   Last December Lori Vincent noticed her heart rate was up.  She brought this to Dr. Rueben Bash attention and patient tells me Dr. Michiel Sites checked her thyroid function and it was normal.  Lori Vincent has been feeling short of breath with activity as well.  She has been evaluated by Dr. Brigitte Pulse for this. She underwent an echocardiogram on 07/16/2018 showing an ejection fraction in the 60% - 65% range.   She also underwent a CT angiography chest with contrast on 07/24/2018 for tachycardia and dyspnea on exertion showing No pulmonary embolus. No thoracic aortic aneurysm or dissection.  She is mildly anemic and has been found to be iron deficient.  She is currently on oral iron supplementation with fair tolerance.   REVIEW OF SYSTEMS: Lori Vincent says she is feeling tired and weak, with tachycardia on mild exertion. She has some mild diarrhea, and she's unsure if it's from the metformin or from her gallbladder removal.  She has had no melena, bright red blood per rectum, or other evidence of bleeding.  The patient denies unusual headaches, visual changes, nausea, vomiting, or dizziness. There has been no unusual cough, phlegm production, or pleurisy. This been no change in bladder habits. The patient denies unexplained fatigue or unexplained weight loss, bleeding, rash, or fever. A detailed review of systems was otherwise noncontributory.  PAST MEDICAL HISTORY: Past Medical History:  Diagnosis Date  . Anemia due to chemotherapy   . Anxiety   . Arthritis    knees and feet  . Breast cancer ([place]) 06/03/13    left, 6:00 o'clock  . Diabetes mellitus without complication ([place])   . Family history of colon cancer   . History of blood transfusion 12/2013   [facility] - 2 units transfused - during chemo  . History of radiation therapy 01/18/14- 03/02/14   left breast 4680 cGy 26 sessions, boost of 1000 cGy 5 sessions  . Hypokalemia    during with chemo 08/2013  . Hypothyroidism   . Neuropathy    bottom of feet, mild  . Personal history of chemotherapy   . Personal history of radiation therapy     PAST SURGICAL HISTORY: Past Surgical History:  Procedure Laterality Date  . BREAST BIOPSY  1/14   lt  . BREAST LUMPECTOMY Left    2015  . BREAST LUMPECTOMY WITH NEEDLE LOCALIZATION AND AXILLARY SENTINEL LYMPH NODE BX Left 07/26/2013   Procedure: BREAST LUMPECTOMY WITH NEEDLE LOCALIZATION AND AXILLARY SENTINEL LYMPH NODE BX;  Surgeon: Shann Medal, MD;  Location: [facility];  Service: General;  Laterality: Left;  . CESAREAN SECTION  04/1999  . CHOLECYSTECTOMY N/A 05/16/2017   Procedure: LAPAROSCOPIC CHOLECYSTECTOMY WITH INTRAOPERATIVE CHOLANGIOGRAM;  Surgeon: [name], MD;  Location: [facility];  Service: General;  Laterality: N/A;  . COLONOSCOPY  2013  . DIAGNOSTIC LAPAROSCOPY     laser endometriosis  . DILATION AND CURETTAGE OF UTERUS    . DILITATION & CURRETTAGE/HYSTROSCOPY WITH HYDROTHERMAL ABLATION N/A 09/02/2014   Procedure: DILATATION & CURETTAGE/HYSTEROSCOPY WITH HYDROTHERMAL ABLATION;  Surgeon: Dian Queen, MD;  Location: [facility];  Service: Gynecology;  Laterality: N/A;  . LAPAROSCOPIC VAGINAL HYSTERECTOMY WITH SALPINGO OOPHORECTOMY Bilateral 09/30/2016   Procedure: LAPAROSCOPIC ASSISTED VAGINAL HYSTERECTOMY WITH BILATERAL SALPINGO OOPHORECTOMY;  Surgeon: Dian Queen, MD;  Location: [facility];  Service: Gynecology;  Laterality: Bilateral;  . MOLE REMOVAL Right 07/26/2013   Procedure: MOLE REMOVAL UNDER RIGHT BREAST;  Surgeon: Shann Medal, MD;  Location: [facility];   Service: General;  Laterality: Right;  . PORTACATH PLACEMENT Right 07/26/2013   Procedure: INSERTION PORT-A-CATH;  Surgeon: Shann Medal, MD;  Location: [facility];  Service: General;  Laterality: Right;  . TONSILLECTOMY  1972  . WISDOM TOOTH EXTRACTION      FAMILY HISTORY: Family History  Problem Relation Age of Onset  . Stroke Mother   . Diabetes Father   . Diabetes type I Father   . Colon cancer Maternal Grandmother 39  . Healthy Sister   . Healthy Brother   . Pulmonary embolism Maternal Uncle   . Colon cancer Paternal Uncle 50  . Stomach cancer Maternal Grandfather 94       pipe smoker  . Diabetes type I Paternal Grandmother   . Lung cancer Paternal Grandfather   . Heart Problems Paternal Uncle    The patient's father died at the age of 8 in a car accident. The patient's mother died at the age of 7 from a stroke. The patient has one adopted brother. She has one biological sister. The patient's mother's mother was diagnosed with colon cancer at the age of 54. There is no history of breast oropharynx cancer in the family to the patient's knowledge.  She asked about her daughter needing testing for BRCA and I said no.   GYNECOLOGIC HISTORY:   (  Updated 07/21/2017) Menarche age 83. First live birth age 30. The patient is GX P1. She took birth control pills for approximately 12 years remotely. She had a period in February of 2014 and then in December of 2014. She had been started on hormone replacement in December. That has since been discontinued. The patient is now status post total hysterectomy with BSO since May 2018.   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 Her husband died at [facility] with soft tissue sarcoma. The patient's significant other [name] also works as a Geophysicist/field seismologist. At home it is just the patient, her daughter  Radene Gunning, Ander Gaster, and Richardson Landry. The patient has 2 stepchildren from her earlier marriage. [name] is a Games developer in [place] and [place] (masc.) studies criminal Justice.   ADVANCED DIRECTIVES: she has named [name] as her healthcare power of attorney. He can be reached at [place]:  (Updated 11/15/2013) Social History   Tobacco Use  . Smoking status: Never Smoker  . Smokeless tobacco: Never Used  Substance Use Topics  . Alcohol use: Yes    Comment: 5-7 glasses per week of red wine  . Drug use: No     Colonoscopy: July 2014  PAP: November 2014  Bone density: November 2014  Lipid panel: Not on file  Allergies  Allergen Reactions  . Compazine [Prochlorperazine] Other (See Comments)    Extreme restlessness and mild extrapyramidal movement;  Patient tolerates Reglan with no problems    Current Outpatient Medications  Medication Sig Dispense Refill  . citalopram (CELEXA) 10 MG tablet Take 10 mg by mouth daily.    Marland Kitchen levothyroxine (SYNTHROID, LEVOTHROID) 100 MCG tablet Take 100 mcg by mouth 2 (two) times a week. Takes 100 mcg on Saturdays and Sundays only    . metFORMIN (GLUCOPHAGE) 1000 MG tablet Take 1,000 mg by mouth 2 (two) times daily with a meal.    . SYNTHROID 112 MCG tablet TAKE 1 TABLET BY MOUTH ONCE DAILY MONDAY - FRIDAY  1  . tamoxifen (NOLVADEX) 20 MG tablet Take 1 tablet (20 mg total) by mouth daily. 90 tablet 0   No current facility-administered medications for this visit.     OBJECTIVE: Middle-aged white woman appears stated age  67:   07/27/18 0855  BP: 132/74  Pulse: 100  Resp: 18  Temp: 98.5 F (36.9 C)  SpO2: 98%   Body mass index is 30.24 kg/m.    ECOG FS: 0 Filed Weights   07/27/18 0855  Weight: 193 lb 1.6 oz (87.6 kg)   Sclerae unicteric, pupils round and equal No cervical or supraclavicular adenopathy Lungs no rales or rhonchi Heart regular rate and rhythm Abd soft, nontender, positive bowel sounds MSK no focal spinal  tenderness, no upper extremity lymphedema Neuro: nonfocal, well oriented, appropriate affect Breasts: The right breast is benign.  The left breast is status post lumpectomy and radiation.  There is no evidence of disease recurrence.  Both axillae are benign.  LAB RESULTS: CBC    Component Value Date/Time   WBC 4.5 07/27/2018 0842   WBC 5.0 05/15/2017 0930   RBC 4.16 07/27/2018 0842   HGB 11.0 (L) 07/27/2018 0842   HGB 12.4 07/09/2016 1443   HCT 35.6 (L) 07/27/2018 0842   HCT 37.7 07/09/2016 1443   PLT 215 07/27/2018 0842   PLT 172 07/09/2016 1443  MCV 85.6 07/27/2018 0842   MCV 88.7 07/09/2016 1443   MCH 26.4 07/27/2018 0842   MCHC 30.9 07/27/2018 0842   RDW 16.6 (H) 07/27/2018 0842   RDW 14.5 07/09/2016 1443   LYMPHSABS 1.7 07/27/2018 0842   LYMPHSABS 1.8 07/09/2016 1443   MONOABS 0.5 07/27/2018 0842   MONOABS 0.4 07/09/2016 1443   EOSABS 0.2 07/27/2018 0842   EOSABS 0.2 07/09/2016 1443   BASOSABS 0.0 07/27/2018 0842   BASOSABS 0.0 07/09/2016 1443    CMP     Component Value Date/Time   NA 139 07/21/2017 0904   NA 139 07/09/2016 1443   K 3.7 07/21/2017 0904   K 3.4 (L) 07/09/2016 1443   CL 103 07/21/2017 0904   CO2 26 07/21/2017 0904   CO2 26 07/09/2016 1443   GLUCOSE 135 07/21/2017 0904   GLUCOSE 203 (H) 07/09/2016 1443   BUN 14 07/21/2017 0904   BUN 13.1 07/09/2016 1443   CREATININE 0.75 07/21/2017 0904   CREATININE 0.9 07/09/2016 1443   CALCIUM 9.4 07/21/2017 0904   CALCIUM 9.2 07/09/2016 1443   PROT 6.5 07/21/2017 0904   PROT 6.7 07/09/2016 1443   ALBUMIN 3.7 07/21/2017 0904   ALBUMIN 3.7 07/09/2016 1443   AST 20 07/21/2017 0904   AST 23 07/09/2016 1443   ALT 22 07/21/2017 0904   ALT 27 07/09/2016 1443   ALKPHOS 91 07/21/2017 0904   ALKPHOS 85 07/09/2016 1443   BILITOT 0.4 07/21/2017 0904   BILITOT 0.45 07/09/2016 1443   GFRNONAA >60 07/21/2017 0904   GFRAA >60 07/21/2017 0904    STUDIES: Ct Angio Chest Pe W Or Wo Contrast  Result Date:  07/24/2018 CLINICAL DATA:  Tachycardia and dyspnea on exertion EXAM: CT ANGIOGRAPHY CHEST WITH CONTRAST TECHNIQUE: Multidetector CT imaging of the chest was performed using the standard protocol during bolus administration of intravenous contrast. Multiplanar CT image reconstructions and MIPs were obtained to evaluate the vascular anatomy. CONTRAST:  111m OMNIPAQUE IOHEXOL 350 MG/ML SOLN COMPARISON:  None. FINDINGS: Cardiovascular: Satisfactory opacification of the pulmonary arteries to the segmental level. No evidence of pulmonary embolism. Normal heart size. No pericardial effusion. Normal thoracic aorta without aneurysm or dissection. Mediastinum/Nodes: No enlarged mediastinal, hilar, or axillary lymph nodes. Thyroid gland, trachea, and esophagus demonstrate no significant findings. Lungs/Pleura: Mild lingular scarring versus atelectasis. No focal consolidation, pleural effusion or pneumothorax. Upper Abdomen: No acute abnormality. Musculoskeletal: No chest wall abnormality. No acute or significant osseous findings. Review of the MIP images confirms the above findings. IMPRESSION: 1. No pulmonary embolus. 2. No thoracic aortic aneurysm or dissection. Electronically Signed   By: HKathreen Devoid  On: 07/24/2018 18:42     ASSESSMENT: 63y.o. [place] woman, status post left breast biopsy 06/03/2013 for a clinical T2 N0, stage IIA invasive ductal carcinoma, grade 3, estrogen and progesterone receptor positive, with an MIB-1 of 44%, and HER-2 amplified.  (1) status post left lumpectomy and sentinel lymph node sampling 07/26/2013 for a pT2 pN0, stage IIA invasive ductal carcinoma, grade 3, with negative margins.  (2) treated in the adjuvant setting with docetaxel/carboplatin (AUC 5) given along with trastuzumab/pertuzumab x6 cycles, first dose on 09/02/2013, last dose 12/16/2013  (3) trastuzumab continued through through March 2016  (4) adjuvant radiation completed 03/02/2014  (5) tamoxifen started  04/26/2014  (a) status post hysterectomy with bilateral salpingo-oophorectomy 09/30/2016, with benign pathology  (6)  Depression/anxiety - on citalopram  (7) chemotherapy-induced anemia -- resolved  (8) vaginal dryness/dyspareunia: s/p MonaLisa procedure; on vaginal estrogens  (  9)   Genetics testing 02/14/2015  Through thecustom cancer gene panel offered by [facility] found no deleterious mutations in ATM, BARD1, BRCA1, BRCA2, BRIP1, CDH1, CHEK2, EPCAM, MLH1, MRE11A, MSH2, MSH6, MUTYH, NBN, NF1, PALB2, PMS2, PTEN, RAD50, RAD51C, RAD51D, SMARCA4, STK11, and TP53.    PLAN: Jacquelyn is now just about 5 years out from definitive surgery for her breast cancer with no evidence of disease recurrence.  This is very favorable.  She is tolerating tamoxifen generally well.  The plan will be to continue that through December of this year and then reassess  She is having some nighttime hot flashes which I think would improve with gabapentin.  We discussed that today and I have placed that prescription in for her.  We discussed Feraheme and that certainly will correct her iron deficiency more quickly.  I am not sure this is going to make much difference to her heart rate however.  I find that patients with a hemoglobin of 10 generally have pretty much a normal heart rate and no symptoms.  She is being thoroughly worked up by Dr. Manuella Ghazi who I believe is planning to repeat her thyroid studies.  That seems to me the most likely culprit but again I do not have Dr. balance results from last December.  Otherwise I am delighted that Lori Vincent is doing so well.  She will see me again in a year.  At that time we will decide whether she can graduate from follow-up or whether she wishes to continue antiestrogens an additional 2 years.   Rjay Revolorio, Virgie Dad, MD  07/27/18 9:26 AM Medical Oncology and Hematology [facility] [address] [place], [place] [address] Tel. [phone]    Fax.  [phone]  I, Jacqualyn Posey am acting as a Education administrator for Chauncey Cruel, MD.   I, Lurline Del MD, have reviewed the above documentation for accuracy and completeness, and I agree with the above.

## 2018-07-27 ENCOUNTER — Telehealth: Payer: Self-pay | Admitting: Oncology

## 2018-07-27 ENCOUNTER — Inpatient Hospital Stay: Payer: BLUE CROSS/BLUE SHIELD

## 2018-07-27 ENCOUNTER — Inpatient Hospital Stay: Payer: BLUE CROSS/BLUE SHIELD | Attending: Oncology | Admitting: Oncology

## 2018-07-27 VITALS — BP 132/74 | HR 100 | Temp 98.5°F | Resp 18 | Ht 67.0 in | Wt 193.1 lb

## 2018-07-27 DIAGNOSIS — C50512 Malignant neoplasm of lower-outer quadrant of left female breast: Secondary | ICD-10-CM | POA: Diagnosis not present

## 2018-07-27 DIAGNOSIS — H5211 Myopia, right eye: Secondary | ICD-10-CM | POA: Diagnosis not present

## 2018-07-27 DIAGNOSIS — Z170 Estrogen receptor positive status [ER+]: Principal | ICD-10-CM

## 2018-07-27 DIAGNOSIS — E119 Type 2 diabetes mellitus without complications: Secondary | ICD-10-CM | POA: Insufficient documentation

## 2018-07-27 DIAGNOSIS — Z7984 Long term (current) use of oral hypoglycemic drugs: Secondary | ICD-10-CM

## 2018-07-27 DIAGNOSIS — C50912 Malignant neoplasm of unspecified site of left female breast: Secondary | ICD-10-CM | POA: Insufficient documentation

## 2018-07-27 DIAGNOSIS — Z9221 Personal history of antineoplastic chemotherapy: Secondary | ICD-10-CM | POA: Diagnosis not present

## 2018-07-27 DIAGNOSIS — E039 Hypothyroidism, unspecified: Secondary | ICD-10-CM | POA: Insufficient documentation

## 2018-07-27 DIAGNOSIS — F329 Major depressive disorder, single episode, unspecified: Secondary | ICD-10-CM

## 2018-07-27 DIAGNOSIS — Z800 Family history of malignant neoplasm of digestive organs: Secondary | ICD-10-CM | POA: Diagnosis not present

## 2018-07-27 DIAGNOSIS — F419 Anxiety disorder, unspecified: Secondary | ICD-10-CM | POA: Diagnosis not present

## 2018-07-27 DIAGNOSIS — Z923 Personal history of irradiation: Secondary | ICD-10-CM | POA: Diagnosis not present

## 2018-07-27 DIAGNOSIS — Z79810 Long term (current) use of selective estrogen receptor modulators (SERMs): Secondary | ICD-10-CM | POA: Insufficient documentation

## 2018-07-27 DIAGNOSIS — Z79899 Other long term (current) drug therapy: Secondary | ICD-10-CM | POA: Diagnosis not present

## 2018-07-27 DIAGNOSIS — D508 Other iron deficiency anemias: Secondary | ICD-10-CM

## 2018-07-27 DIAGNOSIS — H2513 Age-related nuclear cataract, bilateral: Secondary | ICD-10-CM | POA: Diagnosis not present

## 2018-07-27 DIAGNOSIS — D509 Iron deficiency anemia, unspecified: Secondary | ICD-10-CM | POA: Insufficient documentation

## 2018-07-27 LAB — CMP (CANCER CENTER ONLY)
ALBUMIN: 3.9 g/dL (ref 3.5–5.0)
ALT: 27 U/L (ref 0–44)
ANION GAP: 13 (ref 5–15)
AST: 25 U/L (ref 15–41)
Alkaline Phosphatase: 75 U/L (ref 38–126)
BILIRUBIN TOTAL: 0.5 mg/dL (ref 0.3–1.2)
BUN: 12 mg/dL (ref 6–20)
CHLORIDE: 103 mmol/L (ref 98–111)
CO2: 23 mmol/L (ref 22–32)
Calcium: 9 mg/dL (ref 8.9–10.3)
Creatinine: 0.8 mg/dL (ref 0.44–1.00)
GFR, Est AFR Am: >60 mL/min (ref >60)
Glucose, Bld: 182 mg/dL — ABNORMAL HIGH (ref 70–99)
POTASSIUM: 3.6 mmol/L (ref 3.5–5.1)
Sodium: 139 mmol/L (ref 135–145)
Total Protein: 7 g/dL (ref 6.5–8.1)

## 2018-07-27 LAB — CBC WITH DIFFERENTIAL (CANCER CENTER ONLY)
ABS IMMATURE GRANULOCYTES: 0.01 10*3/uL (ref 0.00–0.07)
BASOS ABS: 0 10*3/uL (ref 0.0–0.1)
Basophils Relative: 1 %
Eosinophils Absolute: 0.2 10*3/uL (ref 0.0–0.5)
Eosinophils Relative: 4 %
HCT: 35.6 % — ABNORMAL LOW (ref 36.0–46.0)
HEMOGLOBIN: 11 g/dL — AB (ref 12.0–15.0)
Immature Granulocytes: 0 %
LYMPHS PCT: 37 %
Lymphs Abs: 1.7 10*3/uL (ref 0.7–4.0)
MCH: 26.4 pg (ref 26.0–34.0)
MCHC: 30.9 g/dL (ref 30.0–36.0)
MCV: 85.6 fL (ref 80.0–100.0)
Monocytes Absolute: 0.5 10*3/uL (ref 0.1–1.0)
Monocytes Relative: 10 %
NEUTROS ABS: 2.2 10*3/uL (ref 1.7–7.7)
NRBC: 0 % (ref 0.0–0.2)
Neutrophils Relative %: 48 %
PLATELETS: 215 10*3/uL (ref 150–400)
RBC: 4.16 MIL/uL (ref 3.87–5.11)
RDW: 16.6 % — ABNORMAL HIGH (ref 11.5–15.5)
WBC: 4.5 10*3/uL (ref 4.0–10.5)

## 2018-07-27 MED ORDER — GABAPENTIN 300 MG PO CAPS: 300.0000 mg | ORAL_CAPSULE | Freq: Every day | ORAL | 4 refills | Status: DC

## 2018-07-27 MED ORDER — TAMOXIFEN CITRATE 20 MG PO TABS: 20.0000 mg | ORAL_TABLET | Freq: Every day | ORAL | 0 refills | Status: DC

## 2018-07-27 NOTE — Telephone Encounter (Signed)
Called regarding 3/3 and 3/12

## 2018-07-27 NOTE — Addendum Note (Signed)
Addended by: Chauncey Cruel on: 07/27/2018 09:34 AM   Modules accepted: Orders

## 2018-07-27 NOTE — Telephone Encounter (Signed)
Gave avs and calendar waiting for approval for 3/3

## 2018-07-28 ENCOUNTER — Inpatient Hospital Stay: Payer: BLUE CROSS/BLUE SHIELD

## 2018-07-28 VITALS — BP 126/72 | HR 76 | Temp 98.0°F | Resp 18

## 2018-07-28 DIAGNOSIS — Z923 Personal history of irradiation: Secondary | ICD-10-CM | POA: Diagnosis not present

## 2018-07-28 DIAGNOSIS — E119 Type 2 diabetes mellitus without complications: Secondary | ICD-10-CM | POA: Diagnosis not present

## 2018-07-28 DIAGNOSIS — F419 Anxiety disorder, unspecified: Secondary | ICD-10-CM | POA: Diagnosis not present

## 2018-07-28 DIAGNOSIS — C50912 Malignant neoplasm of unspecified site of left female breast: Secondary | ICD-10-CM | POA: Diagnosis not present

## 2018-07-28 DIAGNOSIS — Z800 Family history of malignant neoplasm of digestive organs: Secondary | ICD-10-CM | POA: Diagnosis not present

## 2018-07-28 DIAGNOSIS — D508 Other iron deficiency anemias: Secondary | ICD-10-CM

## 2018-07-28 DIAGNOSIS — E039 Hypothyroidism, unspecified: Secondary | ICD-10-CM | POA: Diagnosis not present

## 2018-07-28 DIAGNOSIS — Z79810 Long term (current) use of selective estrogen receptor modulators (SERMs): Secondary | ICD-10-CM | POA: Diagnosis not present

## 2018-07-28 DIAGNOSIS — Z79899 Other long term (current) drug therapy: Secondary | ICD-10-CM | POA: Diagnosis not present

## 2018-07-28 DIAGNOSIS — Z9221 Personal history of antineoplastic chemotherapy: Secondary | ICD-10-CM | POA: Diagnosis not present

## 2018-07-28 DIAGNOSIS — Z170 Estrogen receptor positive status [ER+]: Secondary | ICD-10-CM | POA: Diagnosis not present

## 2018-07-28 DIAGNOSIS — F329 Major depressive disorder, single episode, unspecified: Secondary | ICD-10-CM | POA: Diagnosis not present

## 2018-07-28 DIAGNOSIS — Z7984 Long term (current) use of oral hypoglycemic drugs: Secondary | ICD-10-CM | POA: Diagnosis not present

## 2018-07-28 MED ORDER — FERUMOXYTOL INJECTION 510 MG/17 ML
510.0000 mg | Freq: Once | INTRAVENOUS | Status: AC
  Administered 2018-07-28: 510 mg via INTRAVENOUS
  Filled 2018-07-28: qty 17

## 2018-07-28 MED ORDER — SODIUM CHLORIDE 0.9 % IV SOLN
INTRAVENOUS | Status: DC
  Administered 2018-07-28: 09:00:00 via INTRAVENOUS
  Filled 2018-07-28: qty 250

## 2018-07-28 NOTE — Patient Instructions (Signed)

## 2018-08-06 ENCOUNTER — Inpatient Hospital Stay: Payer: BLUE CROSS/BLUE SHIELD

## 2018-08-06 ENCOUNTER — Ambulatory Visit: Payer: BLUE CROSS/BLUE SHIELD

## 2018-08-06 VITALS — BP 119/63 | HR 82 | Temp 98.6°F | Resp 16

## 2018-08-06 DIAGNOSIS — F329 Major depressive disorder, single episode, unspecified: Secondary | ICD-10-CM | POA: Diagnosis not present

## 2018-08-06 DIAGNOSIS — Z79810 Long term (current) use of selective estrogen receptor modulators (SERMs): Secondary | ICD-10-CM | POA: Diagnosis not present

## 2018-08-06 DIAGNOSIS — Z170 Estrogen receptor positive status [ER+]: Secondary | ICD-10-CM | POA: Diagnosis not present

## 2018-08-06 DIAGNOSIS — E119 Type 2 diabetes mellitus without complications: Secondary | ICD-10-CM | POA: Diagnosis not present

## 2018-08-06 DIAGNOSIS — Z79899 Other long term (current) drug therapy: Secondary | ICD-10-CM | POA: Diagnosis not present

## 2018-08-06 DIAGNOSIS — Z9221 Personal history of antineoplastic chemotherapy: Secondary | ICD-10-CM | POA: Diagnosis not present

## 2018-08-06 DIAGNOSIS — Z800 Family history of malignant neoplasm of digestive organs: Secondary | ICD-10-CM | POA: Diagnosis not present

## 2018-08-06 DIAGNOSIS — Z923 Personal history of irradiation: Secondary | ICD-10-CM | POA: Diagnosis not present

## 2018-08-06 DIAGNOSIS — D508 Other iron deficiency anemias: Secondary | ICD-10-CM

## 2018-08-06 DIAGNOSIS — E039 Hypothyroidism, unspecified: Secondary | ICD-10-CM | POA: Diagnosis not present

## 2018-08-06 DIAGNOSIS — F419 Anxiety disorder, unspecified: Secondary | ICD-10-CM | POA: Diagnosis not present

## 2018-08-06 DIAGNOSIS — C50912 Malignant neoplasm of unspecified site of left female breast: Secondary | ICD-10-CM | POA: Diagnosis not present

## 2018-08-06 DIAGNOSIS — Z7984 Long term (current) use of oral hypoglycemic drugs: Secondary | ICD-10-CM | POA: Diagnosis not present

## 2018-08-06 MED ORDER — SODIUM CHLORIDE 0.9 % IV SOLN
INTRAVENOUS | Status: DC
  Administered 2018-08-06: 10:00:00 via INTRAVENOUS
  Filled 2018-08-06: qty 250

## 2018-08-06 MED ORDER — SODIUM CHLORIDE 0.9 % IV SOLN
510.0000 mg | Freq: Once | INTRAVENOUS | Status: AC
  Administered 2018-08-06: 510 mg via INTRAVENOUS
  Filled 2018-08-06: qty 17

## 2018-08-06 NOTE — Patient Instructions (Signed)

## 2018-08-10 DIAGNOSIS — R6882 Decreased libido: Secondary | ICD-10-CM | POA: Diagnosis not present

## 2018-08-13 ENCOUNTER — Ambulatory Visit: Payer: BLUE CROSS/BLUE SHIELD

## 2018-08-21 ENCOUNTER — Telehealth: Payer: Self-pay

## 2018-08-21 NOTE — Telephone Encounter (Signed)
   Attempted to reschedule patient for a virtual visit. Pt declined and state she would prefer to reschedule as she doesn't feel the appointment is urgent.   Cardiac Questionnaire:    Since your last visit or hospitalization:    1. Have you been having new or worsening chest pain? No   2. Have you been having new or worsening shortness of breath? No 3. Have you been having new or worsening leg swelling, wt gain, or increase in abdominal girth (pants fitting more tightly)? No   4. Have you had any passing out spells? No           Primary Cardiologist:  Dr. Harrell Gave   Patient contacted.  History reviewed.  No symptoms to suggest any unstable cardiac conditions.  Based on discussion, with current pandemic situation, we will be postponing this appointment for [name] with a plan for f/u in 2 months or sooner if feasible/necessary.  If symptoms change, she has been instructed to contact our office.   Routing to C19 CANCEL pool for tracking (P CV DIV CV19 CANCEL - reason for visit "other.") and assigning priority (1 = 4-6 wks, 2 = 6-12 wks, 3 = >12 wks).   Meryl Crutch, RN  08/21/2018 4:09 PM         .

## 2018-08-25 ENCOUNTER — Ambulatory Visit: Payer: BLUE CROSS/BLUE SHIELD | Admitting: Cardiology

## 2018-09-18 DIAGNOSIS — R7989 Other specified abnormal findings of blood chemistry: Secondary | ICD-10-CM | POA: Diagnosis not present

## 2018-10-02 ENCOUNTER — Telehealth: Payer: Self-pay | Admitting: Cardiology

## 2018-10-02 NOTE — Telephone Encounter (Signed)
LMTCB.  PATIENT NEEDS TO R/S NEW PT APPOINTMENT TO JULY OR AUGUST PER HER PREFERENCE,  SEE REFERRAL WORKQUEUE/DC

## 2018-10-20 ENCOUNTER — Other Ambulatory Visit: Payer: Self-pay | Admitting: Oncology

## 2018-11-05 ENCOUNTER — Telehealth: Payer: Self-pay | Admitting: Cardiology

## 2018-11-05 NOTE — Telephone Encounter (Signed)
LMTCB - NEW PATIENT - pt had appt on 08/25/18 that was cancelled due to covid.  Left message for her to call.  Please reschedule her appt with Dr. Harrell Gave.

## 2018-11-23 ENCOUNTER — Telehealth: Payer: Self-pay | Admitting: Cardiology

## 2018-11-23 NOTE — Telephone Encounter (Signed)
lmtcb - pt needs to reschedule her appt that was cancelled in March.  Please schedule with Margaretann Loveless or Harrell Gave.

## 2018-11-30 NOTE — Telephone Encounter (Signed)
A message was left,re: rescheduling office visit from 08/25/18 due to COVID 19 protocol. This has been the fourth attempt.

## 2018-12-16 IMAGING — MG 2D DIGITAL DIAGNOSTIC BILATERAL MAMMOGRAM WITH CAD AND ADJUNCT T
8 of 13 series · 8 of 29 positions shown · non-contrast
Comparison: Previous exam(s).

CLINICAL DATA: 61-year-old female with history of left breast
cancer post lumpectomy 07/26/2013 followed by radiation therapy.

EXAM:
2D DIGITAL DIAGNOSTIC BILATERAL MAMMOGRAM WITH CAD AND ADJUNCT TOMO

[L CC (1 of 2)]
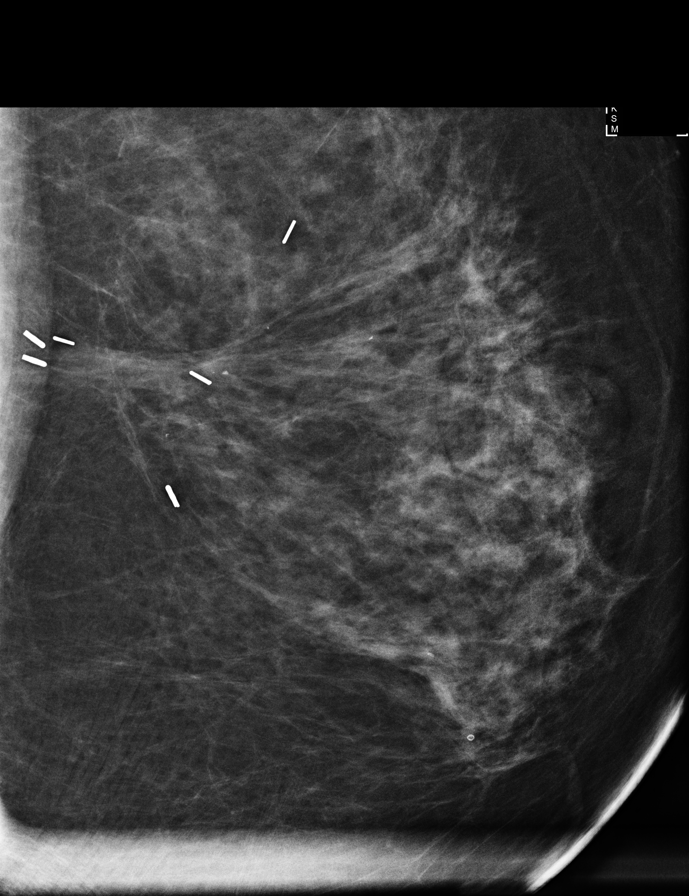

[R MLO synth-2D]
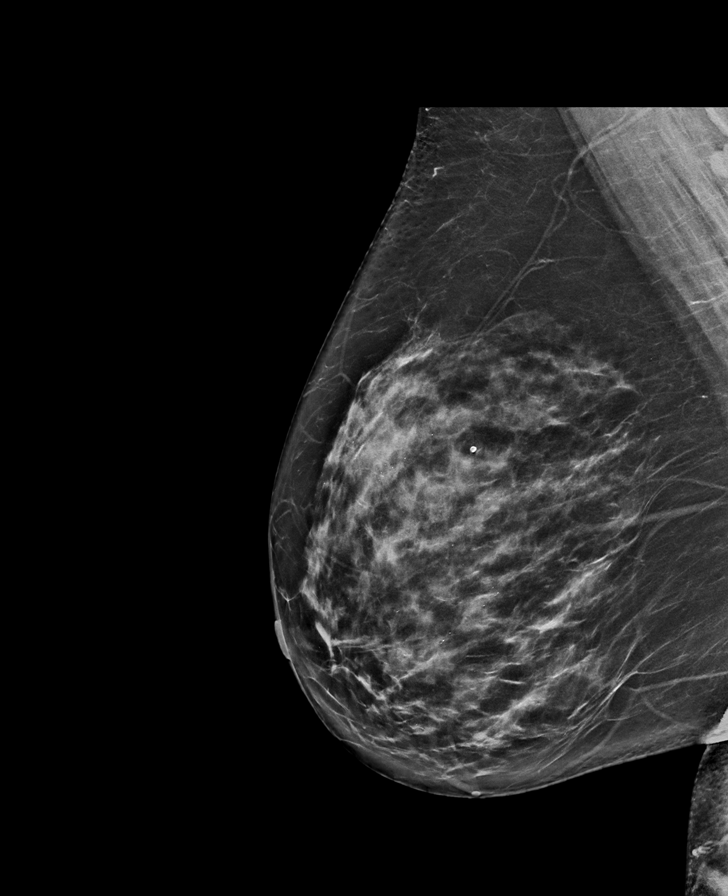

[L MLO]
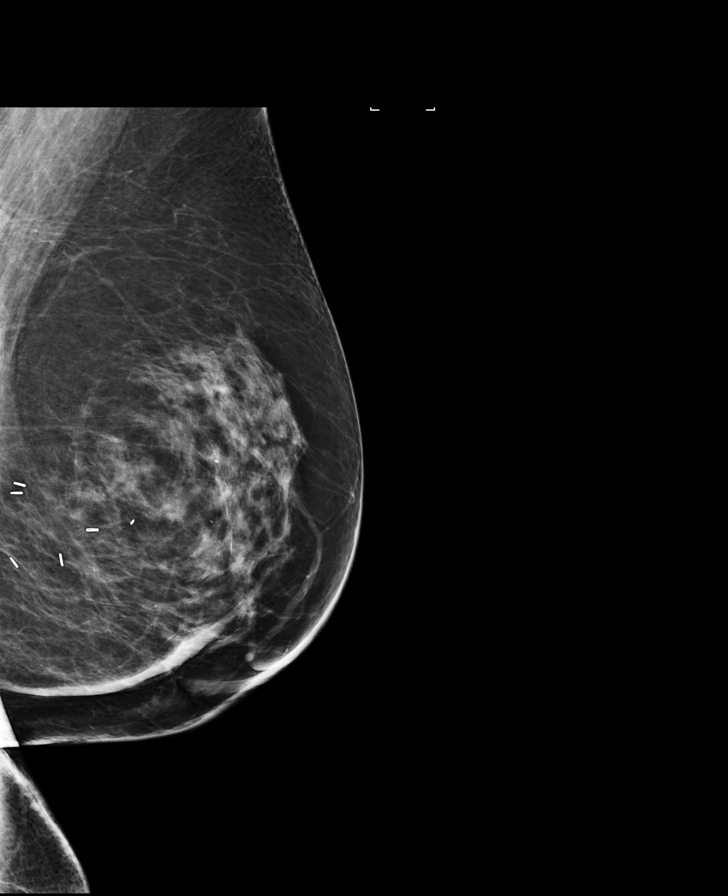

[L MLO synth-2D]
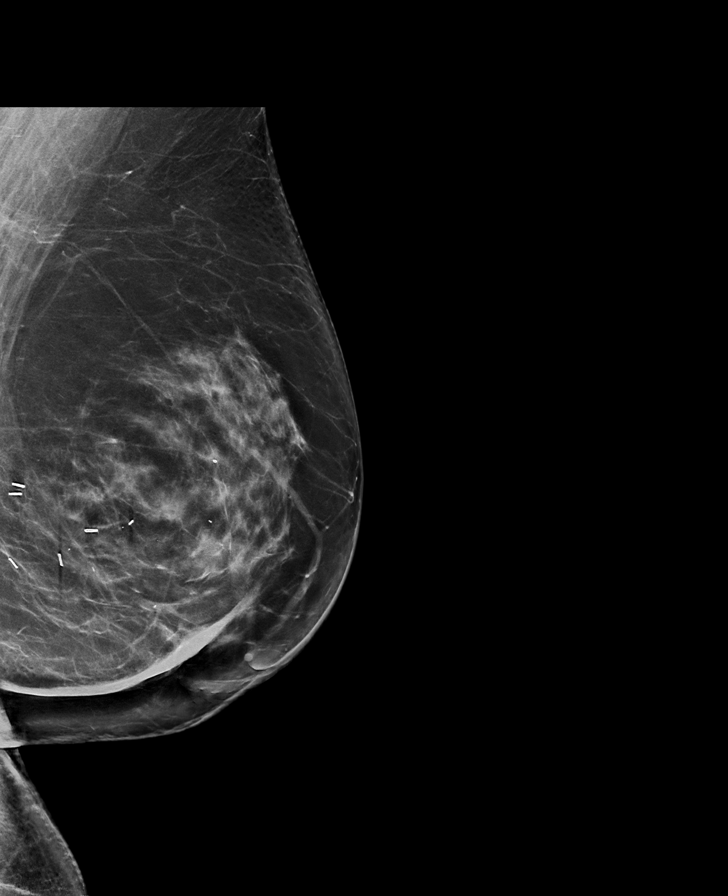

[L CC (2 of 2)]
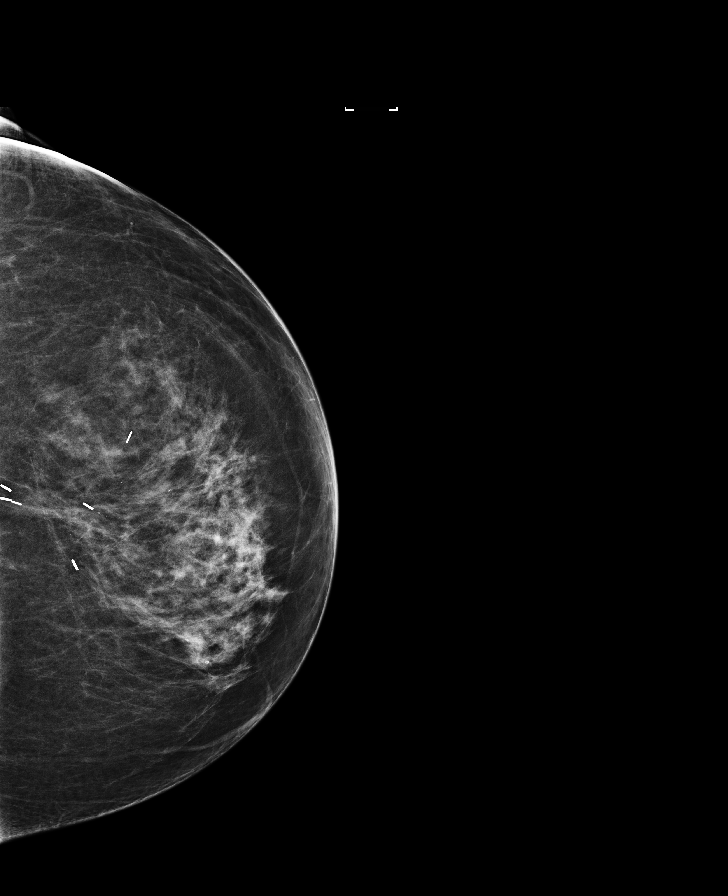

[R CC]
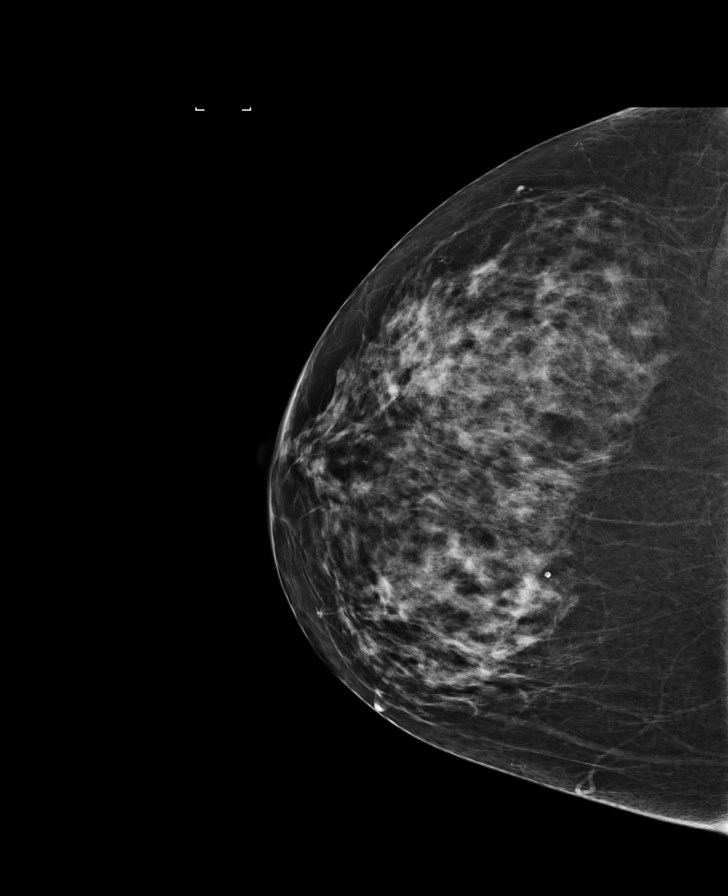

[R CC synth-2D]
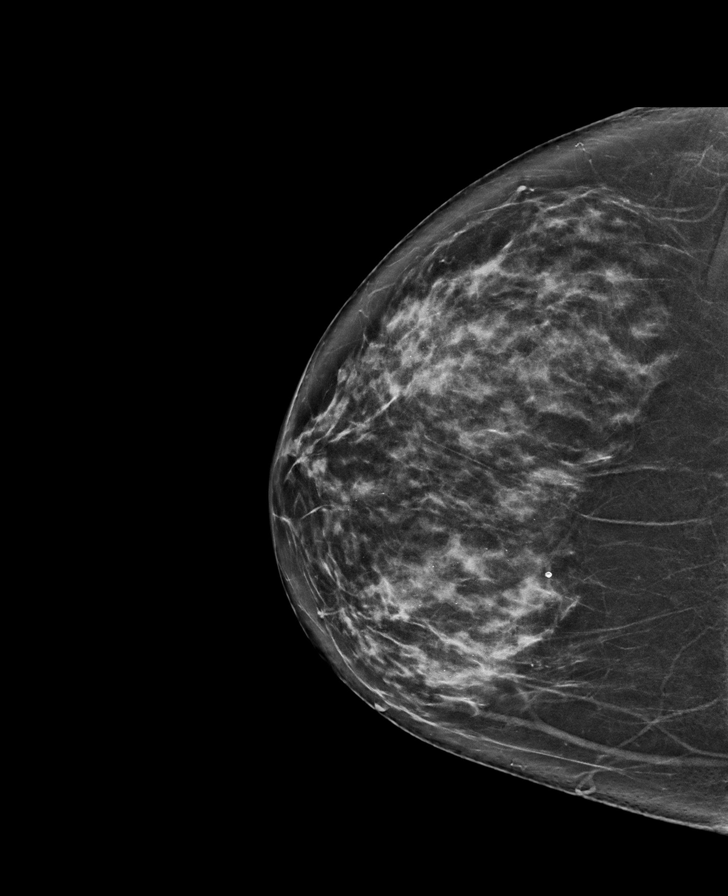

[R MLO]
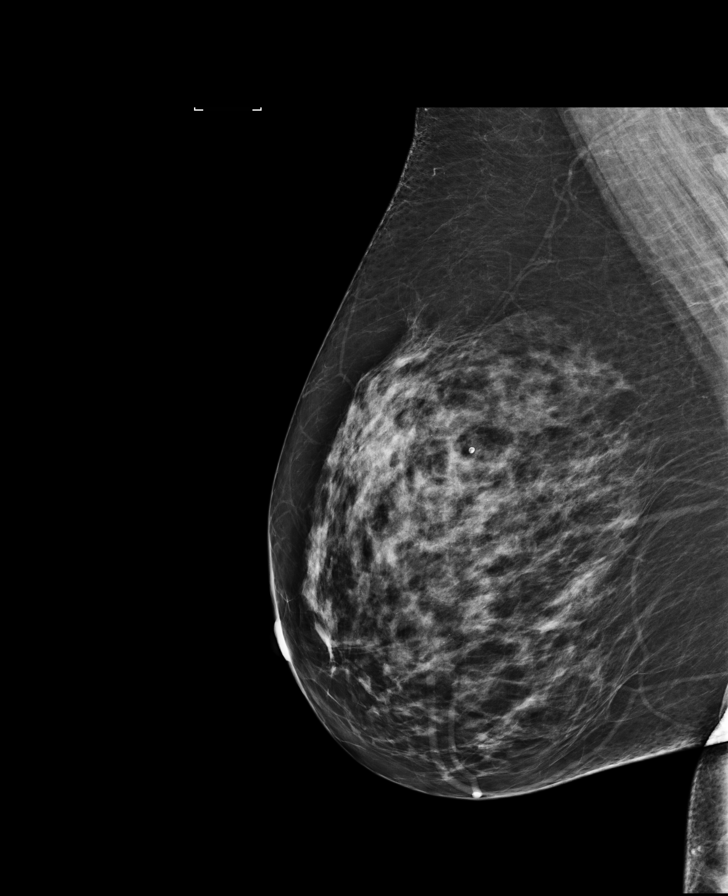

[8 of 29 positions shown; findings below may reference images not displayed]

ACR Breast Density Category c: The breast tissue is heterogeneously
dense, which may obscure small masses.
FINDINGS: No suspicious masses or calcifications are seen in either breast.
Postsurgical changes are present in the central far posterior left
breast related to prior lumpectomy. Spot compression magnification
CC view of the lumpectomy site in the left breast was performed.
There is no mammographic evidence of locally recurrent malignancy.
Few scattered benign-appearing calcifications are seen in the region
of the lumpectomy site.

Mammographic images were processed with CAD.
IMPRESSION: No mammographic evidence of malignancy in either breast.

RECOMMENDATION:
Diagnostic mammogram is suggested in 1 year. (Code:DZ-Z-3Q2)

I have discussed the findings and recommendations with the patient.
Results were also provided in writing at the conclusion of the
visit. If applicable, a reminder letter will be sent to the patient
regarding the next appointment.

BI-RADS CATEGORY  2: Benign.

## 2019-02-19 DIAGNOSIS — R82998 Other abnormal findings in urine: Secondary | ICD-10-CM | POA: Diagnosis not present

## 2019-02-19 DIAGNOSIS — E611 Iron deficiency: Secondary | ICD-10-CM | POA: Diagnosis not present

## 2019-02-19 DIAGNOSIS — E119 Type 2 diabetes mellitus without complications: Secondary | ICD-10-CM | POA: Diagnosis not present

## 2019-02-19 DIAGNOSIS — Z0000 Encounter for general adult medical examination without abnormal findings: Secondary | ICD-10-CM | POA: Diagnosis not present

## 2019-02-19 DIAGNOSIS — E038 Other specified hypothyroidism: Secondary | ICD-10-CM | POA: Diagnosis not present

## 2019-02-22 DIAGNOSIS — E039 Hypothyroidism, unspecified: Secondary | ICD-10-CM | POA: Diagnosis not present

## 2019-03-01 ENCOUNTER — Telehealth: Payer: Self-pay | Admitting: *Deleted

## 2019-03-01 NOTE — Telephone Encounter (Signed)
A message was left, re: her new patient appointment. 

## 2019-03-12 DIAGNOSIS — Z23 Encounter for immunization: Secondary | ICD-10-CM | POA: Diagnosis not present

## 2019-03-12 DIAGNOSIS — E119 Type 2 diabetes mellitus without complications: Secondary | ICD-10-CM | POA: Diagnosis not present

## 2019-03-12 DIAGNOSIS — Z1212 Encounter for screening for malignant neoplasm of rectum: Secondary | ICD-10-CM | POA: Diagnosis not present

## 2019-03-12 DIAGNOSIS — R0609 Other forms of dyspnea: Secondary | ICD-10-CM | POA: Diagnosis not present

## 2019-03-12 DIAGNOSIS — C50919 Malignant neoplasm of unspecified site of unspecified female breast: Secondary | ICD-10-CM | POA: Diagnosis not present

## 2019-03-12 DIAGNOSIS — E039 Hypothyroidism, unspecified: Secondary | ICD-10-CM | POA: Diagnosis not present

## 2019-03-12 DIAGNOSIS — Z1331 Encounter for screening for depression: Secondary | ICD-10-CM | POA: Diagnosis not present

## 2019-03-12 DIAGNOSIS — Z0000 Encounter for general adult medical examination without abnormal findings: Secondary | ICD-10-CM | POA: Diagnosis not present

## 2019-03-15 DIAGNOSIS — E039 Hypothyroidism, unspecified: Secondary | ICD-10-CM | POA: Diagnosis not present

## 2019-03-15 DIAGNOSIS — E1165 Type 2 diabetes mellitus with hyperglycemia: Secondary | ICD-10-CM | POA: Diagnosis not present

## 2019-03-15 DIAGNOSIS — E6609 Other obesity due to excess calories: Secondary | ICD-10-CM | POA: Diagnosis not present

## 2019-05-17 ENCOUNTER — Other Ambulatory Visit: Payer: Self-pay | Admitting: Oncology

## 2019-05-17 DIAGNOSIS — Z1231 Encounter for screening mammogram for malignant neoplasm of breast: Secondary | ICD-10-CM

## 2019-07-02 ENCOUNTER — Ambulatory Visit: Payer: BLUE CROSS/BLUE SHIELD

## 2019-07-20 ENCOUNTER — Telehealth: Payer: Self-pay | Admitting: Oncology

## 2019-07-20 NOTE — Telephone Encounter (Signed)
Rescheduled per 2/22 sch msg, pt req. Called and left a msg. Mailing printout  

## 2019-07-27 ENCOUNTER — Ambulatory Visit: Payer: BLUE CROSS/BLUE SHIELD | Admitting: Oncology

## 2019-07-27 ENCOUNTER — Other Ambulatory Visit: Payer: BLUE CROSS/BLUE SHIELD

## 2019-07-27 ENCOUNTER — Ambulatory Visit: Payer: BC Managed Care – PPO

## 2019-07-30 ENCOUNTER — Other Ambulatory Visit: Payer: Self-pay | Admitting: *Deleted

## 2019-07-30 DIAGNOSIS — Z170 Estrogen receptor positive status [ER+]: Secondary | ICD-10-CM

## 2019-07-30 DIAGNOSIS — C50512 Malignant neoplasm of lower-outer quadrant of left female breast: Secondary | ICD-10-CM

## 2019-08-01 NOTE — Progress Notes (Signed)
[place]  [phone] [phone]    ID: Lori Vincent DOB: [birth year]  MR#: [id number]  XFG#:[id number]  Patient Care Team: Marton Redwood, MD as PCP - General (Internal Medicine) Arloa Koh, MD (Inactive) as Consulting Physician (Radiation Oncology) Vanya Carberry, Virgie Dad, MD as Consulting Physician (Oncology) [name], MD as Consulting Physician (General Surgery) Richmond Campbell, MD as Consulting Physician (Gastroenterology) Dian Queen, MD as Consulting Physician (Obstetrics and Gynecology) OTHER MD:    CHIEF COMPLAINT: estrogen receptor positive breast cancer  CURRENT TREATMENT: tamoxifen    INTERVAL HISTORY: Lori Vincent returns today for follow-up of her estrogen receptor positive breast cancer. She continues on tamoxifen.  Which she continues to tolerated remarkably well.  Hot flashes are not a major issue neither is vaginal wetness.  She had bilateral mammography today and we looked at the images but the formal reading is pending  She was previously mildly anemic and was been found to be iron deficient.  She was previously on oral iron supplementation with poor tolerance. She received feraheme on 07/28/2018 and 08/06/2018.  Currently her hemoglobin has corrected to 13.5.  MCV is 91.5   REVIEW OF SYSTEMS: Lori Vincent lost about 3 months of work because of the pandemic but work has come roaring back.  She has had her first dose of the COVID-19 vaccine, 07/31/2019, and continues to take precautions since most of the people she works with her young and therefore will not be vaccinated for another month or 2.  She is not exercising regularly but she is on her feet at work and tells me she gets [phone] steps a day just at work.  A detailed review of systems today was otherwise stable.   BREAST CANCER HISTORY: From the original intake note:  [name] showing a possible mass in the left  breast. Diagnostic left mammography and ultrasonography at the breast center 04/26/2013 confirmed an oval mass with microcalcifications in the 6:00 position of the left breast. There was an adjacent partially obscured 1.2 cm mass. On physical exam there was no palpable mass in the left breast. By ultrasonography, there was an oval mass at the 6:00 position measuring 1.4 cm, with a second mass measuring 1.1 cm. There were no abnormal lymph nodes noted in the left axilla.  Biopsy of the 6:00 mass in the left breast January 2015 showed ([id number]) and invasive ductal carcinoma, grade 3, estrogen receptor 97% positive, progesterone receptor 75% positive, with strong staining intensity, and MIB-144%. HER-2 was amplified, with the signals ratio 4.18 and in number per cell of 9.40 .  On 06/08/2013 the patient underwent bilateral breast MRI which showed a dumbbell-shaped area in the central left breast consisting of a 2 cm mass and 2 extensions or an adjacent masses, measuring a total of 2.9 cm. There were no abnormal appearing lymph nodes for any other areas of concern.  The patient's subsequent history is as detailed below.   PAST MEDICAL HISTORY: Past Medical History:  Diagnosis Date  . Anemia due to chemotherapy   . Anxiety   . Arthritis    knees and feet  . Breast cancer ([facility]) 06/03/13   left, 6:00 o'clock  . Diabetes mellitus without complication ([place])   . Family history of colon cancer   . History of blood transfusion 12/2013   [facility] - 2 units transfused - during chemo  . History of radiation therapy 01/18/14- 03/02/14   left breast 4680 cGy 26  sessions, boost of 1000 cGy 5 sessions  . Hypokalemia    during with chemo 08/2013  . Hypothyroidism   . Neuropathy    bottom of feet, mild  . Personal history of chemotherapy   . Personal history of radiation therapy     PAST SURGICAL HISTORY: Past Surgical History:  Procedure Laterality Date  . BREAST BIOPSY  1/14   lt  . BREAST LUMPECTOMY Left     2015  . BREAST LUMPECTOMY WITH NEEDLE LOCALIZATION AND AXILLARY SENTINEL LYMPH NODE BX Left 07/26/2013   Procedure: BREAST LUMPECTOMY WITH NEEDLE LOCALIZATION AND AXILLARY SENTINEL LYMPH NODE BX;  Surgeon: Shann Medal, MD;  Location: [facility];  Service: General;  Laterality: Left;  . CESAREAN SECTION  04/1999  . CHOLECYSTECTOMY N/A 05/16/2017   Procedure: LAPAROSCOPIC CHOLECYSTECTOMY WITH INTRAOPERATIVE CHOLANGIOGRAM;  Surgeon: [name], MD;  Location: [facility];  Service: General;  Laterality: N/A;  . COLONOSCOPY  2013  . DIAGNOSTIC LAPAROSCOPY     laser endometriosis  . DILATION AND CURETTAGE OF UTERUS    . DILITATION & CURRETTAGE/HYSTROSCOPY WITH HYDROTHERMAL ABLATION N/A 09/02/2014   Procedure: DILATATION & CURETTAGE/HYSTEROSCOPY WITH HYDROTHERMAL ABLATION;  Surgeon: Dian Queen, MD;  Location: [facility];  Service: Gynecology;  Laterality: N/A;  . LAPAROSCOPIC VAGINAL HYSTERECTOMY WITH SALPINGO OOPHORECTOMY Bilateral 09/30/2016   Procedure: LAPAROSCOPIC ASSISTED VAGINAL HYSTERECTOMY WITH BILATERAL SALPINGO OOPHORECTOMY;  Surgeon: Dian Queen, MD;  Location: [facility];  Service: Gynecology;  Laterality: Bilateral;  . MOLE REMOVAL Right 07/26/2013   Procedure: MOLE REMOVAL UNDER RIGHT BREAST;  Surgeon: Shann Medal, MD;  Location: [facility];  Service: General;  Laterality: Right;  . PORTACATH PLACEMENT Right 07/26/2013   Procedure: INSERTION PORT-A-CATH;  Surgeon: Shann Medal, MD;  Location: [facility];  Service: General;  Laterality: Right;  . TONSILLECTOMY  1972  . WISDOM TOOTH EXTRACTION      FAMILY HISTORY: Family History  Problem Relation Age of Onset  . Stroke Mother   . Diabetes Father   . Diabetes type I Father   . Colon cancer Maternal Grandmother 63  . Healthy Sister   . Healthy Brother   . Pulmonary embolism Maternal Uncle   . Colon cancer Paternal Uncle 64  . Stomach cancer Maternal Grandfather 94       pipe smoker   . Diabetes type I Paternal Grandmother   . Lung cancer Paternal Grandfather   . Heart Problems Paternal Uncle    The patient's father died at the age of 35 in a car accident. The patient's mother died at the age of 75 from a stroke. The patient has one adopted brother. She has one biological sister. The patient's mother's mother was diagnosed with colon cancer at the age of 42. There is no history of breast oropharynx cancer in the family to the patient's knowledge.  She asked about her daughter needing testing for BRCA and I said no.   GYNECOLOGIC HISTORY:   (Updated 07/21/2017) Menarche age 67. First live birth age 24. The patient is GX P1. She took birth control pills for approximately 12 years remotely. She had a period in February of 2014 and then in December of 2014. She had been started on hormone replacement in December. That has since been discontinued. The patient is now status post total hysterectomy with BSO since May 2018.   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 Her husband died at [facility] with soft tissue sarcoma. The patient's significant other [name] also works as a Geophysicist/field seismologist.  They are currently living together and are engaged.  The patient's daughter Ava, 38, is studying communications and media at [employer].. The patient has 2 stepchildren from her earlier marriage. [name] is a Games developer in [place] and [place] (masc.) studies criminal Justice.   ADVANCED DIRECTIVES: she has named [name] as her healthcare power of attorney. He can be reached at [place]:  Social History   Tobacco Use  . Smoking status: Never Smoker  . Smokeless tobacco: Never Used  Substance Use Topics  . Alcohol use: Yes    Comment: 5-7 glasses per week of red wine  . Drug use: No     Colonoscopy: July  2014  PAP: November 2014  Bone density: November 2014  Lipid panel: Not on file  Allergies  Allergen Reactions  . Compazine [Prochlorperazine] Other (See Comments)    Extreme restlessness and mild extrapyramidal movement;  Patient tolerates Reglan with no problems    Current Outpatient Medications  Medication Sig Dispense Refill  . citalopram (CELEXA) 10 MG tablet Take 10 mg by mouth daily.    Marland Kitchen gabapentin (NEURONTIN) 300 MG capsule Take 1 capsule (300 mg total) by mouth at bedtime. 90 capsule 4  . levothyroxine (SYNTHROID, LEVOTHROID) 100 MCG tablet Take 100 mcg by mouth 2 (two) times a week. Takes 100 mcg on Saturdays and Sundays only    . metFORMIN (GLUCOPHAGE) 1000 MG tablet Take 1,000 mg by mouth 2 (two) times daily with a meal.    . SYNTHROID 112 MCG tablet TAKE 1 TABLET BY MOUTH ONCE DAILY MONDAY - FRIDAY  1  . tamoxifen (NOLVADEX) 20 MG tablet TAKE 1 TABLET(20 MG) BY MOUTH DAILY 90 tablet 3   No current facility-administered medications for this visit.    OBJECTIVE: Middle-aged white woman who appears younger than stated age  75:   08/02/19 1447  BP: 123/70  Pulse: 100  Resp: 20  Temp: 98 F (36.7 C)  SpO2: 98%   Body mass index is 29.19 kg/m.    ECOG FS: 0 Filed Weights   08/02/19 1447  Weight: 186 lb 6.4 oz (84.6 kg)    Sclerae unicteric, EOMs intact Wearing a mask No cervical or supraclavicular adenopathy Lungs no rales or rhonchi Heart regular rate and rhythm Abd soft, nontender, positive bowel sounds MSK no focal spinal tenderness, no upper extremity lymphedema Neuro: nonfocal, well oriented, appropriate affect Breasts: The right breast is unremarkable.  The left breast has undergone lumpectomy followed by radiation.  There is no evidence of disease recurrence.  Both axillae are benign.   LAB RESULTS:  CBC    Component Value Date/Time   WBC 7.0 08/02/2019 1420   WBC 5.0 05/15/2017 0930   RBC 4.61 08/02/2019 1420   HGB 13.5 08/02/2019 1420    HGB 12.4 07/09/2016 1443   HCT 42.2 08/02/2019 1420   HCT 37.7 07/09/2016 1443   PLT 185 08/02/2019 1420   PLT 172 07/09/2016 1443   MCV 91.5 08/02/2019 1420   MCV 88.7 07/09/2016 1443   MCH 29.3 08/02/2019 1420   MCHC 32.0 08/02/2019 1420   RDW 13.7 08/02/2019 1420   RDW 14.5 07/09/2016 1443   LYMPHSABS 1.7 08/02/2019 1420   LYMPHSABS 1.8 07/09/2016 1443   MONOABS 0.6 08/02/2019 1420   MONOABS 0.4  07/09/2016 1443   EOSABS 0.2 08/02/2019 1420   EOSABS 0.2 07/09/2016 1443   BASOSABS 0.1 08/02/2019 1420   BASOSABS 0.0 07/09/2016 1443    CMP     Component Value Date/Time   NA 142 08/02/2019 1420   NA 139 07/09/2016 1443   K 3.7 08/02/2019 1420   K 3.4 (L) 07/09/2016 1443   CL 103 08/02/2019 1420   CO2 27 08/02/2019 1420   CO2 26 07/09/2016 1443   GLUCOSE 146 (H) 08/02/2019 1420   GLUCOSE 203 (H) 07/09/2016 1443   BUN 20 08/02/2019 1420   BUN 13.1 07/09/2016 1443   CREATININE 0.79 08/02/2019 1420   CREATININE 0.9 07/09/2016 1443   CALCIUM 9.4 08/02/2019 1420   CALCIUM 9.2 07/09/2016 1443   PROT 6.9 08/02/2019 1420   PROT 6.7 07/09/2016 1443   ALBUMIN 4.0 08/02/2019 1420   ALBUMIN 3.7 07/09/2016 1443   AST 17 08/02/2019 1420   AST 23 07/09/2016 1443   ALT 21 08/02/2019 1420   ALT 27 07/09/2016 1443   ALKPHOS 111 08/02/2019 1420   ALKPHOS 85 07/09/2016 1443   BILITOT 0.4 08/02/2019 1420   BILITOT 0.45 07/09/2016 1443   GFRNONAA >60 08/02/2019 1420   GFRAA >60 08/02/2019 1420    STUDIES: No results found.   ASSESSMENT: 64 y.o. [place] woman, status post left breast biopsy 06/03/2013 for a clinical T2 N0, stage IIA invasive ductal carcinoma, grade 3, estrogen and progesterone receptor positive, with an MIB-1 of 44%, and HER-2 amplified.  (1) status post left lumpectomy and sentinel lymph node sampling 07/26/2013 for a pT2 pN0, stage IIA invasive ductal carcinoma, grade 3, with negative margins.  (2) treated in the adjuvant setting with docetaxel/carboplatin  (AUC 5) given along with trastuzumab/pertuzumab x6 cycles, first dose on 09/02/2013, last dose 12/16/2013  (3) trastuzumab continued through through March 2016  (4) adjuvant radiation completed 03/02/2014  (5) tamoxifen started 04/26/2014  (a) status post hysterectomy with bilateral salpingo-oophorectomy 09/30/2016, with benign pathology  (6)  Depression/anxiety - on citalopram  (7) chemotherapy-induced anemia -- resolved  (8) vaginal dryness/dyspareunia: s/p MonaLisa procedure; on vaginal estrogens  (9)   Genetics testing 02/14/2015  Through thecustom cancer gene panel offered by [facility] found no deleterious mutations in ATM, BARD1, BRCA1, BRCA2, BRIP1, CDH1, CHEK2, EPCAM, MLH1, MRE11A, MSH2, MSH6, MUTYH, NBN, NF1, PALB2, PMS2, PTEN, RAD50, RAD51C, RAD51D, SMARCA4, STK11, and TP53.    PLAN: Zakiah is now 6 years out from definitive surgery for breast cancer with no evidence of disease recurrence.  This is very favorable.  She completed 5 years of tamoxifen in December and she could have stopped the medication then.  We discussed that at length today.  After much discussion she would be more comfortable continuing the tamoxifen for another 5 years.  The benefits there are a small further decrease in the risk of recurrence (about 2%), prevention of osteopenia/osteoporosis, and the opportunity if she wishes to use vaginal estrogens safely.  We do not have the mammography report yet but I will make it available to her as soon as it comes across epic.  She will see me again in 1 year.  She knows to call for any other issue that may develop before then  Total encounter time 25 minutes.*  Zyrus Hetland, Virgie Dad, MD  08/02/19 6:42 PM Medical Oncology and Hematology [facility] [place], [place] [address] Tel. [phone]    Fax. [phone]   I, Wilburn Mylar, am acting as scribe  for Dr. Sarajane Jews C. Ustin Cruickshank.  I, Lurline Del MD, have reviewed the above  documentation for accuracy and completeness, and I agree with the above.    *Total Encounter Time as defined by the Centers for Medicare and Medicaid Services includes, in addition to the face-to-face time of a patient visit (documented in the note above) non-face-to-face time: obtaining and reviewing outside history, ordering and reviewing medications, tests or procedures, care coordination (communications with other health care professionals or caregivers) and documentation in the medical record.

## 2019-08-02 ENCOUNTER — Inpatient Hospital Stay (HOSPITAL_BASED_OUTPATIENT_CLINIC_OR_DEPARTMENT_OTHER): Payer: BC Managed Care – PPO | Admitting: Oncology

## 2019-08-02 ENCOUNTER — Telehealth: Payer: Self-pay | Admitting: Oncology

## 2019-08-02 ENCOUNTER — Ambulatory Visit
Admission: RE | Admit: 2019-08-02 | Discharge: 2019-08-02 | Disposition: A | Discharge status: Home / Self Care | Payer: BC Managed Care – PPO | Source: Ambulatory Visit | Attending: Oncology | Admitting: Oncology

## 2019-08-02 ENCOUNTER — Inpatient Hospital Stay: Payer: BC Managed Care – PPO | Attending: Oncology

## 2019-08-02 ENCOUNTER — Ambulatory Visit: Payer: BC Managed Care – PPO

## 2019-08-02 ENCOUNTER — Other Ambulatory Visit: Payer: Self-pay

## 2019-08-02 VITALS — BP 123/70 | HR 100 | Temp 98.0°F | Resp 20 | Ht 67.0 in | Wt 186.4 lb

## 2019-08-02 DIAGNOSIS — Z79810 Long term (current) use of selective estrogen receptor modulators (SERMs): Secondary | ICD-10-CM | POA: Insufficient documentation

## 2019-08-02 DIAGNOSIS — Z90710 Acquired absence of both cervix and uterus: Secondary | ICD-10-CM | POA: Diagnosis not present

## 2019-08-02 DIAGNOSIS — C50912 Malignant neoplasm of unspecified site of left female breast: Secondary | ICD-10-CM | POA: Insufficient documentation

## 2019-08-02 DIAGNOSIS — Z7984 Long term (current) use of oral hypoglycemic drugs: Secondary | ICD-10-CM | POA: Diagnosis not present

## 2019-08-02 DIAGNOSIS — E039 Hypothyroidism, unspecified: Secondary | ICD-10-CM | POA: Insufficient documentation

## 2019-08-02 DIAGNOSIS — Z923 Personal history of irradiation: Secondary | ICD-10-CM | POA: Diagnosis not present

## 2019-08-02 DIAGNOSIS — F329 Major depressive disorder, single episode, unspecified: Secondary | ICD-10-CM | POA: Insufficient documentation

## 2019-08-02 DIAGNOSIS — E119 Type 2 diabetes mellitus without complications: Secondary | ICD-10-CM | POA: Insufficient documentation

## 2019-08-02 DIAGNOSIS — Z1231 Encounter for screening mammogram for malignant neoplasm of breast: Secondary | ICD-10-CM | POA: Diagnosis not present

## 2019-08-02 DIAGNOSIS — C50512 Malignant neoplasm of lower-outer quadrant of left female breast: Secondary | ICD-10-CM | POA: Diagnosis not present

## 2019-08-02 DIAGNOSIS — Z170 Estrogen receptor positive status [ER+]: Secondary | ICD-10-CM

## 2019-08-02 DIAGNOSIS — D6481 Anemia due to antineoplastic chemotherapy: Secondary | ICD-10-CM | POA: Insufficient documentation

## 2019-08-02 LAB — CBC WITH DIFFERENTIAL (CANCER CENTER ONLY)
Abs Immature Granulocytes: 0.01 10*3/uL (ref 0.00–0.07)
Basophils Absolute: 0.1 10*3/uL (ref 0.0–0.1)
Basophils Relative: 1 %
Eosinophils Absolute: 0.2 10*3/uL (ref 0.0–0.5)
Eosinophils Relative: 3 %
HCT: 42.2 % (ref 36.0–46.0)
Hemoglobin: 13.5 g/dL (ref 12.0–15.0)
Immature Granulocytes: 0 %
Lymphocytes Relative: 24 %
Lymphs Abs: 1.7 10*3/uL (ref 0.7–4.0)
MCH: 29.3 pg (ref 26.0–34.0)
MCHC: 32 g/dL (ref 30.0–36.0)
MCV: 91.5 fL (ref 80.0–100.0)
Monocytes Absolute: 0.6 10*3/uL (ref 0.1–1.0)
Monocytes Relative: 8 %
Neutro Abs: 4.5 10*3/uL (ref 1.7–7.7)
Neutrophils Relative %: 64 %
Platelet Count: 185 10*3/uL (ref 150–400)
RBC: 4.61 MIL/uL (ref 3.87–5.11)
RDW: 13.7 % (ref 11.5–15.5)
WBC Count: 7 10*3/uL (ref 4.0–10.5)
nRBC: 0 % (ref 0.0–0.2)

## 2019-08-02 LAB — CMP (CANCER CENTER ONLY)
ALT: 21 U/L (ref 0–44)
AST: 17 U/L (ref 15–41)
Albumin: 4 g/dL (ref 3.5–5.0)
Alkaline Phosphatase: 111 U/L (ref 38–126)
Anion gap: 12 (ref 5–15)
BUN: 20 mg/dL (ref 8–23)
CO2: 27 mmol/L (ref 22–32)
Calcium: 9.4 mg/dL (ref 8.9–10.3)
Chloride: 103 mmol/L (ref 98–111)
Creatinine: 0.79 mg/dL (ref 0.44–1.00)
GFR, Est AFR Am: >60 mL/min (ref >60)
GFR, Estimated: >60 mL/min (ref >60)
Glucose, Bld: 146 mg/dL — ABNORMAL HIGH (ref 70–99)
Potassium: 3.7 mmol/L (ref 3.5–5.1)
Sodium: 142 mmol/L (ref 135–145)
Total Bilirubin: 0.4 mg/dL (ref 0.3–1.2)
Total Protein: 6.9 g/dL (ref 6.5–8.1)

## 2019-08-02 NOTE — Telephone Encounter (Signed)
I left a message regarding schedule  

## 2019-08-26 DIAGNOSIS — Z23 Encounter for immunization: Secondary | ICD-10-CM | POA: Diagnosis not present

## 2019-08-27 ENCOUNTER — Ambulatory Visit: Payer: BC Managed Care – PPO

## 2019-09-11 ENCOUNTER — Other Ambulatory Visit: Payer: Self-pay | Admitting: Oncology

## 2020-03-15 DIAGNOSIS — Z23 Encounter for immunization: Secondary | ICD-10-CM | POA: Diagnosis not present

## 2020-03-20 DIAGNOSIS — E611 Iron deficiency: Secondary | ICD-10-CM | POA: Diagnosis not present

## 2020-03-21 DIAGNOSIS — E119 Type 2 diabetes mellitus without complications: Secondary | ICD-10-CM | POA: Diagnosis not present

## 2020-03-21 DIAGNOSIS — E611 Iron deficiency: Secondary | ICD-10-CM | POA: Diagnosis not present

## 2020-03-21 DIAGNOSIS — E039 Hypothyroidism, unspecified: Secondary | ICD-10-CM | POA: Diagnosis not present

## 2020-03-21 DIAGNOSIS — Z0000 Encounter for general adult medical examination without abnormal findings: Secondary | ICD-10-CM | POA: Diagnosis not present

## 2020-04-06 DIAGNOSIS — Z1331 Encounter for screening for depression: Secondary | ICD-10-CM | POA: Diagnosis not present

## 2020-04-06 DIAGNOSIS — Z1389 Encounter for screening for other disorder: Secondary | ICD-10-CM | POA: Diagnosis not present

## 2020-04-06 DIAGNOSIS — R82998 Other abnormal findings in urine: Secondary | ICD-10-CM | POA: Diagnosis not present

## 2020-04-06 DIAGNOSIS — Z0000 Encounter for general adult medical examination without abnormal findings: Secondary | ICD-10-CM | POA: Diagnosis not present

## 2020-04-06 DIAGNOSIS — E119 Type 2 diabetes mellitus without complications: Secondary | ICD-10-CM | POA: Diagnosis not present

## 2020-04-17 DIAGNOSIS — E039 Hypothyroidism, unspecified: Secondary | ICD-10-CM | POA: Diagnosis not present

## 2020-04-17 DIAGNOSIS — E6609 Other obesity due to excess calories: Secondary | ICD-10-CM | POA: Diagnosis not present

## 2020-04-17 DIAGNOSIS — E1165 Type 2 diabetes mellitus with hyperglycemia: Secondary | ICD-10-CM | POA: Diagnosis not present

## 2020-04-19 DIAGNOSIS — Z1212 Encounter for screening for malignant neoplasm of rectum: Secondary | ICD-10-CM | POA: Diagnosis not present

## 2020-04-19 DIAGNOSIS — E119 Type 2 diabetes mellitus without complications: Secondary | ICD-10-CM | POA: Diagnosis not present

## 2020-06-08 DIAGNOSIS — E039 Hypothyroidism, unspecified: Secondary | ICD-10-CM | POA: Diagnosis not present

## 2020-06-13 ENCOUNTER — Other Ambulatory Visit: Payer: Self-pay | Admitting: Oncology

## 2020-06-13 DIAGNOSIS — Z1231 Encounter for screening mammogram for malignant neoplasm of breast: Secondary | ICD-10-CM

## 2020-06-14 DIAGNOSIS — Z01419 Encounter for gynecological examination (general) (routine) without abnormal findings: Secondary | ICD-10-CM | POA: Diagnosis not present

## 2020-06-14 DIAGNOSIS — R6882 Decreased libido: Secondary | ICD-10-CM | POA: Diagnosis not present

## 2020-06-14 DIAGNOSIS — Z6828 Body mass index (BMI) 28.0-28.9, adult: Secondary | ICD-10-CM | POA: Diagnosis not present

## 2020-08-02 ENCOUNTER — Ambulatory Visit: Payer: BC Managed Care – PPO

## 2020-08-14 DIAGNOSIS — E039 Hypothyroidism, unspecified: Secondary | ICD-10-CM | POA: Diagnosis not present

## 2020-08-30 ENCOUNTER — Telehealth: Payer: Self-pay | Admitting: Oncology

## 2020-08-30 NOTE — Telephone Encounter (Signed)
R/s appt per 4/6 sch msg. Pt aware.  

## 2020-08-31 ENCOUNTER — Inpatient Hospital Stay: Payer: BC Managed Care – PPO

## 2020-08-31 ENCOUNTER — Inpatient Hospital Stay: Payer: BC Managed Care – PPO | Admitting: Oncology

## 2020-09-25 ENCOUNTER — Other Ambulatory Visit: Payer: Self-pay

## 2020-09-25 ENCOUNTER — Ambulatory Visit
Admission: RE | Admit: 2020-09-25 | Discharge: 2020-09-25 | Disposition: A | Discharge status: Home / Self Care | Payer: BC Managed Care – PPO | Source: Ambulatory Visit | Attending: Oncology | Admitting: Oncology

## 2020-09-25 DIAGNOSIS — Z1231 Encounter for screening mammogram for malignant neoplasm of breast: Secondary | ICD-10-CM

## 2020-10-04 NOTE — Progress Notes (Signed)
[facility]  [phone] [phone]    ID: Lori Vincent DOB: 01-17-1956  MR#: [id number]  IHK#:[id number]  Patient Care Team: Ginger Organ., MD as PCP - General (Internal Medicine) Arloa Koh, MD (Inactive) as Consulting Physician (Radiation Oncology) Magrinat, Virgie Dad, MD as Consulting Physician (Oncology) Richmond Campbell, MD as Consulting Physician (Gastroenterology) Dian Queen, MD as Consulting Physician (Obstetrics and Gynecology) OTHER MD:    CHIEF COMPLAINT: estrogen receptor positive breast cancer  CURRENT TREATMENT: Completed 6 years of tamoxifen    INTERVAL HISTORY: Lori Vincent (65 years old) returns today for follow-up of her estrogen receptor positive breast cancer.   After further thought and based on our prior discussion she stopped tamoxifen December 2021.  She has noted fewer hot flashes--has no hot flashes now.  Her hair was thinning and it has not thinned anymore but has not yet started growing.  She really has not noted any other differences.  Since her last visit, she underwent bilateral screening mammography with tomography at [facility] on 09/25/2020 showing: breast density category C; no evidence of malignancy in either breast.    REVIEW OF SYSTEMS: Lori Vincent continues to work full-time and at work she gets between 5 and 6000 steps a day.  She has worked at losing weight over the last 63 years and is very pleased with the results.  She has a good diet with no sugar and low carbs and she goes to Pilates and does some walking for exercise.  A detailed review of systems today was stable.   COVID 19 VACCINATION STATUS: Status post vaccination x2 with booster November 2021   BREAST CANCER HISTORY: From the original intake note:  Lori Vincent [facility] showing a possible mass in the left breast. Diagnostic left mammography and ultrasonography at the breast center 04/26/2013 confirmed  an oval mass with microcalcifications in the 6:00 position of the left breast. There was an adjacent partially obscured 1.2 cm mass. On physical exam there was no palpable mass in the left breast. By ultrasonography, there was an oval mass at the 6:00 position measuring 1.4 cm, with a second mass measuring 1.1 cm. There were no abnormal lymph nodes noted in the left axilla.  Biopsy of the 6:00 mass in the left breast January 2015 showed ([id number]) and invasive ductal carcinoma, grade 3, estrogen receptor 97% positive, progesterone receptor 75% positive, with strong staining intensity, and MIB-144%. HER-2 was amplified, with the signals ratio 4.18 and in number per cell of 9.40 .  On 06/08/2013 the patient underwent bilateral breast MRI which showed a dumbbell-shaped area in the central left breast consisting of a 2 cm mass and 2 extensions or an adjacent masses, measuring a total of 2.9 cm. There were no abnormal appearing lymph nodes for any other areas of concern.  The patient's subsequent history is as detailed below.   PAST MEDICAL HISTORY: Past Medical History:  Diagnosis Date  . Anemia due to chemotherapy   . Anxiety   . Arthritis    knees and feet  . Breast cancer ([facility]) 06/03/13   left, 6:00 o'clock  . Diabetes mellitus without complication ([place])   . Family history of colon cancer   . History of blood transfusion 12/2013   [facility] - 2 units transfused - during chemo  . History of radiation therapy 01/18/14- 03/02/14   left breast 4680 cGy 26 sessions, boost of 1000 cGy 5 sessions  . Hypokalemia  during with chemo 08/2013  . Hypothyroidism   . Neuropathy    bottom of feet, mild  . Personal history of chemotherapy   . Personal history of radiation therapy     PAST SURGICAL HISTORY: Past Surgical History:  Procedure Laterality Date  . BREAST BIOPSY  1/14   lt  . BREAST LUMPECTOMY Left    2015  . BREAST LUMPECTOMY WITH NEEDLE LOCALIZATION AND AXILLARY SENTINEL LYMPH NODE BX Left  07/26/2013   Procedure: BREAST LUMPECTOMY WITH NEEDLE LOCALIZATION AND AXILLARY SENTINEL LYMPH NODE BX;  Surgeon: Shann Medal, MD;  Location: [facility];  Service: General;  Laterality: Left;  . CESAREAN SECTION  04/1999  . CHOLECYSTECTOMY N/A 05/16/2017   Procedure: LAPAROSCOPIC CHOLECYSTECTOMY WITH INTRAOPERATIVE CHOLANGIOGRAM;  Surgeon: [name], MD;  Location: [facility];  Service: General;  Laterality: N/A;  . COLONOSCOPY  2013  . DIAGNOSTIC LAPAROSCOPY     laser endometriosis  . DILATION AND CURETTAGE OF UTERUS    . DILITATION & CURRETTAGE/HYSTROSCOPY WITH HYDROTHERMAL ABLATION N/A 09/02/2014   Procedure: DILATATION & CURETTAGE/HYSTEROSCOPY WITH HYDROTHERMAL ABLATION;  Surgeon: Dian Queen, MD;  Location: [facility];  Service: Gynecology;  Laterality: N/A;  . LAPAROSCOPIC VAGINAL HYSTERECTOMY WITH SALPINGO OOPHORECTOMY Bilateral 09/30/2016   Procedure: LAPAROSCOPIC ASSISTED VAGINAL HYSTERECTOMY WITH BILATERAL SALPINGO OOPHORECTOMY;  Surgeon: Dian Queen, MD;  Location: [facility];  Service: Gynecology;  Laterality: Bilateral;  . MOLE REMOVAL Right 07/26/2013   Procedure: MOLE REMOVAL UNDER RIGHT BREAST;  Surgeon: Shann Medal, MD;  Location: [facility];  Service: General;  Laterality: Right;  . PORTACATH PLACEMENT Right 07/26/2013   Procedure: INSERTION PORT-A-CATH;  Surgeon: Shann Medal, MD;  Location: [facility];  Service: General;  Laterality: Right;  . TONSILLECTOMY  1972  . WISDOM TOOTH EXTRACTION      FAMILY HISTORY: Family History  Problem Relation Age of Onset  . Stroke Mother   . Diabetes Father   . Diabetes type I Father   . Colon cancer Maternal Grandmother 46  . Healthy Sister   . Healthy Brother   . Pulmonary embolism Maternal Uncle   . Colon cancer Paternal Uncle 64  . Stomach cancer Maternal Grandfather 94       pipe smoker  . Diabetes type I Paternal Grandmother   . Lung cancer Paternal Grandfather   . Heart  Problems Paternal Uncle   The patient's father died at the age of 70 in a car accident. The patient's mother died at the age of 68 from a stroke. The patient has one adopted brother. She has one biological sister. The patient's mother's mother was diagnosed with colon cancer at the age of 64. There is no history of breast oropharynx cancer in the family to the patient's knowledge.  She asked about her daughter needing testing for BRCA and I said no.   GYNECOLOGIC HISTORY:   (Updated 07/21/2017) Menarche age 33. First live birth age 16. The patient is GX P1. She took birth control pills for approximately 12 years remotely. She had a period in February of 2014 and then in December of 2014. She had been started on hormone replacement in December. That has since been discontinued. The patient is now status post total hysterectomy with BSO since May 2018.   SOCIAL HISTORY:     (Updated March 2021) Lori Vincent works as a Chiropodist. She works with [employer] setting up the background, make-up, general setting, etc. Her husband died at [facility] with soft tissue sarcoma. The patient's significant other [name] also works as a Geophysicist/field seismologist.  They are currently living together and are engaged.  The patient's daughter Ava, 39, is studying communications and media at [employer].. The patient has 2 stepchildren from her earlier marriage. [name] is a Games developer in [place] and [place] (masc.) studies criminal Justice.   ADVANCED DIRECTIVES: she has named [name] as her healthcare power of attorney. He can be reached at [place]:  Social History   Tobacco Use  . Smoking status: Never Smoker  . Smokeless tobacco: Never Used  Substance Use Topics  . Alcohol use: Yes    Comment: 5-7 glasses per week of red wine  . Drug use: No     Colonoscopy: July 2014  PAP: November 2014  Bone density: November 2014  Lipid panel: Not on file  Allergies   Allergen Reactions  . Compazine [Prochlorperazine] Other (See Comments)    Extreme restlessness and mild extrapyramidal movement;  Patient tolerates Reglan with no problems    Current Outpatient Medications  Medication Sig Dispense Refill  . citalopram (CELEXA) 10 MG tablet Take 10 mg by mouth daily.    Marland Kitchen levothyroxine (SYNTHROID) 125 MCG tablet Take 125 mcg by mouth daily before breakfast. Monday thru Saturday daily  1  . rosuvastatin (CRESTOR) 10 MG tablet rosuvastatin 10 mg tablet  TAKE 1 TABLET BY MOUTH EVERY DAY AT BEDTIME    . Semaglutide, 1 MG/DOSE, (OZEMPIC, 1 MG/DOSE,) 4 MG/3ML SOPN Ozempic 1 mg/dose (4 mg/3 mL) subcutaneous pen injector  INJECT 1 MG INTO THE SKIN ONCE A WEEK.     No current facility-administered medications for this visit.    OBJECTIVE: white woman who appears younger than stated age  47:   10/05/20 0856  BP: 137/70  Pulse: 72  Resp: 18  Temp: 97.7 F (36.5 C)  SpO2: 100%   Body mass index is 24.37 kg/m.    ECOG FS: 0 Filed Weights   10/05/20 0856  Weight: 155 lb 9.6 oz (70.6 kg)    Sclerae unicteric, EOMs intact Wearing a mask No cervical or supraclavicular adenopathy Lungs no rales or rhonchi Heart regular rate and rhythm Abd soft, nontender, positive bowel sounds MSK no focal spinal tenderness, no upper extremity lymphedema Neuro: nonfocal, well oriented, appropriate affect Breasts: The right breast is benign per the left breast is status postlumpectomy and radiation.  There is moderate distortion of the contour, but no evidence of local recurrence.  Both axillae are benign.   LAB RESULTS:  CBC    Component Value Date/Time   WBC 3.4 (L) 10/05/2020 0833   RBC 4.39 10/05/2020 0833   HGB 12.8 10/05/2020 0833   HGB 13.5 08/02/2019 1420   HGB 12.4 07/09/2016 1443   HCT 38.6 10/05/2020 0833   HCT 37.7 07/09/2016 1443   PLT 168 10/05/2020 0833   PLT 185 08/02/2019 1420   PLT 172 07/09/2016 1443   MCV 87.9 10/05/2020 0833   MCV  88.7 07/09/2016 1443   MCH 29.2 10/05/2020 0833   MCHC 33.2 10/05/2020 0833   RDW 12.7 10/05/2020 0833   RDW 14.5 07/09/2016 1443   LYMPHSABS 1.3 10/05/2020 0833   LYMPHSABS 1.8 07/09/2016 1443   MONOABS 0.4 10/05/2020 0833   MONOABS 0.4 07/09/2016 1443   EOSABS 0.1 10/05/2020 0833   EOSABS 0.2 07/09/2016 1443   BASOSABS 0.0 10/05/2020 0833   BASOSABS 0.0 07/09/2016 1443    CMP  Component Value Date/Time   NA 135 10/05/2020 0833   NA 139 07/09/2016 1443   K 3.7 10/05/2020 0833   K 3.4 (L) 07/09/2016 1443   CL 99 10/05/2020 0833   CO2 29 10/05/2020 0833   CO2 26 07/09/2016 1443   GLUCOSE 343 (H) 10/05/2020 0833   GLUCOSE 203 (H) 07/09/2016 1443   BUN 10 10/05/2020 0833   BUN 13.1 07/09/2016 1443   CREATININE 0.78 10/05/2020 0833   CREATININE 0.79 08/02/2019 1420   CREATININE 0.9 07/09/2016 1443   CALCIUM 9.4 10/05/2020 0833   CALCIUM 9.2 07/09/2016 1443   PROT 6.5 10/05/2020 0833   PROT 6.7 07/09/2016 1443   ALBUMIN 3.9 10/05/2020 0833   ALBUMIN 3.7 07/09/2016 1443   AST 18 10/05/2020 0833   AST 17 08/02/2019 1420   AST 23 07/09/2016 1443   ALT 18 10/05/2020 0833   ALT 21 08/02/2019 1420   ALT 27 07/09/2016 1443   ALKPHOS 121 10/05/2020 0833   ALKPHOS 85 07/09/2016 1443   BILITOT 0.7 10/05/2020 0833   BILITOT 0.4 08/02/2019 1420   BILITOT 0.45 07/09/2016 1443   GFRNONAA >60 10/05/2020 0833   GFRNONAA >60 08/02/2019 1420   GFRAA >60 08/02/2019 1420    STUDIES: MM 3D SCREEN BREAST BILATERAL  Result Date: 09/25/2020 CLINICAL DATA:  Screening. EXAM: DIGITAL SCREENING BILATERAL MAMMOGRAM WITH TOMOSYNTHESIS AND CAD TECHNIQUE: Bilateral screening digital craniocaudal and mediolateral oblique mammograms were obtained. Bilateral screening digital breast tomosynthesis was performed. The images were evaluated with computer-aided detection. COMPARISON:  Previous exam(s). ACR Breast Density Category c: The breast tissue is heterogeneously dense, which may obscure small  masses. FINDINGS: There are no findings suspicious for malignancy. The images were evaluated with computer-aided detection. IMPRESSION: No mammographic evidence of malignancy. A result letter of this screening mammogram will be mailed directly to the patient. RECOMMENDATION: Screening mammogram in one year. (Code:SM-B-01Y) BI-RADS CATEGORY  1: Negative. Electronically Signed   By: Abelardo Diesel M.D.   On: 09/25/2020 13:56     ASSESSMENT: 65 y.o. [place] woman, status post left breast biopsy 06/03/2013 for a clinical T2 N0, stage IIA invasive ductal carcinoma, grade 3, estrogen and progesterone receptor positive, with an MIB-1 of 44%, and HER-2 amplified.  (1) status post left lumpectomy and sentinel lymph node sampling 07/26/2013 for a pT2 pN0, stage IIA invasive ductal carcinoma, grade 3, with negative margins.  (2) treated in the adjuvant setting with docetaxel/carboplatin (AUC 5) given along with trastuzumab/pertuzumab x6 cycles, first dose on 09/02/2013, last dose 12/16/2013  (3) trastuzumab continued through through March 2016  (4) adjuvant radiation completed 03/02/2014  (5) tamoxifen started 04/26/2014, discontinued December 2021  (a) status post hysterectomy with bilateral salpingo-oophorectomy 09/30/2016, with benign pathology  (6) Genetics testing 02/14/2015 through the custom cancer gene panel offered by [facility] found no deleterious mutations in ATM, BARD1, BRCA1, BRCA2, BRIP1, CDH1, CHEK2, EPCAM, MLH1, MRE11A, MSH2, MSH6, MUTYH, NBN, NF1, PALB2, PMS2, PTEN, RAD50, RAD51C, RAD51D, SMARCA4, STK11, and TP53.    PLAN: Tezra is now just over 7 years out from definitive surgery for her breast cancer with no evidence of disease recurrence.  This is very favorable.  She completed 6 years of tamoxifen.  She understands that the advantage of continuing tamoxifen beyond 5 years is small, in the 1 to 2% range.  Currently I am very comfortable with her discontinuation of that drug.  At  this point I feel comfortable releasing her to her primary care physician.  All she will need  in terms of breast cancer follow-up is her yearly mammography and a yearly physician breast exam.  She was surprised as I was by her very high blood sugar today.  I do not think this is going to be artifactual since it was drawn peripherally.  She will be discussing it with her primary care physician when she sees them later today.  Of course I will be glad to see Fanny Skates again at any point in the future if and when the need arises but as of now are making no further routine appointments for her here.  Total encounter time 25 minutes.*   Luretha Eberly, Virgie Dad, MD  10/05/20 6:59 PM Medical Oncology and Hematology [facility] [place], [place] [address] Tel. [phone]    Fax. [phone]   I, Wilburn Mylar, am acting as scribe for Dr. Virgie Dad. Jlen Wintle.  I, Lurline Del MD, have reviewed the above documentation for accuracy and completeness, and I agree with the above.   *Total Encounter Time as defined by the Centers for Medicare and Medicaid Services includes, in addition to the face-to-face time of a patient visit (documented in the note above) non-face-to-face time: obtaining and reviewing outside history, ordering and reviewing medications, tests or procedures, care coordination (communications with other health care professionals or caregivers) and documentation in the medical record.

## 2020-10-05 ENCOUNTER — Other Ambulatory Visit: Payer: Self-pay

## 2020-10-05 ENCOUNTER — Inpatient Hospital Stay: Payer: BC Managed Care – PPO | Attending: Oncology | Admitting: Oncology

## 2020-10-05 ENCOUNTER — Inpatient Hospital Stay: Payer: BC Managed Care – PPO

## 2020-10-05 VITALS — BP 137/70 | HR 72 | Temp 97.7°F | Resp 18 | Ht 67.0 in | Wt 155.6 lb

## 2020-10-05 DIAGNOSIS — Z90710 Acquired absence of both cervix and uterus: Secondary | ICD-10-CM

## 2020-10-05 DIAGNOSIS — C50512 Malignant neoplasm of lower-outer quadrant of left female breast: Secondary | ICD-10-CM

## 2020-10-05 DIAGNOSIS — Z800 Family history of malignant neoplasm of digestive organs: Secondary | ICD-10-CM | POA: Insufficient documentation

## 2020-10-05 DIAGNOSIS — Z9221 Personal history of antineoplastic chemotherapy: Secondary | ICD-10-CM | POA: Diagnosis not present

## 2020-10-05 DIAGNOSIS — Z801 Family history of malignant neoplasm of trachea, bronchus and lung: Secondary | ICD-10-CM | POA: Insufficient documentation

## 2020-10-05 DIAGNOSIS — E119 Type 2 diabetes mellitus without complications: Secondary | ICD-10-CM | POA: Diagnosis not present

## 2020-10-05 DIAGNOSIS — Z923 Personal history of irradiation: Secondary | ICD-10-CM | POA: Insufficient documentation

## 2020-10-05 DIAGNOSIS — Z853 Personal history of malignant neoplasm of breast: Secondary | ICD-10-CM | POA: Diagnosis not present

## 2020-10-05 DIAGNOSIS — Z170 Estrogen receptor positive status [ER+]: Secondary | ICD-10-CM | POA: Diagnosis not present

## 2020-10-05 DIAGNOSIS — E611 Iron deficiency: Secondary | ICD-10-CM | POA: Diagnosis not present

## 2020-10-05 LAB — CBC WITH DIFFERENTIAL/PLATELET
Abs Immature Granulocytes: 0.01 10*3/uL (ref 0.00–0.07)
Basophils Absolute: 0 10*3/uL (ref 0.0–0.1)
Basophils Relative: 1 %
Eosinophils Absolute: 0.1 10*3/uL (ref 0.0–0.5)
Eosinophils Relative: 3 %
HCT: 38.6 % (ref 36.0–46.0)
Hemoglobin: 12.8 g/dL (ref 12.0–15.0)
Immature Granulocytes: 0 %
Lymphocytes Relative: 37 %
Lymphs Abs: 1.3 10*3/uL (ref 0.7–4.0)
MCH: 29.2 pg (ref 26.0–34.0)
MCHC: 33.2 g/dL (ref 30.0–36.0)
MCV: 87.9 fL (ref 80.0–100.0)
Monocytes Absolute: 0.4 10*3/uL (ref 0.1–1.0)
Monocytes Relative: 11 %
Neutro Abs: 1.6 10*3/uL — ABNORMAL LOW (ref 1.7–7.7)
Neutrophils Relative %: 48 %
Platelets: 168 10*3/uL (ref 150–400)
RBC: 4.39 MIL/uL (ref 3.87–5.11)
RDW: 12.7 % (ref 11.5–15.5)
WBC: 3.4 10*3/uL — ABNORMAL LOW (ref 4.0–10.5)
nRBC: 0 % (ref 0.0–0.2)

## 2020-10-05 LAB — COMPREHENSIVE METABOLIC PANEL
ALT: 18 U/L (ref 0–44)
AST: 18 U/L (ref 15–41)
Albumin: 3.9 g/dL (ref 3.5–5.0)
Alkaline Phosphatase: 121 U/L (ref 38–126)
Anion gap: 7 (ref 5–15)
BUN: 10 mg/dL (ref 8–23)
CO2: 29 mmol/L (ref 22–32)
Calcium: 9.4 mg/dL (ref 8.9–10.3)
Chloride: 99 mmol/L (ref 98–111)
Creatinine, Ser: 0.78 mg/dL (ref 0.44–1.00)
GFR, Estimated: >60 mL/min (ref >60)
Glucose, Bld: 343 mg/dL — ABNORMAL HIGH (ref 70–99)
Potassium: 3.7 mmol/L (ref 3.5–5.1)
Sodium: 135 mmol/L (ref 135–145)
Total Bilirubin: 0.7 mg/dL (ref 0.3–1.2)
Total Protein: 6.5 g/dL (ref 6.5–8.1)

## 2020-10-06 ENCOUNTER — Telehealth: Payer: Self-pay | Admitting: Oncology

## 2020-10-06 NOTE — Telephone Encounter (Signed)
Scheduled appt per 5/12 sch msg. Pt aware.  

## 2020-10-24 DIAGNOSIS — H40013 Open angle with borderline findings, low risk, bilateral: Secondary | ICD-10-CM | POA: Diagnosis not present

## 2020-10-30 DIAGNOSIS — Z01812 Encounter for preprocedural laboratory examination: Secondary | ICD-10-CM | POA: Diagnosis not present

## 2020-10-30 DIAGNOSIS — E039 Hypothyroidism, unspecified: Secondary | ICD-10-CM | POA: Diagnosis not present

## 2020-10-30 DIAGNOSIS — E119 Type 2 diabetes mellitus without complications: Secondary | ICD-10-CM | POA: Diagnosis not present

## 2020-10-31 DIAGNOSIS — E119 Type 2 diabetes mellitus without complications: Secondary | ICD-10-CM | POA: Diagnosis not present

## 2020-11-06 ENCOUNTER — Other Ambulatory Visit: Payer: Self-pay

## 2020-11-06 ENCOUNTER — Inpatient Hospital Stay: Payer: BC Managed Care – PPO | Attending: Oncology

## 2020-11-06 DIAGNOSIS — Z170 Estrogen receptor positive status [ER+]: Secondary | ICD-10-CM

## 2020-11-06 DIAGNOSIS — Z853 Personal history of malignant neoplasm of breast: Secondary | ICD-10-CM | POA: Diagnosis not present

## 2020-11-06 DIAGNOSIS — E039 Hypothyroidism, unspecified: Secondary | ICD-10-CM | POA: Insufficient documentation

## 2020-11-06 DIAGNOSIS — Z79899 Other long term (current) drug therapy: Secondary | ICD-10-CM | POA: Diagnosis not present

## 2020-11-06 DIAGNOSIS — Z9221 Personal history of antineoplastic chemotherapy: Secondary | ICD-10-CM | POA: Insufficient documentation

## 2020-11-06 DIAGNOSIS — Z923 Personal history of irradiation: Secondary | ICD-10-CM | POA: Diagnosis not present

## 2020-11-06 DIAGNOSIS — Z90710 Acquired absence of both cervix and uterus: Secondary | ICD-10-CM

## 2020-11-06 DIAGNOSIS — C50512 Malignant neoplasm of lower-outer quadrant of left female breast: Secondary | ICD-10-CM

## 2020-11-06 LAB — COMPREHENSIVE METABOLIC PANEL
ALT: 16 U/L (ref 0–44)
AST: 19 U/L (ref 15–41)
Albumin: 3.9 g/dL (ref 3.5–5.0)
Alkaline Phosphatase: 91 U/L (ref 38–126)
Anion gap: 7 (ref 5–15)
BUN: 13 mg/dL (ref 8–23)
CO2: 28 mmol/L (ref 22–32)
Calcium: 9.5 mg/dL (ref 8.9–10.3)
Chloride: 103 mmol/L (ref 98–111)
Creatinine, Ser: 0.72 mg/dL (ref 0.44–1.00)
GFR, Estimated: >60 mL/min (ref >60)
Glucose, Bld: 116 mg/dL — ABNORMAL HIGH (ref 70–99)
Potassium: 3.6 mmol/L (ref 3.5–5.1)
Sodium: 138 mmol/L (ref 135–145)
Total Bilirubin: 0.5 mg/dL (ref 0.3–1.2)
Total Protein: 6.7 g/dL (ref 6.5–8.1)

## 2020-11-06 LAB — CBC WITH DIFFERENTIAL/PLATELET
Abs Immature Granulocytes: 0.01 10*3/uL (ref 0.00–0.07)
Basophils Absolute: 0 10*3/uL (ref 0.0–0.1)
Basophils Relative: 0 %
Eosinophils Absolute: 0.1 10*3/uL (ref 0.0–0.5)
Eosinophils Relative: 3 %
HCT: 39.1 % (ref 36.0–46.0)
Hemoglobin: 13.1 g/dL (ref 12.0–15.0)
Immature Granulocytes: 0 %
Lymphocytes Relative: 37 %
Lymphs Abs: 1.7 10*3/uL (ref 0.7–4.0)
MCH: 29.4 pg (ref 26.0–34.0)
MCHC: 33.5 g/dL (ref 30.0–36.0)
MCV: 87.7 fL (ref 80.0–100.0)
Monocytes Absolute: 0.4 10*3/uL (ref 0.1–1.0)
Monocytes Relative: 10 %
Neutro Abs: 2.3 10*3/uL (ref 1.7–7.7)
Neutrophils Relative %: 50 %
Platelets: 188 10*3/uL (ref 150–400)
RBC: 4.46 MIL/uL (ref 3.87–5.11)
RDW: 13.1 % (ref 11.5–15.5)
WBC: 4.6 10*3/uL (ref 4.0–10.5)
nRBC: 0 % (ref 0.0–0.2)

## 2020-11-20 DIAGNOSIS — Z01812 Encounter for preprocedural laboratory examination: Secondary | ICD-10-CM | POA: Diagnosis not present

## 2020-11-23 DIAGNOSIS — Z20822 Contact with and (suspected) exposure to covid-19: Secondary | ICD-10-CM | POA: Diagnosis not present

## 2020-11-24 DIAGNOSIS — E139 Other specified diabetes mellitus without complications: Secondary | ICD-10-CM | POA: Diagnosis not present

## 2021-02-06 DIAGNOSIS — E139 Other specified diabetes mellitus without complications: Secondary | ICD-10-CM | POA: Diagnosis not present

## 2021-05-10 ENCOUNTER — Other Ambulatory Visit (HOSPITAL_COMMUNITY): Payer: Self-pay

## 2021-05-11 ENCOUNTER — Other Ambulatory Visit (HOSPITAL_COMMUNITY): Payer: Self-pay

## 2021-05-11 ENCOUNTER — Encounter: Payer: Self-pay | Admitting: Oncology

## 2021-05-11 MED ORDER — OZEMPIC (2 MG/DOSE) 8 MG/3ML ~~LOC~~ SOPN
2.0000 mg | PEN_INJECTOR | SUBCUTANEOUS | 3 refills | Status: DC
  Filled 2021-05-11: qty 3, 28d supply, fill #0
  Filled 2021-06-11: qty 3, 28d supply, fill #1
  Filled 2021-07-11: qty 3, 28d supply, fill #2
  Filled 2021-08-08: qty 3, 28d supply, fill #3
  Filled 2021-09-06: qty 3, 28d supply, fill #4
  Filled 2021-10-01: qty 3, 28d supply, fill #5
  Filled 2021-10-29: qty 3, 28d supply, fill #6
  Filled 2021-11-20 – 2021-11-22 (×2): qty 3, 28d supply, fill #7
  Filled 2021-12-24: qty 3, 28d supply, fill #8
  Filled 2022-01-21: qty 3, 28d supply, fill #9

## 2021-05-16 DIAGNOSIS — E6609 Other obesity due to excess calories: Secondary | ICD-10-CM | POA: Diagnosis not present

## 2021-05-16 DIAGNOSIS — E1165 Type 2 diabetes mellitus with hyperglycemia: Secondary | ICD-10-CM | POA: Diagnosis not present

## 2021-05-16 DIAGNOSIS — E039 Hypothyroidism, unspecified: Secondary | ICD-10-CM | POA: Diagnosis not present

## 2021-05-18 DIAGNOSIS — E6609 Other obesity due to excess calories: Secondary | ICD-10-CM | POA: Diagnosis not present

## 2021-05-18 DIAGNOSIS — E785 Hyperlipidemia, unspecified: Secondary | ICD-10-CM | POA: Diagnosis not present

## 2021-05-18 DIAGNOSIS — Z23 Encounter for immunization: Secondary | ICD-10-CM | POA: Diagnosis not present

## 2021-05-18 DIAGNOSIS — E139 Other specified diabetes mellitus without complications: Secondary | ICD-10-CM | POA: Diagnosis not present

## 2021-05-18 DIAGNOSIS — E039 Hypothyroidism, unspecified: Secondary | ICD-10-CM | POA: Diagnosis not present

## 2021-06-11 ENCOUNTER — Encounter: Payer: Self-pay | Admitting: Oncology

## 2021-06-11 ENCOUNTER — Other Ambulatory Visit (HOSPITAL_COMMUNITY): Payer: Self-pay

## 2021-06-21 DIAGNOSIS — E139 Other specified diabetes mellitus without complications: Secondary | ICD-10-CM | POA: Diagnosis not present

## 2021-06-22 DIAGNOSIS — E119 Type 2 diabetes mellitus without complications: Secondary | ICD-10-CM | POA: Diagnosis not present

## 2021-06-22 DIAGNOSIS — Z79899 Other long term (current) drug therapy: Secondary | ICD-10-CM | POA: Diagnosis not present

## 2021-06-22 DIAGNOSIS — E039 Hypothyroidism, unspecified: Secondary | ICD-10-CM | POA: Diagnosis not present

## 2021-06-22 DIAGNOSIS — E611 Iron deficiency: Secondary | ICD-10-CM | POA: Diagnosis not present

## 2021-06-22 DIAGNOSIS — R7302 Impaired glucose tolerance (oral): Secondary | ICD-10-CM | POA: Diagnosis not present

## 2021-07-03 DIAGNOSIS — E139 Other specified diabetes mellitus without complications: Secondary | ICD-10-CM | POA: Diagnosis not present

## 2021-07-03 DIAGNOSIS — Z0000 Encounter for general adult medical examination without abnormal findings: Secondary | ICD-10-CM | POA: Diagnosis not present

## 2021-07-03 DIAGNOSIS — Z1331 Encounter for screening for depression: Secondary | ICD-10-CM | POA: Diagnosis not present

## 2021-07-03 DIAGNOSIS — R82998 Other abnormal findings in urine: Secondary | ICD-10-CM | POA: Diagnosis not present

## 2021-07-03 DIAGNOSIS — Z1339 Encounter for screening examination for other mental health and behavioral disorders: Secondary | ICD-10-CM | POA: Diagnosis not present

## 2021-07-11 ENCOUNTER — Other Ambulatory Visit (HOSPITAL_COMMUNITY): Payer: Self-pay

## 2021-07-11 DIAGNOSIS — Z1212 Encounter for screening for malignant neoplasm of rectum: Secondary | ICD-10-CM | POA: Diagnosis not present

## 2021-08-08 ENCOUNTER — Other Ambulatory Visit: Payer: Self-pay | Admitting: Internal Medicine

## 2021-08-08 ENCOUNTER — Other Ambulatory Visit (HOSPITAL_COMMUNITY): Payer: Self-pay

## 2021-08-08 DIAGNOSIS — Z1231 Encounter for screening mammogram for malignant neoplasm of breast: Secondary | ICD-10-CM

## 2021-09-06 ENCOUNTER — Other Ambulatory Visit (HOSPITAL_COMMUNITY): Payer: Self-pay

## 2021-09-07 ENCOUNTER — Other Ambulatory Visit (HOSPITAL_COMMUNITY): Payer: Self-pay

## 2021-09-26 ENCOUNTER — Ambulatory Visit: Payer: BC Managed Care – PPO

## 2021-10-01 ENCOUNTER — Other Ambulatory Visit (HOSPITAL_COMMUNITY): Payer: Self-pay

## 2021-10-29 ENCOUNTER — Other Ambulatory Visit (HOSPITAL_COMMUNITY): Payer: Self-pay

## 2021-11-20 ENCOUNTER — Other Ambulatory Visit (HOSPITAL_COMMUNITY): Payer: Self-pay

## 2021-11-22 ENCOUNTER — Other Ambulatory Visit (HOSPITAL_COMMUNITY): Payer: Self-pay

## 2021-11-23 ENCOUNTER — Ambulatory Visit: Payer: BC Managed Care – PPO

## 2021-12-07 ENCOUNTER — Ambulatory Visit
Admission: RE | Admit: 2021-12-07 | Discharge: 2021-12-07 | Disposition: A | Discharge status: Home / Self Care | Payer: BC Managed Care – PPO | Source: Ambulatory Visit | Attending: Internal Medicine | Admitting: Internal Medicine

## 2021-12-07 DIAGNOSIS — Z1231 Encounter for screening mammogram for malignant neoplasm of breast: Secondary | ICD-10-CM | POA: Diagnosis not present

## 2021-12-24 ENCOUNTER — Other Ambulatory Visit (HOSPITAL_BASED_OUTPATIENT_CLINIC_OR_DEPARTMENT_OTHER): Payer: Self-pay

## 2021-12-24 ENCOUNTER — Other Ambulatory Visit (HOSPITAL_COMMUNITY): Payer: Self-pay

## 2022-01-07 DIAGNOSIS — E139 Other specified diabetes mellitus without complications: Secondary | ICD-10-CM | POA: Diagnosis not present

## 2022-01-07 DIAGNOSIS — E611 Iron deficiency: Secondary | ICD-10-CM | POA: Diagnosis not present

## 2022-01-07 DIAGNOSIS — Z1339 Encounter for screening examination for other mental health and behavioral disorders: Secondary | ICD-10-CM | POA: Diagnosis not present

## 2022-01-21 ENCOUNTER — Encounter: Payer: Self-pay | Admitting: Oncology

## 2022-01-21 ENCOUNTER — Other Ambulatory Visit (HOSPITAL_COMMUNITY): Payer: Self-pay

## 2022-02-21 ENCOUNTER — Other Ambulatory Visit (HOSPITAL_COMMUNITY): Payer: Self-pay

## 2022-02-21 MED ORDER — OZEMPIC (2 MG/DOSE) 8 MG/3ML ~~LOC~~ SOPN
2.0000 mg | PEN_INJECTOR | SUBCUTANEOUS | 3 refills | Status: AC
  Filled 2022-02-21: qty 9, 84d supply, fill #0
  Filled 2022-03-18 – 2022-03-19 (×2): qty 9, 84d supply, fill #1
  Filled 2022-05-13: qty 3, 28d supply, fill #1
  Filled 2022-06-10: qty 3, 28d supply, fill #2
  Filled 2022-07-08: qty 3, 28d supply, fill #3
  Filled 2022-08-05: qty 3, 28d supply, fill #4
  Filled 2022-09-04: qty 3, 28d supply, fill #5
  Filled 2022-09-30: qty 3, 28d supply, fill #6
  Filled 2022-10-28: qty 3, 28d supply, fill #7

## 2022-03-12 ENCOUNTER — Telehealth: Payer: Self-pay | Admitting: Gastroenterology

## 2022-03-12 NOTE — Telephone Encounter (Signed)
Okay to schedule direct colonoscopy with me.  Thank you.   Colonoscopy with Dr. Earlean Shawl 12/23/2011 for a family history of colon cancer in her grandmother revealed a 10 mm cecal polyp.  Her procedure was otherwise high quality.  Pathology revealed a sessile serrated adenoma.  Surveillance recommended in 5 years.

## 2022-03-12 NOTE — Telephone Encounter (Signed)
Good Morning Dr Tarri Glenn,  We have received a referral for patient to be seen in our office to have a colonoscopy.  Patient is requesting to establish care with you. Patient was previously with [facility]. Her last procedure was with Dr Earlean Shawl in 2018  I am sending records up for review. Please review and advise on scheduling.  Thank you

## 2022-03-15 NOTE — Telephone Encounter (Signed)
Called patient and left voicemail to call back and schedule.

## 2022-03-18 ENCOUNTER — Other Ambulatory Visit (HOSPITAL_COMMUNITY): Payer: Self-pay

## 2022-03-19 ENCOUNTER — Other Ambulatory Visit (HOSPITAL_COMMUNITY): Payer: Self-pay

## 2022-04-11 NOTE — Telephone Encounter (Signed)
Called patient to schedule left voicemail. 

## 2022-04-12 NOTE — Telephone Encounter (Signed)
Patient needing a Monday in January. Calendar not available.

## 2022-04-16 ENCOUNTER — Telehealth: Payer: Self-pay | Admitting: Gastroenterology

## 2022-04-16 ENCOUNTER — Encounter: Payer: Self-pay | Admitting: Gastroenterology

## 2022-04-17 DIAGNOSIS — Z23 Encounter for immunization: Secondary | ICD-10-CM | POA: Diagnosis not present

## 2022-04-17 DIAGNOSIS — E1065 Type 1 diabetes mellitus with hyperglycemia: Secondary | ICD-10-CM | POA: Diagnosis not present

## 2022-05-06 DIAGNOSIS — E785 Hyperlipidemia, unspecified: Secondary | ICD-10-CM | POA: Diagnosis not present

## 2022-05-06 DIAGNOSIS — E039 Hypothyroidism, unspecified: Secondary | ICD-10-CM | POA: Diagnosis not present

## 2022-05-06 DIAGNOSIS — E139 Other specified diabetes mellitus without complications: Secondary | ICD-10-CM | POA: Diagnosis not present

## 2022-05-10 DIAGNOSIS — E139 Other specified diabetes mellitus without complications: Secondary | ICD-10-CM | POA: Diagnosis not present

## 2022-05-10 DIAGNOSIS — E039 Hypothyroidism, unspecified: Secondary | ICD-10-CM | POA: Diagnosis not present

## 2022-05-10 DIAGNOSIS — E785 Hyperlipidemia, unspecified: Secondary | ICD-10-CM | POA: Diagnosis not present

## 2022-05-13 ENCOUNTER — Other Ambulatory Visit (HOSPITAL_COMMUNITY): Payer: Self-pay

## 2022-05-16 ENCOUNTER — Other Ambulatory Visit (HOSPITAL_COMMUNITY): Payer: Self-pay

## 2022-06-10 ENCOUNTER — Other Ambulatory Visit (HOSPITAL_COMMUNITY): Payer: Self-pay

## 2022-06-21 ENCOUNTER — Ambulatory Visit (AMBULATORY_SURGERY_CENTER): Payer: BC Managed Care – PPO

## 2022-06-21 VITALS — Ht 67.0 in | Wt 168.0 lb

## 2022-06-21 DIAGNOSIS — Z86010 Personal history of colonic polyps: Secondary | ICD-10-CM

## 2022-06-21 DIAGNOSIS — Z800 Family history of malignant neoplasm of digestive organs: Secondary | ICD-10-CM

## 2022-06-21 MED ORDER — ONDANSETRON HCL 4 MG PO TABS: 4.0000 mg | ORAL_TABLET | ORAL | 0 refills | Status: AC

## 2022-06-21 MED ORDER — NA SULFATE-K SULFATE-MG SULF 17.5-3.13-1.6 GM/177ML PO SOLN: 1.0000 | Freq: Once | ORAL | 0 refills | Status: AC

## 2022-06-21 NOTE — Progress Notes (Signed)

## 2022-06-27 ENCOUNTER — Encounter: Payer: Self-pay | Admitting: Gastroenterology

## 2022-07-04 ENCOUNTER — Telehealth: Payer: Self-pay | Admitting: Gastroenterology

## 2022-07-04 NOTE — Telephone Encounter (Signed)
Good afternoon Dr. Tarri Glenn  The following patient is requesting a transfer request to Dr. Lorenso Courier to see if she can find a provider that can accommodate her work schedule and right now, there's no appointments within your schedule to fit her. Would you be willing to release this patient? Please advise. Thank you.

## 2022-07-08 ENCOUNTER — Encounter: Payer: BC Managed Care – PPO | Admitting: Gastroenterology

## 2022-07-08 ENCOUNTER — Other Ambulatory Visit (HOSPITAL_COMMUNITY): Payer: Self-pay

## 2022-07-08 NOTE — Telephone Encounter (Signed)
Good afternoon  Dr. Lorenso Courier,  Would you be will to accept this transfer request? Please advise to schedule. Thank you.

## 2022-07-09 ENCOUNTER — Encounter: Payer: Self-pay | Admitting: Internal Medicine

## 2022-07-09 NOTE — Telephone Encounter (Signed)
PT scheduled for direct colonoscopy on 4/22 at 830am

## 2022-07-10 DIAGNOSIS — E039 Hypothyroidism, unspecified: Secondary | ICD-10-CM | POA: Diagnosis not present

## 2022-07-10 DIAGNOSIS — E139 Other specified diabetes mellitus without complications: Secondary | ICD-10-CM | POA: Diagnosis not present

## 2022-07-10 DIAGNOSIS — E785 Hyperlipidemia, unspecified: Secondary | ICD-10-CM | POA: Diagnosis not present

## 2022-07-22 DIAGNOSIS — Z01419 Encounter for gynecological examination (general) (routine) without abnormal findings: Secondary | ICD-10-CM | POA: Diagnosis not present

## 2022-07-22 DIAGNOSIS — Z6828 Body mass index (BMI) 28.0-28.9, adult: Secondary | ICD-10-CM | POA: Diagnosis not present

## 2022-07-22 DIAGNOSIS — R6882 Decreased libido: Secondary | ICD-10-CM | POA: Diagnosis not present

## 2022-07-23 DIAGNOSIS — E139 Other specified diabetes mellitus without complications: Secondary | ICD-10-CM | POA: Diagnosis not present

## 2022-07-23 DIAGNOSIS — R030 Elevated blood-pressure reading, without diagnosis of hypertension: Secondary | ICD-10-CM | POA: Diagnosis not present

## 2022-07-23 DIAGNOSIS — Z794 Long term (current) use of insulin: Secondary | ICD-10-CM | POA: Diagnosis not present

## 2022-07-23 DIAGNOSIS — E1065 Type 1 diabetes mellitus with hyperglycemia: Secondary | ICD-10-CM | POA: Diagnosis not present

## 2022-08-05 ENCOUNTER — Encounter: Payer: BC Managed Care – PPO | Admitting: Gastroenterology

## 2022-08-05 ENCOUNTER — Other Ambulatory Visit (HOSPITAL_COMMUNITY): Payer: Self-pay

## 2022-08-05 DIAGNOSIS — R6882 Decreased libido: Secondary | ICD-10-CM | POA: Diagnosis not present

## 2022-08-16 ENCOUNTER — Other Ambulatory Visit: Payer: Self-pay

## 2022-08-16 ENCOUNTER — Ambulatory Visit (AMBULATORY_SURGERY_CENTER): Payer: BC Managed Care – PPO

## 2022-08-16 VITALS — Ht 67.0 in | Wt 169.0 lb

## 2022-08-16 DIAGNOSIS — Z86010 Personal history of colonic polyps: Secondary | ICD-10-CM

## 2022-08-16 NOTE — Progress Notes (Signed)
Denies allergies to eggs or soy products. Denies complication of anesthesia or sedation. Denies use of weight loss medication. Denies use of O2.    Patient had to reschedule a previous scheduled procedure so she already had Suprep. Patient verbalizes understanding of instructions.

## 2022-08-19 ENCOUNTER — Encounter: Payer: Self-pay | Admitting: Internal Medicine

## 2022-09-02 DIAGNOSIS — R6882 Decreased libido: Secondary | ICD-10-CM | POA: Diagnosis not present

## 2022-09-04 ENCOUNTER — Other Ambulatory Visit (HOSPITAL_COMMUNITY): Payer: Self-pay

## 2022-09-11 ENCOUNTER — Telehealth: Payer: Self-pay | Admitting: Internal Medicine

## 2022-09-11 NOTE — Telephone Encounter (Signed)
Inbound call from patient, states she has a procedure on Monday 4/22. Patient states she is feeling feverish, and congested. She has not taken her temperature yet. States she would like to speak with a nurse in regards to being able to continue her procedure.

## 2022-09-11 NOTE — Telephone Encounter (Signed)
Pts call returned pt r/s for 11/04/22 @ 830 AM new prep instructions will be sent out to the patient via mychart and by mail

## 2022-09-16 ENCOUNTER — Encounter: Payer: BC Managed Care – PPO | Admitting: Internal Medicine

## 2022-09-17 DIAGNOSIS — E119 Type 2 diabetes mellitus without complications: Secondary | ICD-10-CM | POA: Diagnosis not present

## 2022-09-17 LAB — HM DIABETES EYE EXAM

## 2022-09-23 DIAGNOSIS — E611 Iron deficiency: Secondary | ICD-10-CM | POA: Diagnosis not present

## 2022-09-23 DIAGNOSIS — E538 Deficiency of other specified B group vitamins: Secondary | ICD-10-CM | POA: Diagnosis not present

## 2022-09-25 DIAGNOSIS — E039 Hypothyroidism, unspecified: Secondary | ICD-10-CM | POA: Diagnosis not present

## 2022-09-25 DIAGNOSIS — E611 Iron deficiency: Secondary | ICD-10-CM | POA: Diagnosis not present

## 2022-09-25 DIAGNOSIS — R7989 Other specified abnormal findings of blood chemistry: Secondary | ICD-10-CM | POA: Diagnosis not present

## 2022-09-30 ENCOUNTER — Other Ambulatory Visit (HOSPITAL_COMMUNITY): Payer: Self-pay

## 2022-09-30 DIAGNOSIS — E039 Hypothyroidism, unspecified: Secondary | ICD-10-CM | POA: Diagnosis not present

## 2022-09-30 DIAGNOSIS — Z1331 Encounter for screening for depression: Secondary | ICD-10-CM | POA: Diagnosis not present

## 2022-09-30 DIAGNOSIS — Z794 Long term (current) use of insulin: Secondary | ICD-10-CM | POA: Diagnosis not present

## 2022-09-30 DIAGNOSIS — Z1339 Encounter for screening examination for other mental health and behavioral disorders: Secondary | ICD-10-CM | POA: Diagnosis not present

## 2022-09-30 DIAGNOSIS — E139 Other specified diabetes mellitus without complications: Secondary | ICD-10-CM | POA: Diagnosis not present

## 2022-09-30 DIAGNOSIS — R82998 Other abnormal findings in urine: Secondary | ICD-10-CM | POA: Diagnosis not present

## 2022-09-30 DIAGNOSIS — Z0000 Encounter for general adult medical examination without abnormal findings: Secondary | ICD-10-CM | POA: Diagnosis not present

## 2022-09-30 DIAGNOSIS — C50919 Malignant neoplasm of unspecified site of unspecified female breast: Secondary | ICD-10-CM | POA: Diagnosis not present

## 2022-10-14 DIAGNOSIS — R6882 Decreased libido: Secondary | ICD-10-CM | POA: Diagnosis not present

## 2022-10-28 ENCOUNTER — Other Ambulatory Visit (HOSPITAL_COMMUNITY): Payer: Self-pay

## 2022-10-28 ENCOUNTER — Encounter: Payer: Self-pay | Admitting: Oncology

## 2022-10-29 ENCOUNTER — Telehealth: Payer: Self-pay | Admitting: Internal Medicine

## 2022-10-29 ENCOUNTER — Other Ambulatory Visit (HOSPITAL_COMMUNITY): Payer: Self-pay

## 2022-10-29 ENCOUNTER — Encounter: Payer: Self-pay | Admitting: Oncology

## 2022-10-29 NOTE — Telephone Encounter (Signed)
Patient called wishing to speak about coverage for her procedure on 6/10. Insurance has been update. Please advise, thank you.

## 2022-10-30 DIAGNOSIS — R69 Illness, unspecified: Secondary | ICD-10-CM | POA: Diagnosis not present

## 2022-11-04 ENCOUNTER — Other Ambulatory Visit: Payer: Self-pay | Admitting: Internal Medicine

## 2022-11-04 ENCOUNTER — Ambulatory Visit (AMBULATORY_SURGERY_CENTER): Payer: Medicare HMO | Admitting: Internal Medicine

## 2022-11-04 ENCOUNTER — Encounter: Payer: Self-pay | Admitting: Internal Medicine

## 2022-11-04 ENCOUNTER — Other Ambulatory Visit (HOSPITAL_COMMUNITY): Payer: Self-pay

## 2022-11-04 VITALS — BP 108/66 | HR 69 | Temp 97.8°F | Resp 13 | Ht 62.0 in | Wt 169.0 lb

## 2022-11-04 DIAGNOSIS — E119 Type 2 diabetes mellitus without complications: Secondary | ICD-10-CM | POA: Diagnosis not present

## 2022-11-04 DIAGNOSIS — E039 Hypothyroidism, unspecified: Secondary | ICD-10-CM | POA: Diagnosis not present

## 2022-11-04 DIAGNOSIS — F419 Anxiety disorder, unspecified: Secondary | ICD-10-CM | POA: Diagnosis not present

## 2022-11-04 DIAGNOSIS — Z09 Encounter for follow-up examination after completed treatment for conditions other than malignant neoplasm: Secondary | ICD-10-CM | POA: Diagnosis not present

## 2022-11-04 DIAGNOSIS — D123 Benign neoplasm of transverse colon: Secondary | ICD-10-CM | POA: Diagnosis not present

## 2022-11-04 DIAGNOSIS — Z800 Family history of malignant neoplasm of digestive organs: Secondary | ICD-10-CM

## 2022-11-04 DIAGNOSIS — Z86010 Personal history of colonic polyps: Secondary | ICD-10-CM

## 2022-11-04 DIAGNOSIS — Z1231 Encounter for screening mammogram for malignant neoplasm of breast: Secondary | ICD-10-CM

## 2022-11-04 DIAGNOSIS — K635 Polyp of colon: Secondary | ICD-10-CM | POA: Diagnosis not present

## 2022-11-04 MED ORDER — SODIUM CHLORIDE 0.9 % IV SOLN: 500.0000 mL | INTRAVENOUS | Status: DC

## 2022-11-04 NOTE — Op Note (Signed)
[facility] Patient Name: Lori Vincent Procedure Date: 11/04/2022 8:45 AM MRN: [id number] Endoscopist: Particia Lather , , [id number] Age: 67 Referring MD:  Date of Birth: May 18, 1956 Gender: Female Account #: [id number] Procedure:                Colonoscopy Indications:              High risk colon cancer surveillance: Personal                            history of colonic polyps Medicines:                Monitored Anesthesia Care Procedure:                Pre-Anesthesia Assessment:                           - Prior to the procedure, a History and Physical                            was performed, and patient medications and                            allergies were reviewed. The patient's tolerance of                            previous anesthesia was also reviewed. The risks                            and benefits of the procedure and the sedation                            options and risks were discussed with the patient.                            All questions were answered, and informed consent                            was obtained. Prior Anticoagulants: The patient has                            taken no anticoagulant or antiplatelet agents. ASA                            Grade Assessment: II - A patient with mild systemic                            disease. After reviewing the risks and benefits,                            the patient was deemed in satisfactory condition to                            undergo the procedure.  After obtaining informed consent, the colonoscope                            was passed under direct vision. Throughout the                            procedure, the patient's blood pressure, pulse, and                            oxygen saturations were monitored continuously. The                            CF [id number] #[id number] was introduced through the anus                            and advanced to the the  terminal ileum. The                            colonoscopy was performed without difficulty. The                            patient tolerated the procedure well. The quality                            of the bowel preparation was excellent. The                            terminal ileum, ileocecal valve, appendiceal                            orifice, and rectum were photographed. Scope In: 8:50:07 AM Scope Out: 9:13:14 AM Scope Withdrawal Time: 0 hours 15 minutes 51 seconds  Total Procedure Duration: 0 hours 23 minutes 7 seconds  Findings:                 The terminal ileum appeared normal.                           Six sessile polyps were found in the transverse                            colon, ascending colon and cecum. The polyps were 3                            to 6 mm in size. These polyps were removed with a                            cold snare. Resection and retrieval were complete.                           Non-bleeding internal hemorrhoids were found during                            retroflexion. Complications:  No immediate complications. Estimated Blood Loss:     Estimated blood loss was minimal. Impression:               - The examined portion of the ileum was normal.                           - Six 3 to 6 mm polyps in the transverse colon, in                            the ascending colon and in the cecum, removed with                            a cold snare. Resected and retrieved.                           - Non-bleeding internal hemorrhoids. Recommendation:           - Discharge patient to home (with escort).                           - Await pathology results.                           - The findings and recommendations were discussed                            with the patient. Dr Particia Lather "Alan Ripper" Leonides Schanz,  11/04/2022 9:16:52 AM

## 2022-11-04 NOTE — Progress Notes (Signed)
GASTROENTEROLOGY PROCEDURE H&P NOTE   Primary Care Physician: Cleatis Polka., MD    Reason for Procedure:   History of colon polyps  Plan:    Colonoscopy  Patient is appropriate for endoscopic procedure(s) in the ambulatory ([facility]) setting.  The nature of the procedure, as well as the risks, benefits, and alternatives were carefully and thoroughly reviewed with the patient. Ample time for discussion and questions allowed. The patient understood, was satisfied, and agreed to proceed.     HPI: Lori Vincent is a 67 y.o. female who presents for colonoscopy for history of colon polyps. Denies blood in stools, changes in bowel habits, or unintentional weight loss. Has family history of colon cancer in grandmother and uncle. Last colonoscopy in 2018 potentially showed one polyp and she was recommended for a 5 years follow up per patient.  Past Medical History:  Diagnosis Date   Allergy    Anemia due to chemotherapy    Anxiety    Arthritis    knees and feet   Breast cancer (HCC) 06/03/2013   left, 6:00 o'clock   Diabetes mellitus without complication (HCC)    Family history of colon cancer    History of blood transfusion 12/2013   [facility] - 2 units transfused - during chemo   History of radiation therapy 01/18/14- 03/02/14   left breast 4680 cGy 26 sessions, boost of 1000 cGy 5 sessions   Hypokalemia    during with chemo 08/2013   Hypothyroidism    Neuromuscular disorder (HCC)    Neuropathy    bottom of feet, mild   Personal history of chemotherapy    Personal history of radiation therapy     Past Surgical History:  Procedure Laterality Date   BREAST BIOPSY  1/14   lt   BREAST LUMPECTOMY Left    2015   BREAST LUMPECTOMY WITH NEEDLE LOCALIZATION AND AXILLARY SENTINEL LYMPH NODE BX Left 07/26/2013   Procedure: BREAST LUMPECTOMY WITH NEEDLE LOCALIZATION AND AXILLARY SENTINEL LYMPH NODE BX;  Surgeon: Kandis Cocking, MD;  Location: [facility];  Service: General;   Laterality: Left;   CESAREAN SECTION  04/1999   CHOLECYSTECTOMY N/A 05/16/2017   Procedure: LAPAROSCOPIC CHOLECYSTECTOMY WITH INTRAOPERATIVE CHOLANGIOGRAM;  Surgeon: Ovidio Kin, MD;  Location: [facility];  Service: General;  Laterality: N/A;   COLONOSCOPY  2013   DIAGNOSTIC LAPAROSCOPY     laser endometriosis   DILATION AND CURETTAGE OF UTERUS     DILITATION & CURRETTAGE/HYSTROSCOPY WITH HYDROTHERMAL ABLATION N/A 09/02/2014   Procedure: DILATATION & CURETTAGE/HYSTEROSCOPY WITH HYDROTHERMAL ABLATION;  Surgeon: Marcelle Overlie, MD;  Location: [facility];  Service: Gynecology;  Laterality: N/A;   LAPAROSCOPIC VAGINAL HYSTERECTOMY WITH SALPINGO OOPHORECTOMY Bilateral 09/30/2016   Procedure: LAPAROSCOPIC ASSISTED VAGINAL HYSTERECTOMY WITH BILATERAL SALPINGO OOPHORECTOMY;  Surgeon: Marcelle Overlie, MD;  Location: [facility];  Service: Gynecology;  Laterality: Bilateral;   MOLE REMOVAL Right 07/26/2013   Procedure: MOLE REMOVAL UNDER RIGHT BREAST;  Surgeon: Kandis Cocking, MD;  Location: [facility];  Service: General;  Laterality: Right;   PORTACATH PLACEMENT Right 07/26/2013   Procedure: INSERTION PORT-A-CATH;  Surgeon: Kandis Cocking, MD;  Location: [facility];  Service: General;  Laterality: Right;   TONSILLECTOMY  1972   WISDOM TOOTH EXTRACTION      Prior to Admission medications   Medication Sig Start Date End Date Taking? Authorizing Provider  citalopram (CELEXA) 10 MG tablet Take 10 mg by mouth daily.   Yes [provider]  Continuous Blood Gluc Sensor (DEXCOM G7 SENSOR) MISC CHANGE EVERY 10 DAYS TO MONITOR BLOOD GLUCOSE CONTINUOUSLY 05/27/22  Yes [provider]  levothyroxine (SYNTHROID) 125 MCG tablet Take 125 mcg by mouth daily before breakfast. Monday thru Saturday daily 03/06/17  Yes [provider]  Na Sulfate-K Sulfate-Mg Sulf (SUPREP BOWEL PREP KIT) 17.5-3.13-1.6 GM/177ML SOLN Take by mouth.   Yes [provider]  NOVOLOG FLEXPEN 100  UNIT/ML FlexPen Inject into the skin. 05/19/22  Yes [provider]  ondansetron (ZOFRAN) 4 MG tablet Take 1 tablet (4 mg total) by mouth as directed. Take one Zofran 4 mg tablet 30-60 minutes before each colonoscopy prep dose 06/21/22  Yes Beavers, Cala Bradford, MD  [facility] VERIO test strip SMARTSIG:Via Meter 1-2 Times Daily 03/22/22  Yes [provider]  rosuvastatin (CRESTOR) 10 MG tablet rosuvastatin 10 mg tablet  TAKE 1 TABLET BY MOUTH EVERY DAY AT BEDTIME   Yes [provider]  TRESIBA FLEXTOUCH 100 UNIT/ML FlexTouch Pen SMARTSIG:20 Unit(s) SUB-Q Daily 03/25/22  Yes [provider]  iron polysaccharides (NIFEREX) 150 MG capsule Take 1 capsule by mouth 2 (two) times daily.    [provider]  Semaglutide, 2 MG/DOSE, (OZEMPIC, 2 MG/DOSE,) 8 MG/3ML SOPN Inject 2 mg into the skin once a week. 02/21/22       Current Outpatient Medications  Medication Sig Dispense Refill   citalopram (CELEXA) 10 MG tablet Take 10 mg by mouth daily.     Continuous Blood Gluc Sensor (DEXCOM G7 SENSOR) MISC CHANGE EVERY 10 DAYS TO MONITOR BLOOD GLUCOSE CONTINUOUSLY     levothyroxine (SYNTHROID) 125 MCG tablet Take 125 mcg by mouth daily before breakfast. Monday thru Saturday daily  1   Na Sulfate-K Sulfate-Mg Sulf (SUPREP BOWEL PREP KIT) 17.5-3.13-1.6 GM/177ML SOLN Take by mouth.     NOVOLOG FLEXPEN 100 UNIT/ML FlexPen Inject into the skin.     ondansetron (ZOFRAN) 4 MG tablet Take 1 tablet (4 mg total) by mouth as directed. Take one Zofran 4 mg tablet 30-60 minutes before each colonoscopy prep dose 2 tablet 0   ONETOUCH VERIO test strip SMARTSIG:Via Meter 1-2 Times Daily     rosuvastatin (CRESTOR) 10 MG tablet rosuvastatin 10 mg tablet  TAKE 1 TABLET BY MOUTH EVERY DAY AT BEDTIME     TRESIBA FLEXTOUCH 100 UNIT/ML FlexTouch Pen SMARTSIG:20 Unit(s) SUB-Q Daily     iron polysaccharides (NIFEREX) 150 MG capsule Take 1 capsule by mouth 2 (two) times daily.     Semaglutide, 2  MG/DOSE, (OZEMPIC, 2 MG/DOSE,) 8 MG/3ML SOPN Inject 2 mg into the skin once a week. 9 mL 3   Current Facility-Administered Medications  Medication Dose Route Frequency Provider Last Rate Last Admin   0.9 %  sodium chloride infusion  500 mL Intravenous Continuous Imogene Burn, MD        Allergies as of 11/04/2022 - Review Complete 11/04/2022  Allergen Reaction Noted   Compazine [prochlorperazine] Other (See Comments) 11/04/2013   Metformin and related Diarrhea 05/16/2022    Family History  Problem Relation Age of Onset   Stroke Mother    Diabetes Father    Diabetes type I Father    Healthy Sister    Healthy Brother    Pulmonary embolism Maternal Uncle    Colon cancer Paternal Uncle 68   Heart Problems Paternal Uncle    Colon cancer Maternal Grandmother 82   Stomach cancer Maternal Grandfather 86       pipe smoker  Diabetes type I Paternal Grandmother    Lung cancer Paternal Grandfather    Breast cancer Neg Hx    Colon polyps Neg Hx    Esophageal cancer Neg Hx    Rectal cancer Neg Hx     Social History   Socioeconomic History   Marital status: Widowed    Spouse name: Not on file   Number of children: 1   Years of education: Not on file   Highest education level: Not on file  Occupational History   Not on file  Tobacco Use   Smoking status: Never   Smokeless tobacco: Never  Vaping Use   Vaping Use: Not on file  Substance and Sexual Activity   Alcohol use: Yes    Comment: 2 glasses per week of red wine   Drug use: No   Sexual activity: Not Currently    Birth control/protection: Post-menopausal    Comment: menarche age 40, 1st live birth 47, P61, HRT x 1 month  Other Topics Concern   Not on file  Social History Narrative   Not on file   Social Determinants of Health   Financial Resource Strain: Not on file  Food Insecurity: Not on file  Transportation Needs: Not on file  Physical Activity: Not on file  Stress: Not on file  Social Connections: Not on  file  Intimate Partner Violence: Not on file    Physical Exam: Vital signs in last 24 hours: BP 100/70   Pulse 86   Temp 97.8 F (36.6 C) (Skin)   Ht 5\' 2"  (1.575 m)   Wt 169 lb (76.7 kg)   SpO2 97%   BMI 30.91 kg/m  GEN: NAD EYE: Sclerae anicteric ENT: MMM CV: Non-tachycardic Pulm: No increased work of breathing GI: Soft, NT/ND NEURO:  Alert & Oriented   Eulah Pont, MD [facility]  11/04/2022 8:34 AM

## 2022-11-04 NOTE — Progress Notes (Signed)
Patient states there have been no changes to medical or surgical history since time of pre-visit. 

## 2022-11-04 NOTE — Patient Instructions (Signed)
YOU HAD AN ENDOSCOPIC PROCEDURE TODAY AT THE [facility]:   Refer to the procedure report that was given to you for any specific questions about what was found during the examination.  If the procedure report does not answer your questions, please call your gastroenterologist to clarify.  If you requested that your care partner not be given the details of your procedure findings, then the procedure report has been included in a sealed envelope for you to review at your convenience later.  YOU SHOULD EXPECT: Some feelings of bloating in the abdomen. Passage of more gas than usual.  Walking can help get rid of the air that was put into your GI tract during the procedure and reduce the bloating. If you had a lower endoscopy (such as a colonoscopy or flexible sigmoidoscopy) you may notice spotting of blood in your stool or on the toilet paper. If you underwent a bowel prep for your procedure, you may not have a normal bowel movement for a few days.  Please Note:  You might notice some irritation and congestion in your nose or some drainage.  This is from the oxygen used during your procedure.  There is no need for concern and it should clear up in a day or so.  SYMPTOMS TO REPORT IMMEDIATELY:  Following lower endoscopy (colonoscopy or flexible sigmoidoscopy):  Excessive amounts of blood in the stool  Significant tenderness or worsening of abdominal pains  Swelling of the abdomen that is new, acute  Fever of 100F or higher  For urgent or emergent issues, a gastroenterologist can be reached at any hour by calling [phone]. Do not use MyChart messaging for urgent concerns.    DIET:  We do recommend a small meal at first, but then you may proceed to your regular diet.  Drink plenty of fluids but you should avoid alcoholic beverages for 24 hours.  ACTIVITY:  You should plan to take it easy for the rest of today and you should NOT DRIVE or use heavy machinery until tomorrow (because of  the sedation medicines used during the test).    FOLLOW UP: Our staff will call the number listed on your records the next business day following your procedure.  We will call around 7:15- 8:00 am to check on you and address any questions or concerns that you may have regarding the information given to you following your procedure. If we do not reach you, we will leave a message.     If any biopsies were taken you will be contacted by phone or by letter within the next 1-3 weeks.  Please call us at [phone] if you have not heard about the biopsies in 3 weeks.    SIGNATURES/CONFIDENTIALITY: You and/or your care partner have signed paperwork which will be entered into your electronic medical record.  These signatures attest to the fact that that the information above on your After Visit Summary has been reviewed and is understood.  Full responsibility of the confidentiality of this discharge information lies with you and/or your care-partner.  

## 2022-11-04 NOTE — Progress Notes (Signed)
Called to room to assist during endoscopic procedure.  Patient ID and intended procedure confirmed with present staff. Received instructions for my participation in the procedure from the performing physician.  

## 2022-11-04 NOTE — Progress Notes (Signed)
Pt resting comfortably. VSS. Airway intact. SBAR complete to RN. All questions answered.   

## 2022-11-05 ENCOUNTER — Telehealth: Payer: Self-pay

## 2022-11-05 NOTE — Telephone Encounter (Signed)
  Follow up Call-     11/04/2022    7:41 AM  Call back number  Post procedure Call Back phone  # [phone]  Permission to leave phone message Yes     Patient questions:  Do you have a fever, pain , or abdominal swelling? No. Pain Score  0 *  Have you tolerated food without any problems? Yes.    Have you been able to return to your normal activities? Yes.    Do you have any questions about your discharge instructions: Diet   No. Medications  No. Follow up visit  No.  Do you have questions or concerns about your Care? No.  Actions: * If pain score is 4 or above: No action needed, pain <4.

## 2022-11-06 ENCOUNTER — Ambulatory Visit: Payer: Medicare HMO

## 2022-11-07 ENCOUNTER — Ambulatory Visit: Payer: Medicare HMO

## 2022-11-07 ENCOUNTER — Encounter: Payer: Self-pay | Admitting: Internal Medicine

## 2022-11-08 ENCOUNTER — Ambulatory Visit: Payer: Medicare HMO

## 2022-11-08 ENCOUNTER — Ambulatory Visit
Admission: RE | Admit: 2022-11-08 | Discharge: 2022-11-08 | Disposition: A | Discharge status: Home / Self Care | Payer: Medicare HMO | Source: Ambulatory Visit | Attending: Internal Medicine | Admitting: Internal Medicine

## 2022-11-08 DIAGNOSIS — E6609 Other obesity due to excess calories: Secondary | ICD-10-CM | POA: Diagnosis not present

## 2022-11-08 DIAGNOSIS — E139 Other specified diabetes mellitus without complications: Secondary | ICD-10-CM | POA: Diagnosis not present

## 2022-11-08 DIAGNOSIS — Z1231 Encounter for screening mammogram for malignant neoplasm of breast: Secondary | ICD-10-CM

## 2022-11-08 DIAGNOSIS — C50919 Malignant neoplasm of unspecified site of unspecified female breast: Secondary | ICD-10-CM | POA: Diagnosis not present

## 2022-11-08 DIAGNOSIS — E039 Hypothyroidism, unspecified: Secondary | ICD-10-CM | POA: Diagnosis not present

## 2022-11-08 DIAGNOSIS — E785 Hyperlipidemia, unspecified: Secondary | ICD-10-CM | POA: Diagnosis not present

## 2022-11-29 DIAGNOSIS — R69 Illness, unspecified: Secondary | ICD-10-CM | POA: Diagnosis not present

## 2022-12-30 DIAGNOSIS — R69 Illness, unspecified: Secondary | ICD-10-CM | POA: Diagnosis not present

## 2023-01-30 DIAGNOSIS — R69 Illness, unspecified: Secondary | ICD-10-CM | POA: Diagnosis not present

## 2023-03-01 DIAGNOSIS — R69 Illness, unspecified: Secondary | ICD-10-CM | POA: Diagnosis not present

## 2023-04-11 DIAGNOSIS — E669 Obesity, unspecified: Secondary | ICD-10-CM | POA: Diagnosis not present

## 2023-04-11 DIAGNOSIS — Z23 Encounter for immunization: Secondary | ICD-10-CM | POA: Diagnosis not present

## 2023-04-11 DIAGNOSIS — E538 Deficiency of other specified B group vitamins: Secondary | ICD-10-CM | POA: Diagnosis not present

## 2023-04-11 DIAGNOSIS — E039 Hypothyroidism, unspecified: Secondary | ICD-10-CM | POA: Diagnosis not present

## 2023-04-11 DIAGNOSIS — Z794 Long term (current) use of insulin: Secondary | ICD-10-CM | POA: Diagnosis not present

## 2023-04-11 DIAGNOSIS — G622 Polyneuropathy due to other toxic agents: Secondary | ICD-10-CM | POA: Diagnosis not present

## 2023-04-11 DIAGNOSIS — E611 Iron deficiency: Secondary | ICD-10-CM | POA: Diagnosis not present

## 2023-04-11 DIAGNOSIS — E139 Other specified diabetes mellitus without complications: Secondary | ICD-10-CM | POA: Diagnosis not present

## 2023-04-11 DIAGNOSIS — F419 Anxiety disorder, unspecified: Secondary | ICD-10-CM | POA: Diagnosis not present

## 2023-04-11 DIAGNOSIS — C50919 Malignant neoplasm of unspecified site of unspecified female breast: Secondary | ICD-10-CM | POA: Diagnosis not present

## 2023-05-08 DIAGNOSIS — E039 Hypothyroidism, unspecified: Secondary | ICD-10-CM | POA: Diagnosis not present

## 2023-05-08 DIAGNOSIS — E139 Other specified diabetes mellitus without complications: Secondary | ICD-10-CM | POA: Diagnosis not present

## 2023-05-08 DIAGNOSIS — E785 Hyperlipidemia, unspecified: Secondary | ICD-10-CM | POA: Diagnosis not present

## 2023-05-16 DIAGNOSIS — E139 Other specified diabetes mellitus without complications: Secondary | ICD-10-CM | POA: Diagnosis not present

## 2023-05-16 DIAGNOSIS — E6609 Other obesity due to excess calories: Secondary | ICD-10-CM | POA: Diagnosis not present

## 2023-05-16 DIAGNOSIS — E039 Hypothyroidism, unspecified: Secondary | ICD-10-CM | POA: Diagnosis not present

## 2023-05-16 DIAGNOSIS — C50919 Malignant neoplasm of unspecified site of unspecified female breast: Secondary | ICD-10-CM | POA: Diagnosis not present

## 2023-05-16 DIAGNOSIS — E785 Hyperlipidemia, unspecified: Secondary | ICD-10-CM | POA: Diagnosis not present

## 2023-07-18 DIAGNOSIS — E039 Hypothyroidism, unspecified: Secondary | ICD-10-CM | POA: Diagnosis not present

## 2023-10-17 DIAGNOSIS — E538 Deficiency of other specified B group vitamins: Secondary | ICD-10-CM | POA: Diagnosis not present

## 2023-10-17 DIAGNOSIS — E039 Hypothyroidism, unspecified: Secondary | ICD-10-CM | POA: Diagnosis not present

## 2023-10-17 DIAGNOSIS — E611 Iron deficiency: Secondary | ICD-10-CM | POA: Diagnosis not present

## 2023-10-17 DIAGNOSIS — E139 Other specified diabetes mellitus without complications: Secondary | ICD-10-CM | POA: Diagnosis not present

## 2023-10-17 DIAGNOSIS — Z794 Long term (current) use of insulin: Secondary | ICD-10-CM | POA: Diagnosis not present

## 2023-10-17 DIAGNOSIS — E1065 Type 1 diabetes mellitus with hyperglycemia: Secondary | ICD-10-CM | POA: Diagnosis not present

## 2023-10-22 ENCOUNTER — Other Ambulatory Visit: Payer: Self-pay | Admitting: Obstetrics and Gynecology

## 2023-10-22 DIAGNOSIS — Z1231 Encounter for screening mammogram for malignant neoplasm of breast: Secondary | ICD-10-CM

## 2023-11-10 ENCOUNTER — Encounter: Payer: Self-pay | Admitting: Oncology

## 2023-11-14 ENCOUNTER — Ambulatory Visit

## 2023-11-17 ENCOUNTER — Ambulatory Visit
Admission: RE | Admit: 2023-11-17 | Discharge: 2023-11-17 | Disposition: A | Discharge status: Home / Self Care | Source: Ambulatory Visit | Attending: Obstetrics and Gynecology | Admitting: Obstetrics and Gynecology

## 2023-11-17 ENCOUNTER — Ambulatory Visit

## 2023-11-17 ENCOUNTER — Encounter: Payer: Self-pay | Admitting: Oncology

## 2023-11-17 DIAGNOSIS — Z23 Encounter for immunization: Secondary | ICD-10-CM | POA: Diagnosis not present

## 2023-11-17 DIAGNOSIS — E139 Other specified diabetes mellitus without complications: Secondary | ICD-10-CM | POA: Diagnosis not present

## 2023-11-17 DIAGNOSIS — E538 Deficiency of other specified B group vitamins: Secondary | ICD-10-CM | POA: Diagnosis not present

## 2023-11-17 DIAGNOSIS — Z860101 Personal history of adenomatous and serrated colon polyps: Secondary | ICD-10-CM | POA: Diagnosis not present

## 2023-11-17 DIAGNOSIS — Z1231 Encounter for screening mammogram for malignant neoplasm of breast: Secondary | ICD-10-CM | POA: Diagnosis not present

## 2023-11-17 DIAGNOSIS — Z794 Long term (current) use of insulin: Secondary | ICD-10-CM | POA: Diagnosis not present

## 2023-11-17 DIAGNOSIS — E1065 Type 1 diabetes mellitus with hyperglycemia: Secondary | ICD-10-CM | POA: Diagnosis not present

## 2023-11-17 DIAGNOSIS — E785 Hyperlipidemia, unspecified: Secondary | ICD-10-CM | POA: Diagnosis not present

## 2023-11-17 DIAGNOSIS — G622 Polyneuropathy due to other toxic agents: Secondary | ICD-10-CM | POA: Diagnosis not present

## 2023-11-17 DIAGNOSIS — F419 Anxiety disorder, unspecified: Secondary | ICD-10-CM | POA: Diagnosis not present

## 2023-11-17 DIAGNOSIS — Z0000 Encounter for general adult medical examination without abnormal findings: Secondary | ICD-10-CM | POA: Diagnosis not present

## 2023-11-17 DIAGNOSIS — E039 Hypothyroidism, unspecified: Secondary | ICD-10-CM | POA: Diagnosis not present

## 2023-11-17 DIAGNOSIS — Z01818 Encounter for other preprocedural examination: Secondary | ICD-10-CM | POA: Diagnosis not present

## 2023-11-17 DIAGNOSIS — R82998 Other abnormal findings in urine: Secondary | ICD-10-CM | POA: Diagnosis not present

## 2023-11-17 DIAGNOSIS — E611 Iron deficiency: Secondary | ICD-10-CM | POA: Diagnosis not present

## 2023-11-17 DIAGNOSIS — Z853 Personal history of malignant neoplasm of breast: Secondary | ICD-10-CM | POA: Diagnosis not present

## 2023-11-21 DIAGNOSIS — C50919 Malignant neoplasm of unspecified site of unspecified female breast: Secondary | ICD-10-CM | POA: Diagnosis not present

## 2023-11-21 DIAGNOSIS — E139 Other specified diabetes mellitus without complications: Secondary | ICD-10-CM | POA: Diagnosis not present

## 2023-11-21 DIAGNOSIS — E785 Hyperlipidemia, unspecified: Secondary | ICD-10-CM | POA: Diagnosis not present

## 2023-11-21 DIAGNOSIS — E6609 Other obesity due to excess calories: Secondary | ICD-10-CM | POA: Diagnosis not present

## 2023-11-21 DIAGNOSIS — E039 Hypothyroidism, unspecified: Secondary | ICD-10-CM | POA: Diagnosis not present

## 2023-12-17 DIAGNOSIS — Z01812 Encounter for preprocedural laboratory examination: Secondary | ICD-10-CM | POA: Diagnosis not present

## 2024-01-23 DIAGNOSIS — E119 Type 2 diabetes mellitus without complications: Secondary | ICD-10-CM | POA: Diagnosis not present

## 2024-02-02 DIAGNOSIS — E039 Hypothyroidism, unspecified: Secondary | ICD-10-CM | POA: Diagnosis not present

## 2024-02-17 DIAGNOSIS — E1065 Type 1 diabetes mellitus with hyperglycemia: Secondary | ICD-10-CM | POA: Diagnosis not present

## 2024-02-17 DIAGNOSIS — E139 Other specified diabetes mellitus without complications: Secondary | ICD-10-CM | POA: Diagnosis not present

## 2024-02-17 DIAGNOSIS — Z794 Long term (current) use of insulin: Secondary | ICD-10-CM | POA: Diagnosis not present

## 2024-02-17 DIAGNOSIS — R030 Elevated blood-pressure reading, without diagnosis of hypertension: Secondary | ICD-10-CM | POA: Diagnosis not present

## 2024-02-17 DIAGNOSIS — E669 Obesity, unspecified: Secondary | ICD-10-CM | POA: Diagnosis not present
